# Patient Record
Sex: Male | Born: 1950 | Race: White | Hispanic: No | Marital: Married | Smoking: Never smoker
Health system: Southern US, Community
[De-identification: ages and names within clinical notes are randomized; demographics above are authoritative.]

## PROBLEM LIST (undated history)

## (undated) DIAGNOSIS — I10 Essential (primary) hypertension: Secondary | ICD-10-CM

## (undated) DIAGNOSIS — Z950 Presence of cardiac pacemaker: Secondary | ICD-10-CM

## (undated) DIAGNOSIS — E119 Type 2 diabetes mellitus without complications: Secondary | ICD-10-CM

## (undated) HISTORY — PX: STOMACH SURGERY: SHX791

## (undated) HISTORY — PX: BACK SURGERY: SHX140

## (undated) NOTE — *Deleted (*Deleted)
Transition of Care (TOC) - Progression Note    Patient Details  Name: Kevin [name]. MRN: [id number] Date of Birth: 26-Aug-1950  Transition of Care [facility]) CM/SW Contact  Patrice [place], [place] Phone Number: [phone] 12/10/2019, 4:03 PM  Clinical Narrative:     CSW spoke with patient in regards to the 3 bed offers that have come back. Patient explained that he wanted to speak with wife and asked if CSW could follow sf    Barriers to Discharge: Continued Medical Work up  Expected Discharge Plan and Services         Living arrangements for the past 2 months: Single Family Home                                       Social Determinants of Health (SDOH) Interventions    Readmission Risk Interventions No flowsheet data found.

---

## 2004-11-13 ENCOUNTER — Encounter: Admission: RE | Admit: 2004-11-13 | Discharge: 2005-02-11 | Payer: Self-pay | Admitting: Family Medicine

## 2004-11-18 ENCOUNTER — Ambulatory Visit (HOSPITAL_COMMUNITY): Admission: RE | Admit: 2004-11-18 | Discharge: 2004-11-18 | Payer: Self-pay | Admitting: Family Medicine

## 2004-11-25 ENCOUNTER — Ambulatory Visit (HOSPITAL_COMMUNITY): Admission: RE | Admit: 2004-11-25 | Discharge: 2004-11-25 | Payer: Self-pay | Admitting: Gastroenterology

## 2004-12-27 ENCOUNTER — Ambulatory Visit (HOSPITAL_COMMUNITY): Admission: RE | Admit: 2004-12-27 | Discharge: 2004-12-27 | Payer: Self-pay | Admitting: General Surgery

## 2005-02-03 ENCOUNTER — Emergency Department (HOSPITAL_COMMUNITY): Admission: EM | Admit: 2005-02-03 | Discharge: 2005-02-03 | Payer: Self-pay | Admitting: Emergency Medicine

## 2005-02-23 ENCOUNTER — Ambulatory Visit (HOSPITAL_BASED_OUTPATIENT_CLINIC_OR_DEPARTMENT_OTHER): Admission: RE | Admit: 2005-02-23 | Discharge: 2005-02-23 | Payer: Self-pay | Admitting: Otolaryngology

## 2005-03-02 ENCOUNTER — Ambulatory Visit: Payer: Self-pay | Admitting: Internal Medicine

## 2005-09-24 ENCOUNTER — Ambulatory Visit (HOSPITAL_BASED_OUTPATIENT_CLINIC_OR_DEPARTMENT_OTHER): Admission: RE | Admit: 2005-09-24 | Discharge: 2005-09-24 | Payer: Self-pay | Admitting: Orthopedic Surgery

## 2005-09-30 ENCOUNTER — Encounter: Admission: RE | Admit: 2005-09-30 | Discharge: 2005-09-30 | Payer: Self-pay | Admitting: Orthopedic Surgery

## 2011-07-24 ENCOUNTER — Emergency Department (HOSPITAL_COMMUNITY)
Admission: EM | Admit: 2011-07-24 | Discharge: 2011-07-24 | Disposition: A | Discharge status: Home / Self Care | Payer: Managed Care, Other (non HMO) | Attending: Emergency Medicine | Admitting: Emergency Medicine

## 2011-07-24 ENCOUNTER — Emergency Department (HOSPITAL_COMMUNITY): Payer: Managed Care, Other (non HMO)

## 2011-07-24 ENCOUNTER — Encounter (HOSPITAL_COMMUNITY): Payer: Self-pay | Admitting: Emergency Medicine

## 2011-07-24 DIAGNOSIS — Y92009 Unspecified place in unspecified non-institutional (private) residence as the place of occurrence of the external cause: Secondary | ICD-10-CM | POA: Insufficient documentation

## 2011-07-24 DIAGNOSIS — S61509A Unspecified open wound of unspecified wrist, initial encounter: Secondary | ICD-10-CM | POA: Insufficient documentation

## 2011-07-24 DIAGNOSIS — W540XXA Bitten by dog, initial encounter: Secondary | ICD-10-CM | POA: Insufficient documentation

## 2011-07-24 DIAGNOSIS — S51809A Unspecified open wound of unspecified forearm, initial encounter: Secondary | ICD-10-CM | POA: Insufficient documentation

## 2011-07-24 MED ORDER — OXYCODONE-ACETAMINOPHEN 5-325 MG PO TABS
2.0000 | ORAL_TABLET | Freq: Once | ORAL | Status: AC
  Administered 2011-07-24: 2 via ORAL
  Filled 2011-07-24: qty 2

## 2011-07-24 MED ORDER — DOXYCYCLINE HYCLATE 100 MG PO CAPS: 100.0000 mg | ORAL_CAPSULE | Freq: Two times a day (BID) | ORAL | Status: AC

## 2011-07-24 MED ORDER — IBUPROFEN 800 MG PO TABS
800.0000 mg | ORAL_TABLET | Freq: Once | ORAL | Status: AC
  Administered 2011-07-24: 800 mg via ORAL
  Filled 2011-07-24: qty 1

## 2011-07-24 MED ORDER — TETANUS-DIPHTH-ACELL PERTUSSIS 5-2.5-18.5 LF-MCG/0.5 IM SUSP
INTRAMUSCULAR | Status: AC
  Filled 2011-07-24: qty 0.5

## 2011-07-24 MED ORDER — TETANUS-DIPHTH-ACELL PERTUSSIS 5-2.5-18.5 LF-MCG/0.5 IM SUSP
0.5000 mL | Freq: Once | INTRAMUSCULAR | Status: AC
  Administered 2011-07-24: 0.5 mL via INTRAMUSCULAR

## 2011-07-24 MED ORDER — HYDROCODONE-ACETAMINOPHEN 5-325 MG PO TABS: 1.0000 | ORAL_TABLET | ORAL | Status: AC | PRN

## 2011-07-24 NOTE — Discharge Instructions (Signed)
Stab Wound A stab wound can cause infection, bleeding and damage to organs and tissues in the area of the wound. Care must be taken for complete recovery. Much of the time stab wounds can be treated by cleaning them and applying a sterile dressing. Sutures, skin adhesive strips or staples may be used to close some stab wounds.  HOME CARE INSTRUCTIONS  Rest your injury for the next 2-3 days to reduce pain and lessen the risk of infection.   Keep your wound clean and dry and dress as instructed by your caregiver.  You might need a tetanus shot now if:  You have no idea when you had the last one.   You have never had a tetanus shot before.   Your wound had dirt in it.  If you need a tetanus shot, and you choose not to get one, there is a rare chance of getting tetanus. Sickness from tetanus can be serious. If you got a tetanus shot, your arm may swell, get red and warm to the touch at the shot site. This is common and not a problem. SEEK IMMEDIATE MEDICAL CARE IF:  You develop increasing pain, redness or swelling around the wound.   You have nausea or vomiting.   There is increased bleeding from the wound.   You develop numbness or weakness in the injured area. This can be due to damage to an underlying nerve or tendon.   There is a pus-like discharge from the wound.   You have chills or an elevated temperature above 102 F (38.9 C).   You have shortness of breath or fainting.  Document Released: 04/24/2004 Document Revised: 03/06/2011 Document Reviewed: 01/05/2008 [facility] Patient Information 2012 [place], [place].Animal Bite An animal bite can result in a scratch on the skin, deep open cut, puncture of the skin, crush injury, or tearing away of the skin or a body part. Dogs are responsible for most animal bites. Children are bitten more often than adults. An animal bite can range from very mild to more serious. A small bite from your house pet is no cause for alarm. However, some  animal bites can become infected or injure a bone or other tissue. You must seek medical care if:  The skin is broken and bleeding does not slow down or stop after 15 minutes.   The puncture is deep and difficult to clean (such as a cat bite).   Pain, warmth, redness, or pus develops around the wound.   The bite is from a stray animal or rodent. There may be a risk of rabies infection.   The bite is from a snake, raccoon, skunk, fox, coyote, or bat. There may be a risk of rabies infection.   The person bitten has a chronic illness such as diabetes, liver disease, or cancer, or the person takes medicine that lowers the immune system.   There is concern about the location and severity of the bite.  It is important to clean and protect an animal bite wound right away to prevent infection. Follow these steps:  Clean the wound with plenty of water and soap.   Apply an antibiotic cream.   Apply gentle pressure over the wound with a clean towel or gauze to slow or stop bleeding.   Elevate the affected area above the heart to help stop any bleeding.   Seek medical care. Getting medical care within 8 hours of the animal bite leads to the best possible outcome.  DIAGNOSIS  Your caregiver  will most likely:  Take a detailed history of the animal and the bite injury.   Perform a wound exam.   Take your medical history.  Blood tests or X-rays may be performed. Sometimes, infected bite wounds are cultured and sent to a lab to identify the infectious bacteria.  TREATMENT  Medical treatment will depend on the location and type of animal bite as well as the patient's medical history. Treatment may include:  Wound care, such as cleaning and flushing the wound with saline solution, bandaging, and elevating the affected area.   Antibiotics.   Tetanus immunization.   Rabies immunization.   Leaving the wound open to heal. This is often done with animal bites, due to the high risk of infection.  However, in certain cases, wound closure with stitches, wound adhesive, skin adhesive strips, or staples may be used.  Infected bites that are left untreated may require intravenous (IV) antibiotics and surgical treatment in the hospital. HOME CARE INSTRUCTIONS  Follow your caregiver's instructions for wound care.   Take all medicines as directed.   If your caregiver prescribes antibiotics, take them as directed. Finish them even if you start to feel better.   Follow up with your caregiver for further exams or immunizations as directed.  You may need a tetanus shot if:  You cannot remember when you had your last tetanus shot.   You have never had a tetanus shot.   The injury broke your skin.  If you get a tetanus shot, your arm may swell, get red, and feel warm to the touch. This is common and not a problem. If you need a tetanus shot and you choose not to have one, there is a rare chance of getting tetanus. Sickness from tetanus can be serious. SEEK MEDICAL CARE IF:  You notice warmth, redness, soreness, swelling, pus discharge, or a bad smell coming from the wound.   You have a red line on the skin coming from the wound.   You have a fever, chills, or a general ill feeling.   You have nausea or vomiting.   You have continued or worsening pain.   You have trouble moving the injured part.   You have other questions or concerns.  MAKE SURE YOU:  Understand these instructions.   Will watch your condition.   Will get help right away if you are not doing well or get worse.  Document Released: 12/03/2010 Document Revised: 03/06/2011 Document Reviewed: 12/03/2010 [facility] Patient Information 2012 [place], [place].Laceration Care, Adult A laceration is a cut or lesion that goes through all layers of the skin and into the tissue just beneath the skin. TREATMENT  Some lacerations may not require closure. Some lacerations may not be able to be closed due to an increased risk of  infection. It is important to see your caregiver as soon as possible after an injury to minimize the risk of infection and maximize the opportunity for successful closure. If closure is appropriate, pain medicines may be given, if needed. The wound will be cleaned to help prevent infection. Your caregiver will use stitches (sutures), staples, wound glue (adhesive), or skin adhesive strips to repair the laceration. These tools bring the skin edges together to allow for faster healing and a better cosmetic outcome. However, all wounds will heal with a scar. Once the wound has healed, scarring can be minimized by covering the wound with sunscreen during the day for 1 full year. HOME CARE INSTRUCTIONS  For sutures or staples:  Keep the wound clean and dry.   If you were given a bandage (dressing), you should change it at least once a day. Also, change the dressing if it becomes wet or dirty, or as directed by your caregiver.   Wash the wound with soap and water 2 times a day. Rinse the wound off with water to remove all soap. Pat the wound dry with a clean towel.   After cleaning, apply a thin layer of the antibiotic ointment as recommended by your caregiver. This will help prevent infection and keep the dressing from sticking.   You may shower as usual after the first 24 hours. Do not soak the wound in water until the sutures are removed.   Only take over-the-counter or prescription medicines for pain, discomfort, or fever as directed by your caregiver.   Get your sutures or staples removed as directed by your caregiver.  For skin adhesive strips:  Keep the wound clean and dry.   Do not get the skin adhesive strips wet. You may bathe carefully, using caution to keep the wound dry.   If the wound gets wet, pat it dry with a clean towel.   Skin adhesive strips will fall off on their own. You may trim the strips as the wound heals. Do not remove skin adhesive strips that are still stuck to the  wound. They will fall off in time.  For wound adhesive:  You may briefly wet your wound in the shower or bath. Do not soak or scrub the wound. Do not swim. Avoid periods of heavy perspiration until the skin adhesive has fallen off on its own. After showering or bathing, gently pat the wound dry with a clean towel.   Do not apply liquid medicine, cream medicine, or ointment medicine to your wound while the skin adhesive is in place. This may loosen the film before your wound is healed.   If a dressing is placed over the wound, be careful not to apply tape directly over the skin adhesive. This may cause the adhesive to be pulled off before the wound is healed.   Avoid prolonged exposure to sunlight or tanning lamps while the skin adhesive is in place. Exposure to ultraviolet light in the first year will darken the scar.   The skin adhesive will usually remain in place for 5 to 10 days, then naturally fall off the skin. Do not pick at the adhesive film.  You may need a tetanus shot if:  You cannot remember when you had your last tetanus shot.   You have never had a tetanus shot.  If you get a tetanus shot, your arm may swell, get red, and feel warm to the touch. This is common and not a problem. If you need a tetanus shot and you choose not to have one, there is a rare chance of getting tetanus. Sickness from tetanus can be serious. SEEK MEDICAL CARE IF:   You have redness, swelling, or increasing pain in the wound.   You see a red line that goes away from the wound.   You have yellowish-white fluid (pus) coming from the wound.   You have a fever.   You notice a bad smell coming from the wound or dressing.   Your wound breaks open before or after sutures have been removed.   You notice something coming out of the wound such as wood or glass.   Your wound is on your hand or foot and you cannot  move a finger or toe.  SEEK IMMEDIATE MEDICAL CARE IF:   Your pain is not controlled with  prescribed medicine.   You have severe swelling around the wound causing pain and numbness or a change in color in your arm, hand, leg, or foot.   Your wound splits open and starts bleeding.   You have worsening numbness, weakness, or loss of function of any joint around or beyond the wound.   You develop painful lumps near the wound or on the skin anywhere on your body.  MAKE SURE YOU:   Understand these instructions.   Will watch your condition.   Will get help right away if you are not doing well or get worse.  Document Released: 03/17/2005 Document Revised: 03/06/2011 Document Reviewed: 09/10/2010 [facility] Patient Information 2012 [place], [place].

## 2011-07-24 NOTE — ED Provider Notes (Signed)
History     CSN: [id number]  Arrival date & time 07/24/11  1642   First MD Initiated Contact with Patient 07/24/11 1748      No chief complaint on file.   (Consider location/radiation/quality/duration/timing/severity/associated sxs/prior treatment) HPI  Patient presents to the ED with complaints of being attacked by a dog. The dog is his friends who is out of town for two weeks,. The dog is not up to date on his shots. The patient called animal control to come get the dog so that it can be quarantined for rabies. He states that he went to the house and the dog had broke free from his chain and was scared. He was trying to feed the dog when it attacked him. He received multiple puncture wounds to his right wrist and left forearm, as well as an abrasion to his right abdominal area. Bleeding is controlled at this time. The pain is moderate and is not associated with any other symptoms. The incident happened just prior to arrival. NAD and VSS.   No past medical history on file.  Past Surgical History  Procedure Date  . Back surgery   . Stomach surgery     History reviewed. No pertinent family history.  History  Substance Use Topics  . Smoking status: Not on file  . Smokeless tobacco: Not on file  . Alcohol Use:       Review of Systems   HEENT: denies blurry vision or change in hearing PULMONARY: Denies difficulty breathing and SOB CARDIAC: denies chest pain or heart palpitations MUSCULOSKELETAL:  denies being unable to ambulate ABDOMEN AL: denies abdominal pain GU: denies loss of bowel or urinary control NEURO: denies numbness and tingling in extremities   Allergies  Penicillins and Percodan  Home Medications   Current Outpatient Rx  Name Route Sig Dispense Refill  . IBUPROFEN 200 MG PO TABS Oral Take 600-800 mg by mouth every 8 (eight) hours as needed. For pain.    Marland Kitchen DOXYCYCLINE HYCLATE 100 MG PO CAPS Oral Take 1 capsule (100 mg total) by mouth 2 (two) times  daily. 20 capsule 0  . HYDROCODONE-ACETAMINOPHEN 5-325 MG PO TABS Oral Take 1 tablet by mouth every 4 (four) hours as needed for pain. 6 tablet 0    BP 140/88  Pulse 91  Temp(Src) 98 F (36.7 C) (Oral)  Resp 18  Ht 6' (1.829 m)  Wt 300 lb (136.079 kg)  BMI 40.69 kg/m2  SpO2 95%  Physical Exam  Nursing note and vitals reviewed. Constitutional: He appears well-developed and well-nourished. No distress.  HENT:  Head: Normocephalic and atraumatic.  Eyes: Pupils are equal, round, and reactive to light.  Neck: Normal range of motion. Neck supple.  Cardiovascular: Normal rate and regular rhythm.   Pulmonary/Chest: Effort normal.  Abdominal: Soft.  Musculoskeletal:       Arms:      Hands: Neurological: He is alert.  Skin: Skin is warm and dry.    ED Course  Procedures (including critical care time)  Labs Reviewed - No data to display Dg Forearm Left  07/24/2011  *RADIOLOGY REPORT*  Clinical Data: Dog bite with laceration  LEFT FOREARM - 2 VIEW  Comparison: None.  Findings: Negative for fracture.  No foreign body is identified. Small laceration is noted.  IMPRESSION: Negative for fracture or foreign body.  Original Report Authenticated By: Camelia Phenes, M.D.   Dg Wrist Complete Right  07/24/2011  *RADIOLOGY REPORT*  Clinical Data: Dog bite base of  thumb and wrist  RIGHT WRIST - COMPLETE 3+ VIEW  Comparison: None.  Findings: Negative for fracture.  No foreign bodies identified. Mild degenerative changes in the wrist.  IMPRESSION: No acute abnormality.  Original Report Authenticated By: Camelia Phenes, M.D.     1. Dog bite       MDM  Pts wounds topically cleaned with betadine. Then each wound was irrigated with normal saline using a syringe and IV catheter. No foreign bodies removed no red flags symptoms noted. Pt started on doxycycline to prevent infection. Pt referred to [facility] the Hand Doctor for follow-up. Instructions on wound care given to patient and he voices  understanding on signs and symptoms to watch for which warrant return to ED.  Pt will begin rabies series if dog can not be quarantined by tomorrow.   Pt has been advised of the symptoms that warrant their return to the ED. Patient has voiced understanding and has agreed to follow-up with the PCP or specialist.         Dorthula Matas, PA 07/24/11 [phone]

## 2011-07-28 NOTE — ED Provider Notes (Signed)
Medical screening examination/treatment/procedure(s) were performed by non-physician practitioner and as supervising physician I was immediately available for consultation/collaboration.   Mystic Labo, MD 07/28/11 0733 

## 2013-05-23 DIAGNOSIS — G4733 Obstructive sleep apnea (adult) (pediatric): Secondary | ICD-10-CM | POA: Insufficient documentation

## 2013-06-06 DIAGNOSIS — I10 Essential (primary) hypertension: Secondary | ICD-10-CM | POA: Insufficient documentation

## 2013-09-06 DIAGNOSIS — Z9109 Other allergy status, other than to drugs and biological substances: Secondary | ICD-10-CM | POA: Insufficient documentation

## 2013-09-22 DIAGNOSIS — M1990 Unspecified osteoarthritis, unspecified site: Secondary | ICD-10-CM | POA: Insufficient documentation

## 2013-09-26 DIAGNOSIS — E291 Testicular hypofunction: Secondary | ICD-10-CM | POA: Insufficient documentation

## 2014-05-05 DIAGNOSIS — R4189 Other symptoms and signs involving cognitive functions and awareness: Secondary | ICD-10-CM | POA: Insufficient documentation

## 2014-10-10 DIAGNOSIS — E785 Hyperlipidemia, unspecified: Secondary | ICD-10-CM | POA: Insufficient documentation

## 2016-08-01 DIAGNOSIS — I77819 Aortic ectasia, unspecified site: Secondary | ICD-10-CM | POA: Insufficient documentation

## 2016-08-01 DIAGNOSIS — I700 Atherosclerosis of aorta: Secondary | ICD-10-CM | POA: Insufficient documentation

## 2017-02-27 DIAGNOSIS — R0789 Other chest pain: Secondary | ICD-10-CM | POA: Insufficient documentation

## 2017-02-27 DIAGNOSIS — G43109 Migraine with aura, not intractable, without status migrainosus: Secondary | ICD-10-CM | POA: Insufficient documentation

## 2017-03-06 DIAGNOSIS — R7989 Other specified abnormal findings of blood chemistry: Secondary | ICD-10-CM | POA: Insufficient documentation

## 2017-04-04 ENCOUNTER — Other Ambulatory Visit: Payer: Self-pay

## 2017-04-04 ENCOUNTER — Emergency Department (HOSPITAL_BASED_OUTPATIENT_CLINIC_OR_DEPARTMENT_OTHER)
Admission: EM | Admit: 2017-04-04 | Discharge: 2017-04-04 | Disposition: A | Discharge status: Home / Self Care | Payer: Medicare Other | Attending: Emergency Medicine | Admitting: Emergency Medicine

## 2017-04-04 ENCOUNTER — Encounter (HOSPITAL_BASED_OUTPATIENT_CLINIC_OR_DEPARTMENT_OTHER): Payer: Self-pay | Admitting: Emergency Medicine

## 2017-04-04 ENCOUNTER — Emergency Department (HOSPITAL_BASED_OUTPATIENT_CLINIC_OR_DEPARTMENT_OTHER): Payer: Medicare Other

## 2017-04-04 DIAGNOSIS — M79605 Pain in left leg: Secondary | ICD-10-CM | POA: Diagnosis present

## 2017-04-04 DIAGNOSIS — E119 Type 2 diabetes mellitus without complications: Secondary | ICD-10-CM | POA: Diagnosis not present

## 2017-04-04 DIAGNOSIS — I10 Essential (primary) hypertension: Secondary | ICD-10-CM | POA: Insufficient documentation

## 2017-04-04 DIAGNOSIS — Z79899 Other long term (current) drug therapy: Secondary | ICD-10-CM | POA: Diagnosis not present

## 2017-04-04 DIAGNOSIS — Z7984 Long term (current) use of oral hypoglycemic drugs: Secondary | ICD-10-CM | POA: Insufficient documentation

## 2017-04-04 DIAGNOSIS — Z7982 Long term (current) use of aspirin: Secondary | ICD-10-CM | POA: Insufficient documentation

## 2017-04-04 DIAGNOSIS — L03116 Cellulitis of left lower limb: Secondary | ICD-10-CM | POA: Diagnosis not present

## 2017-04-04 HISTORY — DX: Essential (primary) hypertension: I10

## 2017-04-04 HISTORY — DX: Type 2 diabetes mellitus without complications: E11.9

## 2017-04-04 LAB — CBC WITH DIFFERENTIAL/PLATELET
Basophils Absolute: 0 10*3/uL (ref 0.0–0.1)
Basophils Relative: 1 %
Eosinophils Absolute: 0.3 10*3/uL (ref 0.0–0.7)
Eosinophils Relative: 3 %
HCT: 42.3 % (ref 39.0–52.0)
Hemoglobin: 13.7 g/dL (ref 13.0–17.0)
Lymphocytes Relative: 23 %
Lymphs Abs: 2 10*3/uL (ref 0.7–4.0)
MCH: 30.6 pg (ref 26.0–34.0)
MCHC: 32.4 g/dL (ref 30.0–36.0)
MCV: 94.4 fL (ref 78.0–100.0)
Monocytes Absolute: 0.9 10*3/uL (ref 0.1–1.0)
Monocytes Relative: 10 %
Neutro Abs: 5.4 10*3/uL (ref 1.7–7.7)
Neutrophils Relative %: 63 %
Platelets: 317 10*3/uL (ref 150–400)
RBC: 4.48 MIL/uL (ref 4.22–5.81)
RDW: 14.3 % (ref 11.5–15.5)
WBC: 8.5 10*3/uL (ref 4.0–10.5)

## 2017-04-04 LAB — URINALYSIS, ROUTINE W REFLEX MICROSCOPIC
Bilirubin Urine: NEGATIVE
Glucose, UA: NEGATIVE mg/dL
Hgb urine dipstick: NEGATIVE
Ketones, ur: 15 mg/dL — AB
Leukocytes, UA: NEGATIVE
Nitrite: NEGATIVE
Protein, ur: NEGATIVE mg/dL
Specific Gravity, Urine: 1.02 (ref 1.005–1.030)
pH: 6 (ref 5.0–8.0)

## 2017-04-04 LAB — BASIC METABOLIC PANEL
Anion gap: 7 (ref 5–15)
BUN: 12 mg/dL (ref 6–20)
CO2: 26 mmol/L (ref 22–32)
Calcium: 8.8 mg/dL — ABNORMAL LOW (ref 8.9–10.3)
Chloride: 101 mmol/L (ref 101–111)
Creatinine, Ser: 0.87 mg/dL (ref 0.61–1.24)
GFR calc Af Amer: >60 mL/min (ref >60)
GFR calc non Af Amer: >60 mL/min (ref >60)
Glucose, Bld: 113 mg/dL — ABNORMAL HIGH (ref 65–99)
Potassium: 3.7 mmol/L (ref 3.5–5.1)
Sodium: 134 mmol/L — ABNORMAL LOW (ref 135–145)

## 2017-04-04 MED ORDER — CEPHALEXIN 500 MG PO CAPS: 500.0000 mg | ORAL_CAPSULE | Freq: Four times a day (QID) | ORAL | 0 refills | Status: AC

## 2017-04-04 NOTE — ED Notes (Signed)
ED Provider at bedside. 

## 2017-04-04 NOTE — ED Provider Notes (Signed)
[facility] EMERGENCY DEPARTMENT Provider Note   CSN: [id number] Arrival date & time: 04/04/17  1431     History   Chief Complaint Chief Complaint  Patient presents with  . Leg Pain    HPI Kevin [name]. is a 67 y.o. male.  The history is provided by the patient. No language interpreter was used.  Leg Pain      Kevin [name]. is a 67 y.o. male who presents to the Emergency Department complaining of leg pain.  He reports 3 days of pain and swelling to the left leg.  He denies any initial injuries but he did injure it after the pain, swelling and redness developed.  After the leg became painful and swollen and he hit it on a hand truck.  No reports of fevers but it feels very warm in the leg.  He has been dealing with chronic bronchitis and cough for several months.  No vomiting, diarrhea.  He has mild sore throat.  Symptoms are moderate and constant in nature.  No prior similar symptoms.  He takes a baby aspirin daily.  He does have a history of diabetes and high blood pressure.  Past Medical History:  Diagnosis Date  . Diabetes mellitus without complication (HCC)   . Hypertension     There are no active problems to display for this patient.   Past Surgical History:  Procedure Laterality Date  . BACK SURGERY    . STOMACH SURGERY         Home Medications    Prior to Admission medications   Medication Sig Start Date End Date Taking? Authorizing Provider  amlodipine-benazepril (LOTREL) 2.5-10 MG capsule Take 1 capsule by mouth daily.   Yes [provider]  aspirin EC 81 MG tablet Take 81 mg by mouth daily.   Yes [provider]  atorvastatin (LIPITOR) 40 MG tablet Take 40 mg by mouth daily.   Yes [provider]  metFORMIN (GLUCOPHAGE) 500 MG tablet Take by mouth 2 (two) times daily with a meal.   Yes [provider]  omeprazole (PRILOSEC) 20 MG capsule Take 20 mg by mouth daily.   Yes [provider]    tadalafil (CIALIS) 10 MG tablet Take 10 mg by mouth daily as needed for erectile dysfunction.   Yes [provider]  cephALEXin (KEFLEX) 500 MG capsule Take 1 capsule (500 mg total) by mouth 4 (four) times daily for 7 days. 04/04/17 04/11/17  Tilden Fossa, MD  ibuprofen (ADVIL,MOTRIN) 200 MG tablet Take 600-800 mg by mouth every 8 (eight) hours as needed. For pain.    [provider]    Family History No family history on file.  Social History Social History   Tobacco Use  . Smoking status: Never Smoker  . Smokeless tobacco: Never Used  Substance Use Topics  . Alcohol use: No    Frequency: Never  . Drug use: Not on file     Allergies   Penicillins and Percodan [oxycodone-aspirin]   Review of Systems Review of Systems  All other systems reviewed and are negative.    Physical Exam Updated Vital Signs BP (!) 147/76 (BP Location: Right Arm)   Pulse 62   Temp 98.7 F (37.1 C) (Oral)   Resp 20   Ht 5\' 10"  (1.778 m)   Wt 133.8 kg (295 lb)   SpO2 100%   BMI 42.33 kg/m   Physical Exam  Constitutional: He is oriented to person, place,  and time. He appears well-developed and well-nourished.  HENT:  Head: Normocephalic and atraumatic.  Cardiovascular: Normal rate and regular rhythm.  Pulmonary/Chest: Effort normal and breath sounds normal. No respiratory distress.  Abdominal: Soft. There is no tenderness. There is no rebound and no guarding.  Musculoskeletal:  1+ pitting edema to the right lower extremity.  2+ pitting edema to the left lower extremity.  There is erythema and tenderness throughout the left shin and calf.  There is no erythema to the foot.  Neurological: He is alert and oriented to person, place, and time.  Skin: Skin is warm and dry.  Psychiatric: He has a normal mood and affect. His behavior is normal.  Nursing note and vitals reviewed.    ED Treatments / Results  Labs (all labs ordered are listed, but only abnormal results are  displayed) Labs Reviewed  BASIC METABOLIC PANEL - Abnormal; Notable for the following components:      Result Value   Sodium 134 (*)    Glucose, Bld 113 (*)    Calcium 8.8 (*)    All other components within normal limits  URINALYSIS, ROUTINE W REFLEX MICROSCOPIC - Abnormal; Notable for the following components:   Ketones, ur 15 (*)    All other components within normal limits  CBC WITH DIFFERENTIAL/PLATELET    EKG  EKG Interpretation None       Radiology [place] Venous Img Lower Unilateral Left  Result Date: 04/04/2017 CLINICAL DATA:  LEFT lower leg discomfort, swelling, and redness for 3-4 days, also struck LEFT she in against trailer hitch 2 days ago, history hypertension, diabetes mellitus, obesity EXAM: LEFT LOWER EXTREMITY VENOUS DOPPLER ULTRASOUND TECHNIQUE: Gray-scale sonography with graded compression, as well as color Doppler and duplex ultrasound were performed to evaluate the lower extremity deep venous systems from the level of the common femoral vein and including the common femoral, femoral, profunda femoral, popliteal and calf veins including the posterior tibial, peroneal and gastrocnemius veins when visible. The superficial great saphenous vein was also interrogated. Spectral Doppler was utilized to evaluate flow at rest and with distal augmentation maneuvers in the common femoral, femoral and popliteal veins. COMPARISON:  09/30/2005 FINDINGS: Contralateral Common Femoral Vein: Respiratory phasicity is normal and symmetric with the symptomatic side. No evidence of thrombus. Normal compressibility. Common Femoral Vein: No evidence of thrombus. Normal compressibility, respiratory phasicity and response to augmentation. Saphenofemoral Junction: No evidence of thrombus. Normal compressibility and flow on color Doppler imaging. Profunda Femoral Vein: No evidence of thrombus. Normal compressibility and flow on color Doppler imaging. Femoral Vein: No evidence of thrombus. Normal  compressibility, respiratory phasicity and response to augmentation. Popliteal Vein: No evidence of thrombus. Normal compressibility, respiratory phasicity and response to augmentation. Calf Veins: Inadequate visualization of calf veins due to body habitus and soft tissue swelling. Superficial Great Saphenous Vein: No evidence of thrombus. Normal compressibility. Venous Reflux:  None. Other Findings:  Scattered soft tissue edema at calf. IMPRESSION: No evidence of deep venous thrombosis in the LEFT lower extremity. Electronically Signed   By: Ulyses SouthwardMark  Boles M.D.   On: 04/04/2017 16:05    Procedures Procedures (including critical care time)  Medications Ordered in ED Medications - No data to display   Initial Impression / Assessment and Plan / ED Course  I have reviewed the triage vital signs and the nursing notes.  Pertinent labs & imaging results that were available during my care of the patient were reviewed by me and considered in my medical decision making (see  chart for details).     Patient here with swelling and pain to the left leg.  He is well perfused on examination with no evidence of acute DVT, limb ischemia, CHF, abscess.  Examination is concerning for developing cellulitis.  He has no concerning features for sepsis.  Counseled patient on home care for cellulitis.  Discussed outpatient follow-up as well as return precautions.  Final Clinical Impressions(s) / ED Diagnoses   Final diagnoses:  Cellulitis of left lower extremity    ED Discharge Orders        Ordered    cephALEXin (KEFLEX) 500 MG capsule  4 times daily     04/04/17 1856       Tilden Fossa, MD 04/04/17 1905

## 2017-04-04 NOTE — ED Triage Notes (Signed)
L leg pain and swelling x 3 days. Redness noted, hot to touch.

## 2018-03-17 DIAGNOSIS — Z789 Other specified health status: Secondary | ICD-10-CM | POA: Insufficient documentation

## 2018-03-17 DIAGNOSIS — L853 Xerosis cutis: Secondary | ICD-10-CM | POA: Insufficient documentation

## 2018-08-30 DIAGNOSIS — J45909 Unspecified asthma, uncomplicated: Secondary | ICD-10-CM | POA: Insufficient documentation

## 2018-10-17 DIAGNOSIS — Z6841 Body Mass Index (BMI) 40.0 and over, adult: Secondary | ICD-10-CM | POA: Insufficient documentation

## 2019-04-22 DIAGNOSIS — I441 Atrioventricular block, second degree: Secondary | ICD-10-CM | POA: Insufficient documentation

## 2019-05-02 HISTORY — PX: INSERT / REPLACE / REMOVE PACEMAKER: SUR710

## 2019-05-13 ENCOUNTER — Encounter (HOSPITAL_BASED_OUTPATIENT_CLINIC_OR_DEPARTMENT_OTHER): Payer: Self-pay | Admitting: Emergency Medicine

## 2019-05-13 ENCOUNTER — Emergency Department (HOSPITAL_BASED_OUTPATIENT_CLINIC_OR_DEPARTMENT_OTHER)
Admission: EM | Admit: 2019-05-13 | Discharge: 2019-05-13 | Disposition: A | Discharge status: Home / Self Care | Payer: Medicare Other | Attending: Emergency Medicine | Admitting: Emergency Medicine

## 2019-05-13 ENCOUNTER — Other Ambulatory Visit: Payer: Self-pay

## 2019-05-13 DIAGNOSIS — Z7984 Long term (current) use of oral hypoglycemic drugs: Secondary | ICD-10-CM | POA: Insufficient documentation

## 2019-05-13 DIAGNOSIS — Z880 Allergy status to penicillin: Secondary | ICD-10-CM | POA: Diagnosis not present

## 2019-05-13 DIAGNOSIS — T783XXA Angioneurotic edema, initial encounter: Secondary | ICD-10-CM

## 2019-05-13 DIAGNOSIS — Z79899 Other long term (current) drug therapy: Secondary | ICD-10-CM | POA: Diagnosis not present

## 2019-05-13 DIAGNOSIS — I10 Essential (primary) hypertension: Secondary | ICD-10-CM | POA: Diagnosis not present

## 2019-05-13 DIAGNOSIS — R220 Localized swelling, mass and lump, head: Secondary | ICD-10-CM | POA: Diagnosis present

## 2019-05-13 DIAGNOSIS — Z885 Allergy status to narcotic agent status: Secondary | ICD-10-CM | POA: Insufficient documentation

## 2019-05-13 DIAGNOSIS — T464X5A Adverse effect of angiotensin-converting-enzyme inhibitors, initial encounter: Secondary | ICD-10-CM

## 2019-05-13 DIAGNOSIS — Z7982 Long term (current) use of aspirin: Secondary | ICD-10-CM | POA: Insufficient documentation

## 2019-05-13 DIAGNOSIS — E119 Type 2 diabetes mellitus without complications: Secondary | ICD-10-CM | POA: Insufficient documentation

## 2019-05-13 LAB — CBG MONITORING, ED: Glucose-Capillary: 94 mg/dL (ref 70–99)

## 2019-05-13 MED ORDER — DIPHENHYDRAMINE HCL 50 MG/ML IJ SOLN
25.0000 mg | Freq: Once | INTRAMUSCULAR | Status: AC
  Administered 2019-05-13: 25 mg via INTRAVENOUS
  Filled 2019-05-13: qty 1

## 2019-05-13 MED ORDER — SODIUM CHLORIDE 0.9 % IV SOLN: Freq: Once | INTRAVENOUS | Status: AC

## 2019-05-13 MED ORDER — AMLODIPINE BESYLATE 5 MG PO TABS: 5.0000 mg | ORAL_TABLET | Freq: Every day | ORAL | 0 refills | Status: DC

## 2019-05-13 MED ORDER — FAMOTIDINE IN NACL 20-0.9 MG/50ML-% IV SOLN
20.0000 mg | Freq: Once | INTRAVENOUS | Status: AC
  Administered 2019-05-13: 07:00:00 20 mg via INTRAVENOUS
  Filled 2019-05-13: qty 50

## 2019-05-13 MED ORDER — METHYLPREDNISOLONE SODIUM SUCC 125 MG IJ SOLR
60.0000 mg | Freq: Once | INTRAMUSCULAR | Status: AC
  Administered 2019-05-13: 07:00:00 60 mg via INTRAVENOUS
  Filled 2019-05-13: qty 2

## 2019-05-13 MED ORDER — PREDNISONE 50 MG PO TABS: 50.0000 mg | ORAL_TABLET | Freq: Every day | ORAL | 0 refills | Status: DC

## 2019-05-13 NOTE — ED Provider Notes (Signed)
Patient seen by Dr. Preston Fleeting.  Please see his note.  Patient reexamined.  He still has some lip edema but denies any tongue swelling, difficulty breathing or swallowing.  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  Plan on discharge.  Discontinue benazepril.   Linwood Dibbles, MD 05/13/19 [phone]

## 2019-05-13 NOTE — ED Triage Notes (Signed)
Patient presents with complaints of swelling to lower lip; states onset approx 0300 this am with difficulty breathing.

## 2019-05-13 NOTE — ED Provider Notes (Signed)
[facility] EMERGENCY DEPARTMENT Provider Note   CSN: [id number] Arrival date & time: 05/13/19  6948   History Chief Complaint  Patient presents with  . Allergic Reaction    Kevin [name]. is a 69 y.o. male.  The history is provided by the patient.  Allergic Reaction He has history of hypertension, diabetes and comes in because of swelling of the lower lip.  Swelling was noted at 3 AM when it woke him up.  He states that he is having a little difficulty swallowing but no difficulty breathing.  He has never had anything like this before.  He does take benazepril for blood pressure.  Past Medical History:  Diagnosis Date  . Diabetes mellitus without complication (HCC)   . Hypertension     There are no problems to display for this patient.   Past Surgical History:  Procedure Laterality Date  . BACK SURGERY    . STOMACH SURGERY         No family history on file.  Social History   Tobacco Use  . Smoking status: Never Smoker  . Smokeless tobacco: Never Used  Substance Use Topics  . Alcohol use: No  . Drug use: Not on file    Home Medications Prior to Admission medications   Medication Sig Start Date End Date Taking? Authorizing Provider  amlodipine-benazepril (LOTREL) 2.5-10 MG capsule Take 1 capsule by mouth daily.    [provider]  aspirin EC 81 MG tablet Take 81 mg by mouth daily.    [provider]  atorvastatin (LIPITOR) 40 MG tablet Take 40 mg by mouth daily.    [provider]  ibuprofen (ADVIL,MOTRIN) 200 MG tablet Take 600-800 mg by mouth every 8 (eight) hours as needed. For pain.    [provider]  metFORMIN (GLUCOPHAGE) 500 MG tablet Take by mouth 2 (two) times daily with a meal.    [provider]  omeprazole (PRILOSEC) 20 MG capsule Take 20 mg by mouth daily.    [provider]  tadalafil (CIALIS) 10 MG tablet Take 10 mg by mouth daily as needed for erectile dysfunction.    [provider]    Allergies    Penicillins and Percodan [oxycodone-aspirin]  Review of Systems   Review of Systems  All other systems reviewed and are negative.   Physical Exam Updated Vital Signs BP (!) 154/81 (BP Location: Right Arm)   Pulse 70   Temp 97.9 F (36.6 C) (Oral)   Resp 18   Ht 6' (1.829 m)   Wt 133 kg   SpO2 98%   BMI 39.77 kg/m   Physical Exam Vitals and nursing note reviewed.   69 year old male, resting comfortably and in no acute distress. Vital signs are significant for elevated blood pressure. Oxygen saturation is 98%, which is normal. Head is normocephalic and atraumatic. PERRLA, EOMI. moderate angioedema noted of the lower lip.  There is no swelling of the tongue or sublingual tissue.  There is no edema of the uvula.  There is no pooling of secretions.  Phonation is normal. Neck is nontender and supple without adenopathy or JVD. Back is nontender and there is no CVA tenderness. Lungs are clear without rales, wheezes, or rhonchi. Chest is nontender. Heart has regular rate and rhythm without murmur. Abdomen is soft, flat, nontender without masses or hepatosplenomegaly and peristalsis is normoactive. Extremities have 2+ edema, full range of motion is present. Skin is warm and dry without  rash. Neurologic: Mental status is normal, cranial nerves are intact, there are no motor or sensory deficits.  ED Results / Procedures / Treatments   Labs (all labs ordered are listed, but only abnormal results are displayed) Labs Reviewed - No data to display  Procedures Procedures  CRITICAL CARE Performed by: Delora Fuel Total critical care time: 35 minutes Critical care time was exclusive of separately billable procedures and treating other patients. Critical care was necessary to treat or prevent imminent or life-threatening deterioration. Critical care was time spent personally by me on the following activities: development of treatment plan with patient  and/or surrogate as well as nursing, discussions with consultants, evaluation of patient's response to treatment, examination of patient, obtaining history from patient or surrogate, ordering and performing treatments and interventions, ordering and review of laboratory studies, ordering and review of radiographic studies, pulse oximetry and re-evaluation of patient's condition.  Medications Ordered in ED Medications  diphenhydrAMINE (BENADRYL) injection 25 mg (has no administration in time range)  famotidine (PEPCID) IVPB 20 mg premix (has no administration in time range)  methylPREDNISolone sodium succinate (SOLU-MEDROL) 125 mg/2 mL injection 60 mg (has no administration in time range)    ED Course  I have reviewed the triage vital signs and the nursing notes.  Pertinent lab results that were available during my care of the patient were reviewed by me and considered in my medical decision making (see chart for details).  MDM Rules/Calculators/A&P ACE inhibitor induced angioedema of the lower lip.  No evidence of airway compromise at this time.  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  He is given methylprednisolone, diphenhydramine, famotidine and will be observed in the ED.  Case is signed out to Dr. Tomi Bamberger.  Final Clinical Impression(s) / ED Diagnoses Final diagnoses:  Angioedema due to angiotensin converting enzyme inhibitor (ACE-I)  Elevated blood pressure reading with diagnosis of hypertension    Rx / DC Orders ED Discharge Orders    None       Delora Fuel, MD 37/16/96 [phone]

## 2019-05-13 NOTE — Discharge Instructions (Addendum)
Stop taking your benazepril.   Follow up with your doctor to discuss alternative medications for your blood pressure.  In the interim you can take norvasc alone.  You can also take an over the counter antihistamine such as zyrtec or claritin.

## 2019-06-03 DIAGNOSIS — R001 Bradycardia, unspecified: Secondary | ICD-10-CM | POA: Insufficient documentation

## 2019-11-22 ENCOUNTER — Encounter (HOSPITAL_COMMUNITY): Payer: Self-pay | Admitting: Student

## 2019-11-22 ENCOUNTER — Other Ambulatory Visit: Payer: Self-pay

## 2019-11-22 ENCOUNTER — Emergency Department (HOSPITAL_COMMUNITY): Payer: Medicare Other

## 2019-11-22 ENCOUNTER — Inpatient Hospital Stay (HOSPITAL_COMMUNITY): Payer: Medicare Other

## 2019-11-22 ENCOUNTER — Inpatient Hospital Stay (HOSPITAL_COMMUNITY)
Admission: EM | Admit: 2019-11-22 | Discharge: 2019-12-13 | DRG: 871 | Disposition: A | Discharge status: Skilled Nursing Facility | Payer: Medicare Other | Attending: Internal Medicine | Admitting: Internal Medicine

## 2019-11-22 DIAGNOSIS — J9622 Acute and chronic respiratory failure with hypercapnia: Secondary | ICD-10-CM | POA: Diagnosis present

## 2019-11-22 DIAGNOSIS — Z803 Family history of malignant neoplasm of breast: Secondary | ICD-10-CM

## 2019-11-22 DIAGNOSIS — T827XXA Infection and inflammatory reaction due to other cardiac and vascular devices, implants and grafts, initial encounter: Secondary | ICD-10-CM | POA: Diagnosis present

## 2019-11-22 DIAGNOSIS — Z20822 Contact with and (suspected) exposure to covid-19: Secondary | ICD-10-CM | POA: Diagnosis present

## 2019-11-22 DIAGNOSIS — R7881 Bacteremia: Secondary | ICD-10-CM | POA: Diagnosis not present

## 2019-11-22 DIAGNOSIS — J811 Chronic pulmonary edema: Secondary | ICD-10-CM | POA: Diagnosis present

## 2019-11-22 DIAGNOSIS — R0602 Shortness of breath: Secondary | ICD-10-CM

## 2019-11-22 DIAGNOSIS — E8770 Fluid overload, unspecified: Secondary | ICD-10-CM | POA: Diagnosis not present

## 2019-11-22 DIAGNOSIS — R300 Dysuria: Secondary | ICD-10-CM | POA: Diagnosis not present

## 2019-11-22 DIAGNOSIS — Z880 Allergy status to penicillin: Secondary | ICD-10-CM | POA: Diagnosis not present

## 2019-11-22 DIAGNOSIS — G4733 Obstructive sleep apnea (adult) (pediatric): Secondary | ICD-10-CM | POA: Diagnosis present

## 2019-11-22 DIAGNOSIS — E1165 Type 2 diabetes mellitus with hyperglycemia: Secondary | ICD-10-CM | POA: Diagnosis present

## 2019-11-22 DIAGNOSIS — G9341 Metabolic encephalopathy: Secondary | ICD-10-CM | POA: Diagnosis present

## 2019-11-22 DIAGNOSIS — R4182 Altered mental status, unspecified: Secondary | ICD-10-CM | POA: Diagnosis present

## 2019-11-22 DIAGNOSIS — Z515 Encounter for palliative care: Secondary | ICD-10-CM

## 2019-11-22 DIAGNOSIS — Z833 Family history of diabetes mellitus: Secondary | ICD-10-CM

## 2019-11-22 DIAGNOSIS — R109 Unspecified abdominal pain: Secondary | ICD-10-CM

## 2019-11-22 DIAGNOSIS — E785 Hyperlipidemia, unspecified: Secondary | ICD-10-CM | POA: Diagnosis present

## 2019-11-22 DIAGNOSIS — I340 Nonrheumatic mitral (valve) insufficiency: Secondary | ICD-10-CM | POA: Diagnosis not present

## 2019-11-22 DIAGNOSIS — Z7189 Other specified counseling: Secondary | ICD-10-CM

## 2019-11-22 DIAGNOSIS — R197 Diarrhea, unspecified: Secondary | ICD-10-CM | POA: Diagnosis present

## 2019-11-22 DIAGNOSIS — A408 Other streptococcal sepsis: Secondary | ICD-10-CM | POA: Diagnosis present

## 2019-11-22 DIAGNOSIS — Z888 Allergy status to other drugs, medicaments and biological substances status: Secondary | ICD-10-CM

## 2019-11-22 DIAGNOSIS — E7800 Pure hypercholesterolemia, unspecified: Secondary | ICD-10-CM | POA: Diagnosis present

## 2019-11-22 DIAGNOSIS — M869 Osteomyelitis, unspecified: Secondary | ICD-10-CM | POA: Diagnosis present

## 2019-11-22 DIAGNOSIS — R531 Weakness: Secondary | ICD-10-CM

## 2019-11-22 DIAGNOSIS — J810 Acute pulmonary edema: Secondary | ICD-10-CM | POA: Diagnosis present

## 2019-11-22 DIAGNOSIS — G4700 Insomnia, unspecified: Secondary | ICD-10-CM | POA: Diagnosis present

## 2019-11-22 DIAGNOSIS — I441 Atrioventricular block, second degree: Secondary | ICD-10-CM | POA: Diagnosis present

## 2019-11-22 DIAGNOSIS — L8990 Pressure ulcer of unspecified site, unspecified stage: Secondary | ICD-10-CM | POA: Insufficient documentation

## 2019-11-22 DIAGNOSIS — N179 Acute kidney failure, unspecified: Secondary | ICD-10-CM | POA: Diagnosis present

## 2019-11-22 DIAGNOSIS — J9621 Acute and chronic respiratory failure with hypoxia: Secondary | ICD-10-CM | POA: Diagnosis present

## 2019-11-22 DIAGNOSIS — Y831 Surgical operation with implant of artificial internal device as the cause of abnormal reaction of the patient, or of later complication, without mention of misadventure at the time of the procedure: Secondary | ICD-10-CM | POA: Diagnosis present

## 2019-11-22 DIAGNOSIS — Z950 Presence of cardiac pacemaker: Secondary | ICD-10-CM | POA: Diagnosis not present

## 2019-11-22 DIAGNOSIS — L89152 Pressure ulcer of sacral region, stage 2: Secondary | ICD-10-CM | POA: Diagnosis present

## 2019-11-22 DIAGNOSIS — E860 Dehydration: Secondary | ICD-10-CM | POA: Diagnosis present

## 2019-11-22 DIAGNOSIS — Z7982 Long term (current) use of aspirin: Secondary | ICD-10-CM

## 2019-11-22 DIAGNOSIS — R5381 Other malaise: Secondary | ICD-10-CM | POA: Diagnosis not present

## 2019-11-22 DIAGNOSIS — R0600 Dyspnea, unspecified: Secondary | ICD-10-CM

## 2019-11-22 DIAGNOSIS — Z79899 Other long term (current) drug therapy: Secondary | ICD-10-CM

## 2019-11-22 DIAGNOSIS — I119 Hypertensive heart disease without heart failure: Secondary | ICD-10-CM | POA: Diagnosis present

## 2019-11-22 DIAGNOSIS — I959 Hypotension, unspecified: Secondary | ICD-10-CM | POA: Diagnosis not present

## 2019-11-22 DIAGNOSIS — R0902 Hypoxemia: Secondary | ICD-10-CM | POA: Diagnosis not present

## 2019-11-22 DIAGNOSIS — J45909 Unspecified asthma, uncomplicated: Secondary | ICD-10-CM | POA: Diagnosis present

## 2019-11-22 DIAGNOSIS — E875 Hyperkalemia: Secondary | ICD-10-CM | POA: Diagnosis not present

## 2019-11-22 DIAGNOSIS — Z6841 Body Mass Index (BMI) 40.0 and over, adult: Secondary | ICD-10-CM | POA: Diagnosis not present

## 2019-11-22 DIAGNOSIS — A419 Sepsis, unspecified organism: Secondary | ICD-10-CM

## 2019-11-22 DIAGNOSIS — J9601 Acute respiratory failure with hypoxia: Secondary | ICD-10-CM

## 2019-11-22 DIAGNOSIS — Z885 Allergy status to narcotic agent status: Secondary | ICD-10-CM | POA: Diagnosis not present

## 2019-11-22 DIAGNOSIS — G061 Intraspinal abscess and granuloma: Secondary | ICD-10-CM | POA: Diagnosis present

## 2019-11-22 DIAGNOSIS — E109 Type 1 diabetes mellitus without complications: Secondary | ICD-10-CM

## 2019-11-22 DIAGNOSIS — R6521 Severe sepsis with septic shock: Secondary | ICD-10-CM | POA: Diagnosis present

## 2019-11-22 DIAGNOSIS — K760 Fatty (change of) liver, not elsewhere classified: Secondary | ICD-10-CM | POA: Diagnosis present

## 2019-11-22 DIAGNOSIS — Z9119 Patient's noncompliance with other medical treatment and regimen: Secondary | ICD-10-CM

## 2019-11-22 DIAGNOSIS — F40240 Claustrophobia: Secondary | ICD-10-CM | POA: Diagnosis present

## 2019-11-22 DIAGNOSIS — E876 Hypokalemia: Secondary | ICD-10-CM | POA: Diagnosis not present

## 2019-11-22 DIAGNOSIS — E119 Type 2 diabetes mellitus without complications: Secondary | ICD-10-CM

## 2019-11-22 DIAGNOSIS — B9689 Other specified bacterial agents as the cause of diseases classified elsewhere: Secondary | ICD-10-CM | POA: Diagnosis not present

## 2019-11-22 DIAGNOSIS — R069 Unspecified abnormalities of breathing: Secondary | ICD-10-CM

## 2019-11-22 DIAGNOSIS — Z7984 Long term (current) use of oral hypoglycemic drugs: Secondary | ICD-10-CM

## 2019-11-22 DIAGNOSIS — E1169 Type 2 diabetes mellitus with other specified complication: Secondary | ICD-10-CM | POA: Diagnosis present

## 2019-11-22 HISTORY — DX: Presence of cardiac pacemaker: Z95.0

## 2019-11-22 LAB — URINALYSIS, ROUTINE W REFLEX MICROSCOPIC
Bilirubin Urine: NEGATIVE
Glucose, UA: NEGATIVE mg/dL
Hgb urine dipstick: NEGATIVE
Ketones, ur: NEGATIVE mg/dL
Nitrite: NEGATIVE
Protein, ur: NEGATIVE mg/dL
Specific Gravity, Urine: 1.03 (ref 1.005–1.030)
pH: 5 (ref 5.0–8.0)

## 2019-11-22 LAB — COMPREHENSIVE METABOLIC PANEL
ALT: 95 U/L — ABNORMAL HIGH (ref 0–44)
AST: 42 U/L — ABNORMAL HIGH (ref 15–41)
Albumin: 1.9 g/dL — ABNORMAL LOW (ref 3.5–5.0)
Alkaline Phosphatase: 299 U/L — ABNORMAL HIGH (ref 38–126)
Anion gap: 13 (ref 5–15)
BUN: 118 mg/dL — ABNORMAL HIGH (ref 8–23)
CO2: 24 mmol/L (ref 22–32)
Calcium: 8.7 mg/dL — ABNORMAL LOW (ref 8.9–10.3)
Chloride: 97 mmol/L — ABNORMAL LOW (ref 98–111)
Creatinine, Ser: 3.27 mg/dL — ABNORMAL HIGH (ref 0.61–1.24)
GFR calc Af Amer: 21 mL/min — ABNORMAL LOW (ref >60)
GFR calc non Af Amer: 18 mL/min — ABNORMAL LOW (ref >60)
Glucose, Bld: 174 mg/dL — ABNORMAL HIGH (ref 70–99)
Potassium: 3.3 mmol/L — ABNORMAL LOW (ref 3.5–5.1)
Sodium: 134 mmol/L — ABNORMAL LOW (ref 135–145)
Total Bilirubin: 1.5 mg/dL — ABNORMAL HIGH (ref 0.3–1.2)
Total Protein: 5.7 g/dL — ABNORMAL LOW (ref 6.5–8.1)

## 2019-11-22 LAB — CBC
HCT: 34 % — ABNORMAL LOW (ref 39.0–52.0)
Hemoglobin: 11.3 g/dL — ABNORMAL LOW (ref 13.0–17.0)
MCH: 29.3 pg (ref 26.0–34.0)
MCHC: 33.2 g/dL (ref 30.0–36.0)
MCV: 88.1 fL (ref 80.0–100.0)
Platelets: 165 10*3/uL (ref 150–400)
RBC: 3.86 MIL/uL — ABNORMAL LOW (ref 4.22–5.81)
RDW: 14.9 % (ref 11.5–15.5)
WBC: 25.9 10*3/uL — ABNORMAL HIGH (ref 4.0–10.5)
nRBC: 0 % (ref 0.0–0.2)

## 2019-11-22 LAB — DIFFERENTIAL
Abs Immature Granulocytes: 0 10*3/uL (ref 0.00–0.07)
Basophils Absolute: 0 10*3/uL (ref 0.0–0.1)
Basophils Relative: 0 %
Eosinophils Absolute: 0 10*3/uL (ref 0.0–0.5)
Eosinophils Relative: 0 %
Lymphocytes Relative: 2 %
Lymphs Abs: 0.5 10*3/uL — ABNORMAL LOW (ref 0.7–4.0)
Monocytes Absolute: 0 10*3/uL — ABNORMAL LOW (ref 0.1–1.0)
Monocytes Relative: 0 %
Neutro Abs: 25.4 10*3/uL — ABNORMAL HIGH (ref 1.7–7.7)
Neutrophils Relative %: 98 %
nRBC: 0 /100 WBC

## 2019-11-22 LAB — LACTIC ACID, PLASMA
Lactic Acid, Venous: 2.8 mmol/L (ref 0.5–1.9)
Lactic Acid, Venous: 2.5 mmol/L (ref 0.5–1.9)

## 2019-11-22 LAB — URINALYSIS, MICROSCOPIC (REFLEX)

## 2019-11-22 LAB — TROPONIN I (HIGH SENSITIVITY)
Troponin I (High Sensitivity): 37 ng/L — ABNORMAL HIGH (ref <18)
Troponin I (High Sensitivity): 41 ng/L — ABNORMAL HIGH (ref <18)

## 2019-11-22 LAB — LIPASE, BLOOD: Lipase: 50 U/L (ref 11–51)

## 2019-11-22 LAB — SARS CORONAVIRUS 2 BY RT PCR (HOSPITAL ORDER, PERFORMED IN ~~LOC~~ HOSPITAL LAB): SARS Coronavirus 2: NEGATIVE

## 2019-11-22 LAB — HEMOGLOBIN A1C
Hgb A1c MFr Bld: 7.1 % — ABNORMAL HIGH (ref 4.8–5.6)
Mean Plasma Glucose: 157.07 mg/dL

## 2019-11-22 LAB — PROTIME-INR
INR: 1.1 (ref 0.8–1.2)
Prothrombin Time: 14.1 seconds (ref 11.4–15.2)

## 2019-11-22 LAB — CBG MONITORING, ED: Glucose-Capillary: 184 mg/dL — ABNORMAL HIGH (ref 70–99)

## 2019-11-22 LAB — PROCALCITONIN: Procalcitonin: 34.99 ng/mL

## 2019-11-22 LAB — BRAIN NATRIURETIC PEPTIDE: B Natriuretic Peptide: 210.9 pg/mL — ABNORMAL HIGH (ref 0.0–100.0)

## 2019-11-22 LAB — APTT: aPTT: 34 seconds (ref 24–36)

## 2019-11-22 MED ORDER — IPRATROPIUM-ALBUTEROL 0.5-2.5 (3) MG/3ML IN SOLN
3.0000 mL | Freq: Four times a day (QID) | RESPIRATORY_TRACT | Status: DC
  Administered 2019-11-23: 3 mL via RESPIRATORY_TRACT
  Filled 2019-11-22 (×2): qty 3

## 2019-11-22 MED ORDER — DOCUSATE SODIUM 100 MG PO CAPS
100.0000 mg | ORAL_CAPSULE | Freq: Two times a day (BID) | ORAL | Status: DC | PRN
  Administered 2019-11-29 – 2019-12-05 (×2): 100 mg via ORAL
  Filled 2019-11-22 (×3): qty 1

## 2019-11-22 MED ORDER — METRONIDAZOLE IN NACL 5-0.79 MG/ML-% IV SOLN
500.0000 mg | Freq: Once | INTRAVENOUS | Status: AC
  Administered 2019-11-22: 500 mg via INTRAVENOUS
  Filled 2019-11-22: qty 100

## 2019-11-22 MED ORDER — VANCOMYCIN HCL IN DEXTROSE 1-5 GM/200ML-% IV SOLN
1000.0000 mg | Freq: Once | INTRAVENOUS | Status: AC
  Administered 2019-11-22: 1000 mg via INTRAVENOUS
  Filled 2019-11-22: qty 200

## 2019-11-22 MED ORDER — INSULIN ASPART 100 UNIT/ML ~~LOC~~ SOLN
0.0000 [IU] | SUBCUTANEOUS | Status: DC
  Administered 2019-11-22 – 2019-11-24 (×10): 2 [IU] via SUBCUTANEOUS

## 2019-11-22 MED ORDER — HEPARIN SODIUM (PORCINE) 5000 UNIT/ML IJ SOLN
5000.0000 [IU] | Freq: Three times a day (TID) | INTRAMUSCULAR | Status: DC
  Administered 2019-11-22 – 2019-12-13 (×61): 5000 [IU] via SUBCUTANEOUS
  Filled 2019-11-22 (×58): qty 1

## 2019-11-22 MED ORDER — SODIUM CHLORIDE 0.9 % IV BOLUS
1000.0000 mL | Freq: Once | INTRAVENOUS | Status: AC
  Administered 2019-11-22: 1000 mL via INTRAVENOUS

## 2019-11-22 MED ORDER — VANCOMYCIN HCL 1500 MG/300ML IV SOLN
1500.0000 mg | Freq: Once | INTRAVENOUS | Status: AC
  Administered 2019-11-22: 1500 mg via INTRAVENOUS
  Filled 2019-11-22: qty 300

## 2019-11-22 MED ORDER — NOREPINEPHRINE 4 MG/250ML-% IV SOLN
0.0000 ug/min | INTRAVENOUS | Status: DC
  Administered 2019-11-22: 2 ug/min via INTRAVENOUS
  Administered 2019-11-23: 8 ug/min via INTRAVENOUS
  Filled 2019-11-22 (×2): qty 250

## 2019-11-22 MED ORDER — SODIUM CHLORIDE 0.9 % IV SOLN
250.0000 mL | INTRAVENOUS | Status: DC
  Administered 2019-11-23: 250 mL via INTRAVENOUS
  Administered 2019-12-05: 20 mL via INTRAVENOUS

## 2019-11-22 MED ORDER — ASPIRIN 81 MG PO CHEW
81.0000 mg | CHEWABLE_TABLET | Freq: Every day | ORAL | Status: DC
  Administered 2019-11-22 – 2019-11-29 (×6): 81 mg via ORAL
  Filled 2019-11-22 (×7): qty 1

## 2019-11-22 MED ORDER — SODIUM CHLORIDE 0.9 % IV SOLN
2.0000 g | Freq: Three times a day (TID) | INTRAVENOUS | Status: DC
  Administered 2019-11-23: 2 g via INTRAVENOUS
  Filled 2019-11-22: qty 2

## 2019-11-22 MED ORDER — ARFORMOTEROL TARTRATE 15 MCG/2ML IN NEBU
15.0000 ug | INHALATION_SOLUTION | Freq: Two times a day (BID) | RESPIRATORY_TRACT | Status: DC
  Administered 2019-11-22 – 2019-12-13 (×40): 15 ug via RESPIRATORY_TRACT
  Filled 2019-11-22 (×44): qty 2

## 2019-11-22 MED ORDER — VANCOMYCIN VARIABLE DOSE PER UNSTABLE RENAL FUNCTION (PHARMACIST DOSING): Status: DC

## 2019-11-22 MED ORDER — SODIUM CHLORIDE 0.9 % IV BOLUS
500.0000 mL | Freq: Once | INTRAVENOUS | Status: AC
  Administered 2019-11-22: 500 mL via INTRAVENOUS

## 2019-11-22 MED ORDER — BUDESONIDE 0.5 MG/2ML IN SUSP
0.5000 mg | Freq: Two times a day (BID) | RESPIRATORY_TRACT | Status: DC
  Administered 2019-11-22 – 2019-12-13 (×40): 0.5 mg via RESPIRATORY_TRACT
  Filled 2019-11-22 (×45): qty 2

## 2019-11-22 MED ORDER — POLYETHYLENE GLYCOL 3350 17 G PO PACK
17.0000 g | PACK | Freq: Every day | ORAL | Status: DC | PRN
  Administered 2019-11-29 – 2019-11-30 (×2): 17 g via ORAL
  Filled 2019-11-22 (×2): qty 1

## 2019-11-22 MED ORDER — SODIUM CHLORIDE 0.9 % IV SOLN
2.0000 g | Freq: Once | INTRAVENOUS | Status: AC
  Administered 2019-11-22: 2 g via INTRAVENOUS
  Filled 2019-11-22: qty 2

## 2019-11-22 MED ORDER — LACTATED RINGERS IV SOLN: INTRAVENOUS | Status: AC

## 2019-11-22 NOTE — Progress Notes (Signed)
Pharmacy Antibiotic Note  Kevin [name]. is a 69 y.o. male admitted on 11/22/2019 with sepsis.  Pharmacy has been consulted for Aztreonam and Vancomycin   dosing.   Height: 5\' 10"  (177.8 cm) Weight: (!) 176.9 kg (390 lb) IBW/kg (Calculated) : 73  Temp (24hrs), Avg:97.8 F (36.6 C), Min:97.3 F (36.3 C), Max:98.3 F (36.8 C)  Recent Labs  Lab 11/22/19 1507 11/22/19 1544 11/22/19 1545  WBC 25.9*  --   --   CREATININE  --   --  3.27*  LATICACIDVEN  --  2.8*  --     Estimated Creatinine Clearance: 33.6 mL/min (A) (by C-G formula based on SCr of 3.27 mg/dL (H)).    Allergies  Allergen Reactions  . Benazepril Swelling  . Cephalexin Itching  . Mirabegron Other (See Comments)    Congestion, wheezing, coughing  . Penicillins   . Percodan [Oxycodone-Aspirin]   . Molds & Smuts Other (See Comments)    RUNNNING NOSE AND EYES RUNNNING NOSE AND EYES     Antimicrobials this admission: 8/24 Aztreonam  >>  8/24 Vancomycin >>   Dose adjustments this admission: N/a  Microbiology results: Pending   Plan:  - Aztreonam 2g IV q8h - Vancomycin 2500mg  IV x 1 dose  - Due to AKI will dose vancomycin based on random levels at this time may consider a 24hr level inf scr improves  - Monitor patients renal function and urine output  - De-escalate ABX when appropriate   Thank you for allowing pharmacy to be a part of this patient's care.  9/24 PharmD. BCPS 11/22/2019 5:11 PM

## 2019-11-22 NOTE — ED Triage Notes (Signed)
Patient arrived via [facility] from home, wife stated that patient has had increased lethargy and weakness for 4 days and had not been able to eat anything. Per EMS upon arrived patient O2 sats were 86% on RA and came up on 4L to 94%. Patient BP was 70/40 and was given of NS BP 89/51. Patient has a pacemaker. Denies any nausea or vomiting. CBG-264. Denies any N/V/D

## 2019-11-22 NOTE — Sepsis Progress Note (Signed)
Notified provider of need to order additional  fluid bolus. 

## 2019-11-22 NOTE — H&P (Signed)
NAME:  Kevin [name]., MRN:  [id number], DOB:  April 24, 1950, LOS: 0 ADMISSION DATE:  11/22/2019, CONSULTATION DATE: 11/22/2019 REFERRING MD:  Redge Gainer, ED  CHIEF COMPLAINT: Lethargy and malaise  Brief History   Patient is a 69 year old male brought to the emergency room for evaluation of a 1 to 1-1/2-week history of worsening lethargy malaise decreased p.o. intake.  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 intake, vague abdominal and chest complaints of pain, diarrhea, and decreased appetite.  In the emergency room he was noted to be hypotensive.  At this point he is receiving a liter of IV fluid and is on 9 mics of Levophed.  Systolic is 100 diastolic is 80 at this point.  He is awake alert although somewhat lethargic.  He really cannot give me any specific complaints other than that mentioned above in regards to discomfort or pain. Laboratories are pertinent for glucose of 174 with a known history of type 2 diabetes mellitus, lactic acid is 2.8, troponin is 41.  Urinalysis is fairly unremarkable.  Creatinine is 3.27 with a BUN of 118.  White count is 25 hemoglobin is 11 platelet count 165.  Past Medical History   . Diabetes mellitus without complication (HCC)   . Hypertension      Significant Hospital Events   Admission to [facility], ICU 11/22/2019  Consults:  PCCM  Procedures:  NA  Significant Diagnostic Tests:  As above  Micro Data:  Blood cultures planted, urinalysis unremarkable, UA pending.  Antimicrobials:  Empiric aztreonam and vancomycin  Interim history/subjective:  NA  Objective   Blood pressure 103/64, pulse 65, temperature (!) 97.3 F (36.3 C), temperature source Rectal, resp. rate 15, height 5\' 10"  (1.778 m), weight (!) 176.9 kg, SpO2 93 %.        Intake/Output Summary  (Last 24 hours) at 11/22/2019 1950 Last data filed at 11/22/2019 1831 Gross per 24 hour  Intake 1800 ml  Output --  Net 1800 ml   Filed Weights   11/22/19 1456  Weight: (!) 176.9 kg    Examination: General: Obese white male in no acute distress HENT: Within normal limits Lungs: Clear Cardiovascular: Regular rate and rhythm Abdomen: Mild abdominal tenderness to right-sided abdominal palpation without rebound Extremities: Trace peripheral edema Neuro: Awake alert nonfocal GU: N/A  Resolved Hospital Problem list   NA  Assessment & Plan:  1.  General lethargy of unclear etiology: Patient is obviously dehydrated with decreased p.o. intake intermittent diarrhea.  Will get ultrasound of the gallbladder.  Empiric antibiotics extremely with vancomycin started which will cover not only urinary tract infection but cholecystitis.  2.  Acute renal failure with prerenal azotemia: Patient has had decreased p.o. intake and diarrhea possibly explaining this.  We will get ultrasound of the kidneys to rule out obstruction along with Foley catheter to ensure there is no bladder obstruction.  3.  Hyperglycemia with history of type 2 diabetes mellitus: We will cover with sliding scale insulin   4.  Leukocytosis:  Most likely secondary to as of yet occult infectious process.  Empiric antibiotic coverage. Await blood and urine cultures along and with 11/24/19 of GB and kidneys.    Best practice:  Diet: Clear liquids Pain/Anxiety/Delirium protocol (if indicated): N/A VAP protocol (if indicated): N/A DVT prophylaxis: SCDs/Lovenox GI prophylaxis:NA Glucose control: Sliding  scale insulin  Mobility:bedrest  Code Status: full Family Communication: discussed with patient and wife at bedside Disposition: to icu, on levophed  Labs   CBC: Recent Labs  Lab 11/22/19 1507  WBC 25.9*  NEUTROABS 25.4*  HGB 11.3*  HCT 34.0*  MCV 88.1  PLT 165    Basic Metabolic Panel: Recent Labs  Lab 11/22/19 1545   NA 134*  K 3.3*  CL 97*  CO2 24  GLUCOSE 174*  BUN 118*  CREATININE 3.27*  CALCIUM 8.7*   GFR: Estimated Creatinine Clearance: 33.6 mL/min (A) (by C-G formula based on SCr of 3.27 mg/dL (H)). Recent Labs  Lab 11/22/19 1507 11/22/19 1544 11/22/19 1700  WBC 25.9*  --   --   LATICACIDVEN  --  2.8* 2.5*    Liver Function Tests: Recent Labs  Lab 11/22/19 1545  AST 42*  ALT 95*  ALKPHOS 299*  BILITOT 1.5*  PROT 5.7*  ALBUMIN 1.9*   Recent Labs  Lab 11/22/19 1545  LIPASE 50   No results for input(s): AMMONIA in the last 168 hours.  ABG No results found for: PHART, PCO2ART, PO2ART, HCO3, TCO2, ACIDBASEDEF, O2SAT   Coagulation Profile: Recent Labs  Lab 11/22/19 1545  INR 1.1    Cardiac Enzymes: No results for input(s): CKTOTAL, CKMB, CKMBINDEX, TROPONINI in the last 168 hours.  HbA1C: No results found for: HGBA1C  CBG: No results for input(s): GLUCAP in the last 168 hours.  Review of Systems:   Negative on 14 point review of systems except as mentioned in HPI  Past Medical History  He,  has a past medical history of Diabetes mellitus without complication (HCC) and Hypertension.   Surgical History    Past Surgical History:  Procedure Laterality Date  . BACK SURGERY    . STOMACH SURGERY       Social History   reports that he has never smoked. He has never used smokeless tobacco. He reports that he does not drink alcohol.   Family History   His family history is not on file.   Allergies Allergies  Allergen Reactions  . Benazepril Swelling  . Cephalexin Itching  . Mirabegron Other (See Comments)    Congestion, wheezing, coughing  . Penicillins   . Percodan [Oxycodone-Aspirin]   . Molds & Smuts Other (See Comments)    RUNNNING NOSE AND EYES RUNNNING NOSE AND EYES      Home Medications  Prior to Admission medications   Medication Sig Start Date End Date Taking? Authorizing Provider  amLODipine (NORVASC) 5 MG tablet Take 1 tablet (5 mg  total) by mouth daily. 05/13/19   Linwood Dibbles, MD  aspirin 81 MG EC tablet Take by mouth. 04/12/15   [provider]  aspirin EC 81 MG tablet Take 81 mg by mouth daily.    [provider]  atorvastatin (LIPITOR) 20 MG tablet TAKE 1 TABLET DAILY 12/22/15   [provider]  atorvastatin (LIPITOR) 40 MG tablet Take 40 mg by mouth daily.    [provider]  augmented betamethasone dipropionate (DIPROLENE-AF) 0.05 % cream APP EXT AA BID 01/19/19   [provider]  benazepril (LOTENSIN) 20 MG tablet Take by mouth. 03/17/19   [provider]  fluticasone (FLONASE) 50 MCG/ACT nasal spray 2 sprays by Each Nare route daily. 07/23/18   [provider]  Fluticasone-Salmeterol (ADVAIR) 250-50 MCG/DOSE AEPB Inhale into the lungs. 02/14/19 11/22/19  [provider]  hydrochlorothiazide (HYDRODIURIL) 25 MG tablet TAKE 1 TABLET DAILY  02/11/19   [provider]  ibuprofen (ADVIL,MOTRIN) 200 MG tablet Take 600-800 mg by mouth every 8 (eight) hours as needed. For pain.    [provider]  ipratropium-albuterol (DUONEB) 0.5-2.5 (3) MG/3ML SOLN Inhale into the lungs. 08/30/18   [provider]  metFORMIN (GLUCOPHAGE) 500 MG tablet Take by mouth 2 (two) times daily with a meal.    [provider]  metFORMIN (GLUCOPHAGE-XR) 500 MG 24 hr tablet TAKE 1 TABLET TWICE A DAY 02/25/16   [provider]  omeprazole (PRILOSEC) 20 MG capsule Take 20 mg by mouth daily.    [provider]  predniSONE (DELTASONE) 50 MG tablet Take 1 tablet (50 mg total) by mouth daily. 05/13/19   Linwood Dibbles, MD  Semaglutide,0.25 or 0.5MG /DOS, (OZEMPIC, 0.25 OR 0.5 MG/DOSE,) 2 MG/1.5ML SOPN Inject into the skin. 03/17/19   [provider]  tadalafil (CIALIS) 10 MG tablet Take 10 mg by mouth daily as needed for erectile dysfunction.    [provider]  urea (CARMOL) 40 % CREA Apply 1 application topically See admin  instructions.  01/22/19   [provider]  amlodipine-benazepril (LOTREL) 2.5-10 MG capsule Take 1 capsule by mouth daily.  05/13/19  [provider]     Critical care time: 35 minutes spent bedside review and chart review and critical care planning.

## 2019-11-22 NOTE — ED Provider Notes (Signed)
Kevin [name]   CSN: [id number] Arrival date & time: 11/22/19  1442    History Chief Complaint  Patient presents with  . Weakness  . Hypotension    Kevin [name]. is a 69 y.o. 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 Patient states he has had nasal congestion, dry cough, dyspnea intermittently, chest pain, subjective fevers, chills, & abdominal pain with his generalized weakness. Nonspecific on chest/abdominal pain, does not further evaluate much. He has had progressively worsening weakness/fatigue with poor PO intake, his wife was concerned he was very weak and less active which prompted her to call EMS. Per EMS BP SpO2 86% on RA- applied 4L via [place] with improvement, BP 70/40- given 1L NS with improvement to 89/51. Patient denies vomiting, diarrhea, constipation, or syncope. Has received both doses of his COVID 19 vaccine.   Upon patient's wife arrival she confirms subjective fevers/chills at home with weakness & poor PO intake. Relays started to complain of dyspnea today. He has not complained of cough, abdominal pain or chest pain to her.   HPI     Past Medical History:  Diagnosis Date  . Diabetes mellitus without complication ([place])   . Hypertension     There are no problems to display for this patient.   Past Surgical History:  Procedure Laterality Date  . BACK SURGERY    . STOMACH SURGERY         No family history on file.  Social History   Tobacco Use  . Smoking status: Never Smoker  . Smokeless tobacco: Never Used  Substance Use Topics  . Alcohol use: No  . Drug use: Not on file    Home Medications Prior to Admission medications   Medication Sig Start Date End Date Taking? Authorizing Provider    amLODipine (NORVASC) 5 MG tablet Take 1 tablet (5 mg total) by mouth daily. 05/13/19   Dorie Rank, MD  aspirin 81 MG EC tablet Take by mouth. 04/12/15   [provider]  aspirin EC 81 MG tablet Take 81 mg by mouth daily.    [provider]  atorvastatin (LIPITOR) 20 MG tablet TAKE 1 TABLET DAILY 12/22/15   [provider]  atorvastatin (LIPITOR) 40 MG tablet Take 40 mg by mouth daily.    [provider]  augmented betamethasone dipropionate (DIPROLENE-AF) 0.05 % cream APP EXT AA BID 01/19/19   [provider]  benazepril (LOTENSIN) 20 MG tablet Take by mouth. 03/17/19   [provider]  fluticasone (FLONASE) 50 MCG/ACT nasal spray 2 sprays by Each Nare route daily. 07/23/18   [provider]  Fluticasone-Salmeterol (ADVAIR) 250-50 MCG/DOSE AEPB Inhale into the lungs. 02/14/19 05/15/19  [provider]  hydrochlorothiazide (HYDRODIURIL) 25 MG tablet TAKE 1 TABLET DAILY 02/11/19   [provider]  ibuprofen (ADVIL,MOTRIN) 200 MG tablet Take 600-800 mg by mouth every 8 (eight) hours as needed. For pain.    [provider]  ipratropium-albuterol (DUONEB) 0.5-2.5 (3) MG/3ML SOLN Inhale into the lungs. 08/30/18   [provider]  metFORMIN (GLUCOPHAGE) 500 MG tablet Take by mouth 2 (two) times daily with a meal.    [provider]  metFORMIN (GLUCOPHAGE-XR) 500 MG 24 hr tablet TAKE 1 TABLET TWICE A DAY 02/25/16   [provider]  omeprazole (PRILOSEC) 20 MG capsule Take 20 mg by mouth daily.    [provider]  predniSONE (DELTASONE) 50 MG tablet Take 1 tablet (50 mg total) by mouth daily. 05/13/19   Dorie Rank, MD  Semaglutide,0.25 or 0.5MG/DOS, (OZEMPIC, 0.25 OR 0.5 MG/DOSE,) 2 MG/1.5ML SOPN Inject into the skin. 03/17/19   [provider]  tadalafil (CIALIS) 10 MG tablet Take 10 mg by mouth daily as needed for erectile dysfunction.    [provider]  urea (CARMOL)  40 % CREA APPLY TO AFFECTED AREA(S) TOPICALLY ON SKIN DAILY 01/22/19   [provider]  amlodipine-benazepril (LOTREL) 2.5-10 MG capsule Take 1 capsule by mouth daily.  05/13/19  [provider]    Allergies    Benazepril, Cephalexin, Mirabegron, Penicillins, Percodan [oxycodone-aspirin], and Molds & smuts  Review of Systems   Review of Systems  Constitutional: Positive for activity change, appetite change, chills, fatigue and fever.  HENT: Positive for congestion.   Respiratory: Positive for cough and shortness of breath.   Cardiovascular: Positive for chest pain.  Gastrointestinal: Positive for abdominal pain. Negative for blood in stool, constipation, diarrhea, nausea and vomiting.  Genitourinary: Negative for dysuria.  Neurological: Positive for weakness. Negative for syncope.  All other systems reviewed and are negative.   Physical Exam Updated Vital Signs Temp (!) 97.3 F (36.3 C) (Rectal)   Ht '5\' 10"'  (1.778 m)   Wt (!) 176.9 kg   SpO2 93%   BMI 55.96 kg/m   Physical Exam Vitals and nursing [name] reviewed.  Constitutional:      Appearance: He is well-developed. He is ill-appearing.  HENT:     Head: Normocephalic and atraumatic.  Eyes:     General:        Right eye: No discharge.        Left eye: No discharge.     Conjunctiva/sclera: Conjunctivae normal.     Pupils: Pupils are equal, round, and reactive to light.  Cardiovascular:     Rate and Rhythm: Normal rate and regular rhythm.  Pulmonary:     Effort: No respiratory distress.     Breath sounds: No wheezing or rales.     Comments: SpO2 mid 80s on RA, requiring 6L via [place] to maintain SpO2 > 92%. Poor air movement.  Abdominal:     Palpations: Abdomen is soft.     Tenderness: There is abdominal tenderness (generalized). There is no guarding or rebound.  Musculoskeletal:     Cervical back: Neck supple. No rigidity.     Right lower leg: Edema present.     Left lower leg: Edema present.      Comments: 1-2+ symmetric edema to the lower legs.   Skin:    General: Skin is warm and dry.     Findings: No rash.  Neurological:     Mental Status: He is alert.     Comments: Clear speech.   Psychiatric:        Behavior: Behavior normal.     ED Results / Procedures / Treatments   Labs (all labs ordered are listed, but only abnormal results are displayed) Labs Reviewed  LACTIC ACID, PLASMA - Abnormal; Notable for the following components:      Result Value   Lactic Acid, Venous 2.8 (*)    All other components within normal limits  COMPREHENSIVE METABOLIC PANEL - Abnormal; Notable for the following components:   Sodium 134 (*)    Potassium 3.3 (*)    Chloride  97 (*)    Glucose, Bld 174 (*)    BUN 118 (*)    Creatinine, Ser 3.27 (*)    Calcium 8.7 (*)    Total Protein 5.7 (*)    Albumin 1.9 (*)    AST 42 (*)    ALT 95 (*)    Alkaline Phosphatase 299 (*)    Total Bilirubin 1.5 (*)    GFR calc non Af Amer 18 (*)    GFR calc Af Amer 21 (*)    All other components within normal limits  CBC - Abnormal; Notable for the following components:   WBC 25.9 (*)    RBC 3.86 (*)    Hemoglobin 11.3 (*)    HCT 34.0 (*)    All other components within normal limits  DIFFERENTIAL - Abnormal; Notable for the following components:   Neutro Abs 25.4 (*)    Lymphs Abs 0.5 (*)    Monocytes Absolute 0.0 (*)    All other components within normal limits  TROPONIN I (HIGH SENSITIVITY) - Abnormal; Notable for the following components:   Troponin I (High Sensitivity) 37 (*)    All other components within normal limits  CULTURE, BLOOD (SINGLE)  URINE CULTURE  SARS CORONAVIRUS 2 BY RT PCR (HOSPITAL ORDER, [place] [facility])  CULTURE, BLOOD (ROUTINE X 2)  CULTURE, BLOOD (ROUTINE X 2)  PROTIME-INR  APTT  LIPASE, BLOOD  LACTIC ACID, PLASMA  CBC WITH DIFFERENTIAL/PLATELET  URINALYSIS, ROUTINE W REFLEX MICROSCOPIC  BRAIN NATRIURETIC PEPTIDE  TROPONIN I (HIGH SENSITIVITY)      EKG EKG Interpretation  Date/Time:  Tuesday November 22 2019 15:02:56 EDT Ventricular Rate:  69 PR Interval:    QRS Duration: 131 QT Interval:  434 QTC Calculation: 465 R Axis:   78 Text Interpretation: Sinus rhythm Ventricular premature complex Borderline prolonged PR interval Nonspecific intraventricular conduction delay Borderline T abnormalities, diffuse leads Since last tracing Nonspecific T wave abnormality NOW PRESENT Confirmed by Calvert Cantor [phone]) on 11/22/2019 3:38:38 PM   Radiology No results found.  Procedures .Critical Care Performed by: Amaryllis Dyke, PA-C Authorized by: Amaryllis Dyke, PA-C     CRITICAL CARE Performed by: Kennith Maes   Total critical care time: 55 minutes  Critical care time was exclusive of separately billable procedures and treating other patients.  Critical care was necessary to treat or prevent imminent or life-threatening deterioration.  Critical care was time spent personally by me on the following activities: development of treatment plan with patient and/or surrogate as well as nursing, discussions with consultants, evaluation of patient's response to treatment, examination of patient, obtaining history from patient or surrogate, ordering and performing treatments and interventions, ordering and review of laboratory studies, ordering and review of radiographic studies, pulse oximetry and re-evaluation of patient's condition.    (including critical care time)  Medications Ordered in ED Medications  aztreonam (AZACTAM) 2 g in sodium chloride 0.9 % 100 mL IVPB (has no administration in time range)  vancomycin (VANCOREADY) IVPB 1500 mg/300 mL (1,500 mg Intravenous New Bag/Given 11/22/19 1820)  vancomycin variable dose per unstable renal function (pharmacist dosing) (has no administration in time range)  aztreonam (AZACTAM) 2 g in sodium chloride 0.9 % 100 mL IVPB (has no administration in time range)   norepinephrine (LEVOPHED) 38m in 2536mpremix infusion (8 mcg/min Intravenous Rate/Dose Change 11/22/19 1835)  sodium chloride 0.9 % bolus 1,000 mL (0 mLs Intravenous Stopped 11/22/19 1831)  sodium chloride 0.9 % bolus 500 mL (  0 mLs Intravenous Stopped 11/22/19 1813)  metroNIDAZOLE (FLAGYL) IVPB 500 mg (0 mg Intravenous Stopped 11/22/19 1813)  vancomycin (VANCOCIN) IVPB 1000 mg/200 mL premix (0 mg Intravenous Stopped 11/22/19 1813)    ED Course  I have reviewed the triage vital signs and the nursing notes.  Pertinent labs & imaging results that were available during my care of the patient were reviewed by me and considered in my medical decision making (see chart for details).  Kevin L Gerda Diss Jr. was evaluated in Emergency Department on 11/22/2019 for the symptoms described in the history of present illness. He/she was evaluated in the context of the global COVID-19 pandemic, which necessitated consideration that the patient might be at risk for infection with the SARS-CoV-2 virus that causes COVID-19. Institutional protocols and algorithms that pertain to the evaluation of patients at risk for COVID-19 are in a state of rapid change based on information released by regulatory bodies including the CDC and federal and state organizations. These policies and algorithms were followed during the patient's care in the ED.    MDM Rules/Calculators/A&P                         Patient presents to the ED with complaints of generalized weakness.  Found to be hypotensive and hypoxic on arrival requiring 6L of O2 via [place] to maintain SpO2 > 90%. Afebrile with rectal temp. Has received 1L of NS per EMS and remains w/ BP 80s/50s initially however improved to 90s/50s with repeat. Additional 1L NS ordered. Labs & imaging ordered as well.   Additional history obtained:  Additional history obtained from EMS. Previous records obtained and reviewed.  EKG: Non specific T waves present.  Lab Tests:  I Ordered, reviewed,  and interpreted labs, which included:  CBC: Leukocytosis @ 25.9 with left shift. Anemia that is decreased from prior. Plt WNL CMP: AKI with creatinine 3.27 and BUN 118- most recently 1.11 & 23 respectively- continue fluids. Multiple electrolyte abnormalities as above. Transaminitis w/ elevated T billi & alk phos.  Lipase: WNL Troponin: Mildly elevated, possibly demand PT/INR/APTT: WNL  Imaging Studies ordered:  I ordered imaging studies which included CXR, I independently visualized and interpreted imaging which showed bibasilar atelectasis.   16:27: BP 81/56 again, leukocytosis @ 25,000 resulted, more concern for sepsis now, additional 500 cc of fluid ordered to meet ideal body weight 30 cc/kg bolus (2400 mL) criteria as patient had 1 L NS with EMS and has 1L NS running in the ED currently (200 cc of this has been administered thus far) for a total of 2500 ccs of fluids not including fluids with abx. Abx also started for unknown source with code sepsis at this time.   Initially ordered CTA of the chest & CT A/P with contrast, however given his significant AKI he will not be able to receive IV contrast dye. Repeat abdominal exam is nontender therefore feel that CT A/P is not emergently necessary at this time. Will likely need evaluation for PE at some point during admission if hypoxia continues.   Sepsis - Repeat Assessment Performed at:18:12     Vitals Blood pressure 86/54, pulse 70, temperature (!) 97.3 F (36.3 C), temperature source Rectal, resp. rate 24, height '5\' 10"'  (1.778 m), weight (!) 176.9 kg, SpO2 93% on 6L via .  Heart:     Regular rate and rhythm Lungs:    remains with poor air movement.  Capillary Refill:   <2 sec Peripheral  Pulse:   Dorsalis pedis pulse  palpable Skin:     Dry  Urinalysis without UTI.  Lactic acid improving with fluids.  BP remains in the 15Z systolic following 30 cc/kg bolus, Dr. Karle Starch has started leveophed, will discuss with ICU.   19:00: BP  improving on levophed.  19:13: CONSULT: Discussed with intensivist Dr. Emmit Alexanders- ICU team to see patient for admission.   This is a shared visit with supervising physician Dr. Karle Starch who has independently evaluated patient & provided guidance in evaluation/management/disposition, in agreement with care   Portions of this [name] were generated with Dragon dictation software. Dictation errors may occur despite best attempts at proofreading.   Final Clinical Impression(s) / ED Diagnoses Final diagnoses:  Acute renal failure, unspecified acute renal failure type [facility])  Septic shock [facility])    Rx / DC Orders ED Discharge Orders    None       Leafy Kindle 11/22/19 1937    Truddie Hidden, MD 11/22/19 2158

## 2019-11-22 NOTE — Sepsis Progress Note (Signed)
Notified bedside nurse of need to draw repeat lactic acid. 

## 2019-11-22 NOTE — ED Notes (Signed)
Provider made aware of patients Bp (69/47).

## 2019-11-22 NOTE — ED Provider Notes (Signed)
Patient seen and examined, agree with assessment and plan by APP. Patient with general weakness, poor PO intake and SOB. Patient noted to be hypoxic and hypotensive. Patient denies any specific symptoms, just doesn't feel well. WBC noted to be elevated, although not febrile, sepsis protocol and IVF bolus initiated. Will recheck BP after IVF. Cr is also elevated above baseline, likely reflecting pre-renal AKI. IVF should help with that as well. Broad spectrum Abx ordered for unknown source.   6:21 PM BP remains low after IVF bolus. Will initiate Levophed for continued signs of shock, hypovolemic vs septic. Doubt cardiogenic.   .Critical Care Performed by: Pollyann Savoy, MD Authorized by: Pollyann Savoy, MD   Critical care provider statement:    Critical care time (minutes):  45   Critical care was necessary to treat or prevent imminent or life-threatening deterioration of the following conditions:  Shock and renal failure   Critical care was time spent personally by me on the following activities:  Discussions with consultants, evaluation of patient's response to treatment, examination of patient, ordering and performing treatments and interventions, ordering and review of laboratory studies, ordering and review of radiographic studies, pulse oximetry, re-evaluation of patient's condition, obtaining history from patient or surrogate and review of old charts      Pollyann Savoy, MD 11/22/19 2159

## 2019-11-23 ENCOUNTER — Inpatient Hospital Stay (HOSPITAL_COMMUNITY): Payer: Medicare Other

## 2019-11-23 DIAGNOSIS — I959 Hypotension, unspecified: Secondary | ICD-10-CM

## 2019-11-23 DIAGNOSIS — R0602 Shortness of breath: Secondary | ICD-10-CM

## 2019-11-23 LAB — CBC
HCT: 34 % — ABNORMAL LOW (ref 39.0–52.0)
Hemoglobin: 11.3 g/dL — ABNORMAL LOW (ref 13.0–17.0)
MCH: 29.4 pg (ref 26.0–34.0)
MCHC: 33.2 g/dL (ref 30.0–36.0)
MCV: 88.5 fL (ref 80.0–100.0)
Platelets: 178 10*3/uL (ref 150–400)
RBC: 3.84 MIL/uL — ABNORMAL LOW (ref 4.22–5.81)
RDW: 15.2 % (ref 11.5–15.5)
WBC: 35.6 10*3/uL — ABNORMAL HIGH (ref 4.0–10.5)
nRBC: 0 % (ref 0.0–0.2)

## 2019-11-23 LAB — BLOOD CULTURE ID PANEL (REFLEXED) - BCID2

## 2019-11-23 LAB — ECHOCARDIOGRAM LIMITED
Area-P 1/2: 3.48 cm2
Height: 70 in
S' Lateral: 4.7 cm
Weight: 6240 oz

## 2019-11-23 LAB — CBG MONITORING, ED
Glucose-Capillary: 158 mg/dL — ABNORMAL HIGH (ref 70–99)
Glucose-Capillary: 171 mg/dL — ABNORMAL HIGH (ref 70–99)
Glucose-Capillary: 173 mg/dL — ABNORMAL HIGH (ref 70–99)
Glucose-Capillary: 178 mg/dL — ABNORMAL HIGH (ref 70–99)
Glucose-Capillary: 181 mg/dL — ABNORMAL HIGH (ref 70–99)
Glucose-Capillary: 187 mg/dL — ABNORMAL HIGH (ref 70–99)

## 2019-11-23 LAB — I-STAT ARTERIAL BLOOD GAS, ED
Acid-Base Excess: 4 mmol/L — ABNORMAL HIGH (ref 0.0–2.0)
Bicarbonate: 30.8 mmol/L — ABNORMAL HIGH (ref 20.0–28.0)
Calcium, Ion: 1.3 mmol/L (ref 1.15–1.40)
HCT: 36 % — ABNORMAL LOW (ref 39.0–52.0)
Hemoglobin: 12.2 g/dL — ABNORMAL LOW (ref 13.0–17.0)
O2 Saturation: 89 %
Patient temperature: 98
Potassium: 3.5 mmol/L (ref 3.5–5.1)
Sodium: 138 mmol/L (ref 135–145)
TCO2: 32 mmol/L (ref 22–32)
pCO2 arterial: 53.4 mmHg — ABNORMAL HIGH (ref 32.0–48.0)
pH, Arterial: 7.367 (ref 7.350–7.450)
pO2, Arterial: 58 mmHg — ABNORMAL LOW (ref 83.0–108.0)

## 2019-11-23 LAB — BASIC METABOLIC PANEL
Anion gap: 10 (ref 5–15)
BUN: 102 mg/dL — ABNORMAL HIGH (ref 8–23)
CO2: 26 mmol/L (ref 22–32)
Calcium: 8.8 mg/dL — ABNORMAL LOW (ref 8.9–10.3)
Chloride: 100 mmol/L (ref 98–111)
Creatinine, Ser: 2.49 mg/dL — ABNORMAL HIGH (ref 0.61–1.24)
GFR calc Af Amer: 29 mL/min — ABNORMAL LOW (ref >60)
GFR calc non Af Amer: 25 mL/min — ABNORMAL LOW (ref >60)
Glucose, Bld: 179 mg/dL — ABNORMAL HIGH (ref 70–99)
Potassium: 3.5 mmol/L (ref 3.5–5.1)
Sodium: 136 mmol/L (ref 135–145)

## 2019-11-23 LAB — URINE CULTURE

## 2019-11-23 LAB — RAPID URINE DRUG SCREEN, HOSP PERFORMED
Amphetamines: NOT DETECTED
Barbiturates: NOT DETECTED
Benzodiazepines: NOT DETECTED
Cocaine: NOT DETECTED
Opiates: NOT DETECTED
Tetrahydrocannabinol: NOT DETECTED

## 2019-11-23 LAB — GLUCOSE, CAPILLARY
Glucose-Capillary: 182 mg/dL — ABNORMAL HIGH (ref 70–99)
Glucose-Capillary: 193 mg/dL — ABNORMAL HIGH (ref 70–99)

## 2019-11-23 LAB — CORTISOL: Cortisol, Plasma: 31.3 ug/dL

## 2019-11-23 LAB — LACTIC ACID, PLASMA: Lactic Acid, Venous: 1.6 mmol/L (ref 0.5–1.9)

## 2019-11-23 LAB — MAGNESIUM: Magnesium: 2.1 mg/dL (ref 1.7–2.4)

## 2019-11-23 LAB — PHOSPHORUS: Phosphorus: 4 mg/dL (ref 2.5–4.6)

## 2019-11-23 MED ORDER — HALOPERIDOL LACTATE 5 MG/ML IJ SOLN
2.0000 mg | Freq: Four times a day (QID) | INTRAMUSCULAR | Status: DC | PRN
  Administered 2019-11-23 (×3): 2 mg via INTRAVENOUS
  Filled 2019-11-23 (×3): qty 1

## 2019-11-23 MED ORDER — SODIUM CHLORIDE 0.9 % IV SOLN
2.0000 g | Freq: Two times a day (BID) | INTRAVENOUS | Status: DC
  Administered 2019-11-23 – 2019-11-24 (×2): 2 g via INTRAVENOUS
  Filled 2019-11-23 (×3): qty 2

## 2019-11-23 MED ORDER — VANCOMYCIN HCL 1750 MG/350ML IV SOLN
1750.0000 mg | INTRAVENOUS | Status: DC
  Administered 2019-11-23: 1750 mg via INTRAVENOUS
  Filled 2019-11-23: qty 350

## 2019-11-23 MED ORDER — CHLORHEXIDINE GLUCONATE CLOTH 2 % EX PADS
6.0000 | MEDICATED_PAD | Freq: Every day | CUTANEOUS | Status: DC
  Administered 2019-11-24 – 2019-12-13 (×20): 6 via TOPICAL

## 2019-11-23 MED ORDER — PERFLUTREN LIPID MICROSPHERE
1.0000 mL | INTRAVENOUS | Status: AC | PRN
  Administered 2019-11-23: 2 mL via INTRAVENOUS
  Filled 2019-11-23: qty 10

## 2019-11-23 MED ORDER — ORAL CARE MOUTH RINSE
15.0000 mL | Freq: Two times a day (BID) | OROMUCOSAL | Status: DC
  Administered 2019-11-23 – 2019-12-13 (×31): 15 mL via OROMUCOSAL

## 2019-11-23 NOTE — ED Notes (Signed)
Paged PA-minor . Pt is trying to pull out foley and Iv's

## 2019-11-23 NOTE — ED Notes (Signed)
MD paged regarding pt mentation status

## 2019-11-23 NOTE — Progress Notes (Signed)
Pharmacy Antibiotic Note  Kevin [name] Kevin [name]. is a 69 y.o. male admitted on 11/22/2019 with concern for sepsis. Pharmacy has been consulted for Vancomycin + Cefepime dosing.  The patient is noted to have resolving AKI with SCr down to 2.49 << 3.27 (SCr was 0.87 in Jan 2019). Will order standing Vancomycin doses given improvement.  Pt has unknown PCN allergy listed and only "itching" listed with Keflex. Discussed with CCM and will transition Azactam to Cefepime.  Plan: - Vancomycin 1750 mg IV every 24 hours - Cefepime 2g IV every 12 hours - Will continue to follow renal function, culture results, LOT, and antibiotic de-escalation plans   Height: 5\' 10"  (177.8 cm) Weight: (!) 176.9 kg (390 lb) IBW/kg (Calculated) : 73  Temp (24hrs), Avg:97.8 F (36.6 C), Min:97.3 F (36.3 C), Max:98.3 F (36.8 C)  Recent Labs  Lab 11/22/19 1507 11/22/19 1544 11/22/19 1545 11/22/19 1700 11/23/19 0420  WBC 25.9*  --   --   --  35.6*  CREATININE  --   --  3.27*  --  2.49*  LATICACIDVEN  --  2.8*  --  2.5*  --     Estimated Creatinine Clearance: 44.1 mL/min (A) (by C-G formula based on SCr of 2.49 mg/dL (H)).    Allergies  Allergen Reactions  . Ibuprofen Other (See Comments)    Was told by MD to not take this  . Penicillins Other (See Comments)    Patient doesn't know reaction- was always told he was allergic   . Oxycodone Itching  . Amlodipine Swelling and Other (See Comments)    Leg edema   . Benazepril Swelling and Other (See Comments)    Lips only became swollen  . Cephalexin Itching  . Mirabegron Other (See Comments) and Cough    Congestion and wheezing, also  . Percodan [Oxycodone-Aspirin] Itching  . Molds & Smuts Other (See Comments)    "RUNNNING NOSE AND EYES"    Antimicrobials this admission: Vanc 8/24 >> Azactam 8/24 >> 8/25 Flagyl 8/24 x 1 Cefepime 8/25 >>  Microbiology results: 8/24 BCx >> ngtd 8/24 COVID >> neg 8/24 UCx >> neg  Thank you for allowing pharmacy to  be a part of this patient's care.  9/24, PharmD, BCPS Clinical Pharmacist Clinical phone for 11/23/2019: [phone] 11/23/2019 9:25 AM   **Pharmacist phone directory can now be found on [email or web address] ([email or web address]).  Listed under [facility].

## 2019-11-23 NOTE — ED Notes (Signed)
Pt sitting on the side of the bed attempting to get out. Pt pulled off all his cardiac leads and pulled out his IV.

## 2019-11-23 NOTE — Progress Notes (Signed)
NAME:  Kevin Falletta., MRN:  [id number], DOB:  1951-03-02, LOS: 1 ADMISSION DATE:  11/22/2019, CONSULTATION DATE: 11/22/2019 REFERRING MD:  Redge Gainer, ED  CHIEF COMPLAINT: Lethargy and malaise  Brief History   Patient is a 69 year old male brought to the emergency room for evaluation of a 1 to 1-1/2-week history of worsening lethargy malaise decreased p.o. intake.  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 intake, vague abdominal and chest complaints of pain, diarrhea, and decreased appetite.  In the emergency room he was noted to be hypotensive.  At this point he is receiving a liter of IV fluid and is on 9 mics of Levophed.  Systolic is 100 diastolic is 80 at this point.  He is awake alert although somewhat lethargic.  He really cannot give me any specific complaints other than that mentioned above in regards to discomfort or pain. Laboratories are pertinent for glucose of 174 with a known history of type 2 diabetes mellitus, lactic acid is 2.8, troponin is 41.  Urinalysis is fairly unremarkable.  Creatinine is 3.27 with a BUN of 118.  White count is 25 hemoglobin is 11 platelet count 165.  Past Medical History    Diabetes mellitus without complication (HCC)    Hypertension      Significant Hospital Events   Admission to [facility], ICU 11/22/2019  Consults:  PCCM  Procedures:  NA  Significant Diagnostic Tests:  As above  Micro Data:  Blood cultures planted, urinalysis unremarkable, UA pending.  Antimicrobials:  Empiric aztreonam and vancomycin  Interim history/subjective:  NA  Objective   Blood pressure (!) 148/96, pulse 69, temperature (!) 97.3 F (36.3 C), temperature source Rectal, resp. rate (!) 26, height 5\' 10"  (1.778 m), weight (!) 176.9 kg, SpO2 91 %.        Intake/Output  Summary (Last 24 hours) at 11/23/2019 0816 Last data filed at 11/22/2019 2116 Gross per 24 hour  Intake 2200 ml  Output --  Net 2200 ml   Filed Weights   11/22/19 1456  Weight: (!) 176.9 kg    Examination: General: Morbidly obese male who is anxious and agitated HEENT: No JVD or lymphadenopathy is appreciated Neuro: Awake follows commands disorientated to place agitated CV: Heart sounds are distant PULM: Abdominal chest wall paradoxus is noted respiratory rate of 36  GI: Obese distended nonpainful GU: Amber urine Extremities: warm/dry,  edema  Skin: no rashes or lesions   Resolved Hospital Problem list   NA  Assessment & Plan:  Acute on chronic hypoxic respiratory failure in the setting of obstructive sleep apnea with noncompliance with CPAP, further complicated by anxiety and suspected volume overload. Unable to tolerate noninvasive mechanical dilatory support due to claustrophobia and anxiety Continue O2 to keep sats greater than 88% Bronchodilators as needed Diuresis when able note has been on recent steroids for back pain Transferred to the intensive care unit as quickly as possible Check ABG Repeat chest x-ray  Hypotension/shock in the setting of elevated procalcitonin elevated WBC unknown source but 90s been on immunosuppressant with prednisone just completed Dosepak for back pain per orthopedics. KVO fluids Check cortisol level Low threshold to start Solu-Cortef stress steroids Empirical antimicrobial therapy with vancomycin and Azactam. Follow culture data  Acute renal failure Lab Results  Component Value Date   CREATININE 2.49 (H) 11/23/2019  CREATININE 3.27 (H) 11/22/2019   CREATININE 0.87 04/04/2017   Monitor creatinine Avoid nephrotoxins Hold diuresis for now Check lactic acid if remains elevated will increase IV fluids to KVO to 75 cc an hour Abd ultrasound shows hepatic steatosis   Type 2 diabetes mellitus CBG (last 3)  Recent Labs     11/23/19 0046 11/23/19 0444 11/23/19 0815  GLUCAP 181* 178* 158*    Sliding-scale insulin protocol   Best practice:  Diet: Clear liquids Pain/Anxiety/Delirium protocol (if indicated): Haldol prn VAP protocol (if indicated): N/A DVT prophylaxis: SCDs/Lovenox GI prophylaxis:NA Glucose control: Sliding scale insulin  Mobility:bedrest  Code Status: full Family Communication: 11/22/2019 wife updated via phone extensively.  She was told there is a possibility may end up intubated and on mechanical ventilatory support. Disposition: to icu, on levophed  Labs   CBC: Recent Labs  Lab 11/22/19 1507 11/23/19 0420 11/23/19 0814  WBC 25.9* 35.6*  --   NEUTROABS 25.4*  --   --   HGB 11.3* 11.3* 12.2*  HCT 34.0* 34.0* 36.0*  MCV 88.1 88.5  --   PLT 165 178  --     Basic Metabolic Panel: Recent Labs  Lab 11/22/19 1545 11/23/19 0420 11/23/19 0814  NA 134* 136 138  K 3.3* 3.5 3.5  CL 97* 100  --   CO2 24 26  --   GLUCOSE 174* 179*  --   BUN 118* 102*  --   CREATININE 3.27* 2.49*  --   CALCIUM 8.7* 8.8*  --   MG  --  2.1  --   PHOS  --  4.0  --    GFR: Estimated Creatinine Clearance: 44.1 mL/min (A) (by C-G formula based on SCr of 2.49 mg/dL (H)). Recent Labs  Lab 11/22/19 1507 11/22/19 1544 11/22/19 1700 11/22/19 1954 11/23/19 0420  PROCALCITON  --   --   --  34.99  --   WBC 25.9*  --   --   --  35.6*  LATICACIDVEN  --  2.8* 2.5*  --   --     Liver Function Tests: Recent Labs  Lab 11/22/19 1545  AST 42*  ALT 95*  ALKPHOS 299*  BILITOT 1.5*  PROT 5.7*  ALBUMIN 1.9*   Recent Labs  Lab 11/22/19 1545  LIPASE 50   No results for input(s): AMMONIA in the last 168 hours.  ABG    Component Value Date/Time   PHART 7.367 11/23/2019 0814   PCO2ART 53.4 (H) 11/23/2019 0814   PO2ART 58 (L) 11/23/2019 0814   HCO3 30.8 (H) 11/23/2019 0814   TCO2 32 11/23/2019 0814   O2SAT 89.0 11/23/2019 0814     Coagulation Profile: Recent Labs  Lab 11/22/19 1545   INR 1.1    Cardiac Enzymes: No results for input(s): CKTOTAL, CKMB, CKMBINDEX, TROPONINI in the last 168 hours.  HbA1C: Hgb A1c MFr Bld  Date/Time Value Ref Range Status  11/22/2019 07:55 PM 7.1 (H) 4.8 - 5.6 % Final    Comment:    (NOTE) Pre diabetes:          5.7%-6.4%  Diabetes:              >6.4%  Glycemic control for   <7.0% adults with diabetes     CBG: Recent Labs  Lab 11/22/19 2024 11/23/19 0046 11/23/19 0444 11/23/19 0815  GLUCAP 184* 181* 178* 158*      App cct 45 min     Brett Canales Kevin [name] ACNP Acute Care Nurse Practitioner  [facility]/Critical [facility] Please consult Amion 11/23/2019, 8:17 AM

## 2019-11-23 NOTE — Progress Notes (Signed)
Patient's wife does not want patient to have any Albuterol treatments.

## 2019-11-23 NOTE — ED Notes (Signed)
Per provider PA-minor it is okay to give haldol 2mg 

## 2019-11-23 NOTE — Progress Notes (Signed)
PHARMACY - PHYSICIAN COMMUNICATION CRITICAL VALUE ALERT - BLOOD CULTURE IDENTIFICATION (BCID)  Kevin L Tyke Outman. is an 69 y.o. male who presented to [facility] on 11/22/2019 with a chief complaint of sepsis  Assessment:  2 of 4 blood cultures + for strep species.  Name of physician (or Provider) Contacted: Mannam Current antibiotics: Vancomycin + Cefepime  Changes to prescribed antibiotics recommended:  None for now  Results for orders placed or performed during the hospital encounter of 11/22/19  Blood Culture ID Panel (Reflexed) (Collected: 11/22/2019  4:35 PM)  Result Value Ref Range   Enterococcus faecalis NOT DETECTED NOT DETECTED   Enterococcus Faecium NOT DETECTED NOT DETECTED   Listeria monocytogenes NOT DETECTED NOT DETECTED   Staphylococcus species NOT DETECTED NOT DETECTED   Staphylococcus aureus (BCID) NOT DETECTED NOT DETECTED   Staphylococcus epidermidis NOT DETECTED NOT DETECTED   Staphylococcus lugdunensis NOT DETECTED NOT DETECTED   Streptococcus species DETECTED (A) NOT DETECTED   Streptococcus agalactiae NOT DETECTED NOT DETECTED   Streptococcus pneumoniae NOT DETECTED NOT DETECTED   Streptococcus pyogenes NOT DETECTED NOT DETECTED   A.calcoaceticus-baumannii NOT DETECTED NOT DETECTED   Bacteroides fragilis NOT DETECTED NOT DETECTED   Enterobacterales NOT DETECTED NOT DETECTED   Enterobacter cloacae complex NOT DETECTED NOT DETECTED   Escherichia coli NOT DETECTED NOT DETECTED   Klebsiella aerogenes NOT DETECTED NOT DETECTED   Klebsiella oxytoca NOT DETECTED NOT DETECTED   Klebsiella pneumoniae NOT DETECTED NOT DETECTED   Proteus species NOT DETECTED NOT DETECTED   Salmonella species NOT DETECTED NOT DETECTED   Serratia marcescens NOT DETECTED NOT DETECTED   Haemophilus influenzae NOT DETECTED NOT DETECTED   Neisseria meningitidis NOT DETECTED NOT DETECTED   Pseudomonas aeruginosa NOT DETECTED NOT DETECTED   Stenotrophomonas maltophilia NOT DETECTED NOT  DETECTED   Candida albicans NOT DETECTED NOT DETECTED   Candida auris NOT DETECTED NOT DETECTED   Candida glabrata NOT DETECTED NOT DETECTED   Candida krusei NOT DETECTED NOT DETECTED   Candida parapsilosis NOT DETECTED NOT DETECTED   Candida tropicalis NOT DETECTED NOT DETECTED   Cryptococcus neoformans/gattii NOT DETECTED NOT DETECTED    Gardner Candle 11/23/2019  4:35 PM

## 2019-11-23 NOTE — Progress Notes (Signed)
°  Echocardiogram 2D Echocardiogram has been performed.  Kevin [name] 11/23/2019, 10:18 AM

## 2019-11-24 ENCOUNTER — Inpatient Hospital Stay (HOSPITAL_COMMUNITY): Payer: Medicare Other

## 2019-11-24 DIAGNOSIS — J9621 Acute and chronic respiratory failure with hypoxia: Secondary | ICD-10-CM

## 2019-11-24 LAB — CBC WITH DIFFERENTIAL/PLATELET
Abs Immature Granulocytes: 2.87 10*3/uL — ABNORMAL HIGH (ref 0.00–0.07)
Basophils Absolute: 0 10*3/uL (ref 0.0–0.1)
Basophils Relative: 0 %
Eosinophils Absolute: 0.2 10*3/uL (ref 0.0–0.5)
Eosinophils Relative: 1 %
HCT: 34.8 % — ABNORMAL LOW (ref 39.0–52.0)
Hemoglobin: 11.7 g/dL — ABNORMAL LOW (ref 13.0–17.0)
Immature Granulocytes: 8 %
Lymphocytes Relative: 4 %
Lymphs Abs: 1.4 10*3/uL (ref 0.7–4.0)
MCH: 29.5 pg (ref 26.0–34.0)
MCHC: 33.6 g/dL (ref 30.0–36.0)
MCV: 87.9 fL (ref 80.0–100.0)
Monocytes Absolute: 1.9 10*3/uL — ABNORMAL HIGH (ref 0.1–1.0)
Monocytes Relative: 5 %
Neutro Abs: 28.6 10*3/uL — ABNORMAL HIGH (ref 1.7–7.7)
Neutrophils Relative %: 82 %
Platelets: 148 10*3/uL — ABNORMAL LOW (ref 150–400)
RBC: 3.96 MIL/uL — ABNORMAL LOW (ref 4.22–5.81)
RDW: 15.1 % (ref 11.5–15.5)
WBC: 34.9 10*3/uL — ABNORMAL HIGH (ref 4.0–10.5)
nRBC: 0 % (ref 0.0–0.2)

## 2019-11-24 LAB — BASIC METABOLIC PANEL
Anion gap: 9 (ref 5–15)
BUN: 74 mg/dL — ABNORMAL HIGH (ref 8–23)
CO2: 29 mmol/L (ref 22–32)
Calcium: 8.9 mg/dL (ref 8.9–10.3)
Chloride: 102 mmol/L (ref 98–111)
Creatinine, Ser: 1.54 mg/dL — ABNORMAL HIGH (ref 0.61–1.24)
GFR calc Af Amer: 52 mL/min — ABNORMAL LOW (ref >60)
GFR calc non Af Amer: 45 mL/min — ABNORMAL LOW (ref >60)
Glucose, Bld: 184 mg/dL — ABNORMAL HIGH (ref 70–99)
Potassium: 3.6 mmol/L (ref 3.5–5.1)
Sodium: 140 mmol/L (ref 135–145)

## 2019-11-24 LAB — MAGNESIUM: Magnesium: 2.1 mg/dL (ref 1.7–2.4)

## 2019-11-24 LAB — GLUCOSE, CAPILLARY
Glucose-Capillary: 194 mg/dL — ABNORMAL HIGH (ref 70–99)
Glucose-Capillary: 171 mg/dL — ABNORMAL HIGH (ref 70–99)
Glucose-Capillary: 214 mg/dL — ABNORMAL HIGH (ref 70–99)
Glucose-Capillary: 181 mg/dL — ABNORMAL HIGH (ref 70–99)
Glucose-Capillary: 152 mg/dL — ABNORMAL HIGH (ref 70–99)

## 2019-11-24 LAB — PHOSPHORUS: Phosphorus: 3.5 mg/dL (ref 2.5–4.6)

## 2019-11-24 LAB — MRSA PCR SCREENING: MRSA by PCR: NEGATIVE

## 2019-11-24 MED ORDER — SODIUM CHLORIDE 0.9 % IV SOLN
2.0000 g | Freq: Two times a day (BID) | INTRAVENOUS | Status: DC
  Administered 2019-11-24 – 2019-11-29 (×11): 2 g via INTRAVENOUS
  Filled 2019-11-24 (×2): qty 2
  Filled 2019-11-24: qty 20
  Filled 2019-11-24 (×4): qty 2
  Filled 2019-11-24: qty 20
  Filled 2019-11-24 (×4): qty 2

## 2019-11-24 MED ORDER — IPRATROPIUM-ALBUTEROL 0.5-2.5 (3) MG/3ML IN SOLN
3.0000 mL | Freq: Three times a day (TID) | RESPIRATORY_TRACT | Status: DC
  Administered 2019-11-24: 3 mL via RESPIRATORY_TRACT
  Filled 2019-11-24: qty 3

## 2019-11-24 MED ORDER — IPRATROPIUM-ALBUTEROL 0.5-2.5 (3) MG/3ML IN SOLN
3.0000 mL | Freq: Two times a day (BID) | RESPIRATORY_TRACT | Status: DC
  Administered 2019-11-24 – 2019-12-02 (×15): 3 mL via RESPIRATORY_TRACT
  Filled 2019-11-24 (×16): qty 3

## 2019-11-24 MED ORDER — INSULIN ASPART 100 UNIT/ML ~~LOC~~ SOLN: 0.0000 [IU] | SUBCUTANEOUS | Status: DC

## 2019-11-24 MED ORDER — IOHEXOL 9 MG/ML PO SOLN
500.0000 mL | ORAL | Status: AC
  Administered 2019-11-24 (×2): 500 mL via ORAL

## 2019-11-24 MED ORDER — INSULIN ASPART 100 UNIT/ML ~~LOC~~ SOLN
0.0000 [IU] | Freq: Three times a day (TID) | SUBCUTANEOUS | Status: DC
  Administered 2019-11-24: 7 [IU] via SUBCUTANEOUS
  Administered 2019-11-24 – 2019-11-26 (×5): 4 [IU] via SUBCUTANEOUS
  Administered 2019-11-26: 7 [IU] via SUBCUTANEOUS
  Administered 2019-11-26: 4 [IU] via SUBCUTANEOUS
  Administered 2019-11-27: 3 [IU] via SUBCUTANEOUS
  Administered 2019-11-27: 4 [IU] via SUBCUTANEOUS
  Administered 2019-11-27 – 2019-11-29 (×5): 3 [IU] via SUBCUTANEOUS
  Administered 2019-11-29 – 2019-11-30 (×3): 4 [IU] via SUBCUTANEOUS
  Administered 2019-11-30: 3 [IU] via SUBCUTANEOUS
  Administered 2019-11-30 – 2019-12-01 (×3): 4 [IU] via SUBCUTANEOUS
  Administered 2019-12-02 (×3): 3 [IU] via SUBCUTANEOUS
  Administered 2019-12-03: 2 [IU] via SUBCUTANEOUS
  Administered 2019-12-03 (×2): 3 [IU] via SUBCUTANEOUS
  Administered 2019-12-04: 5 [IU] via SUBCUTANEOUS
  Administered 2019-12-04: 7 [IU] via SUBCUTANEOUS
  Administered 2019-12-04 – 2019-12-05 (×2): 4 [IU] via SUBCUTANEOUS
  Administered 2019-12-05: 7 [IU] via SUBCUTANEOUS
  Administered 2019-12-05: 4 [IU] via SUBCUTANEOUS
  Administered 2019-12-06 (×3): 3 [IU] via SUBCUTANEOUS
  Administered 2019-12-07 (×2): 4 [IU] via SUBCUTANEOUS
  Administered 2019-12-08 – 2019-12-09 (×5): 3 [IU] via SUBCUTANEOUS
  Administered 2019-12-09 – 2019-12-10 (×3): 4 [IU] via SUBCUTANEOUS
  Administered 2019-12-11: 3 [IU] via SUBCUTANEOUS
  Administered 2019-12-11 – 2019-12-13 (×3): 4 [IU] via SUBCUTANEOUS

## 2019-11-24 MED ORDER — IOHEXOL 300 MG/ML  SOLN
80.0000 mL | Freq: Once | INTRAMUSCULAR | Status: AC | PRN
  Administered 2019-11-24: 80 mL via INTRAVENOUS

## 2019-11-24 MED ORDER — SODIUM CHLORIDE 0.9 % IV SOLN
2.0000 g | Freq: Three times a day (TID) | INTRAVENOUS | Status: DC
  Administered 2019-11-24: 2 g via INTRAVENOUS
  Filled 2019-11-24 (×3): qty 2

## 2019-11-24 NOTE — Progress Notes (Signed)
NAME:  Kevin [name]., MRN:  [id number], DOB:  12/15/1950, LOS: 2 ADMISSION DATE:  11/22/2019, CONSULTATION DATE: 11/22/2019 REFERRING MD:  Redge Gainer, ED  CHIEF COMPLAINT: Lethargy and malaise  Brief History   Patient is a 69 year old male brought to the emergency room for evaluation of a 1 to 1-1/2-week history of worsening lethargy malaise decreased p.o. intake.  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 intake, vague abdominal and chest complaints of pain, diarrhea, and decreased appetite.  In the emergency room he was noted to be hypotensive.  At this point he is receiving a liter of IV fluid and is on 9 mics of Levophed.  Systolic is 100 diastolic is 80 at this point.  He is awake alert although somewhat lethargic.  He really cannot give me any specific complaints other than that mentioned above in regards to discomfort or pain. Laboratories are pertinent for glucose of 174 with a known history of type 2 diabetes mellitus, lactic acid is 2.8, troponin is 41.  Urinalysis is fairly unremarkable.  Creatinine is 3.27 with a BUN of 118.  White count is 25 hemoglobin is 11 platelet count 165.  Past Medical History   . Diabetes mellitus without complication (HCC)   . Hypertension   Untreated sleep apnea Morbid obesity Permanent pacemaker   Significant Hospital Events   Admission to [facility], ICU 11/22/2019  Consults:  PCCM  Procedures:  NA  Significant Diagnostic Tests:  As above  Micro Data:  Blood cultures planted, urinalysis unremarkable, UA pending. 825 urine culture multiple species 2 blood cultures on 825+ for strep vancomycin DC'd placed on cefepime Antimicrobials:  Currently on cefepime  Interim history/subjective:  NA  Objective   Blood pressure 120/62, pulse 64, temperature 97.6 F  (36.4 C), temperature source Oral, resp. rate 17, height 5\' 10"  (1.778 m), weight (!) 176.9 kg, SpO2 98 %.        Intake/Output Summary (Last 24 hours) at 11/24/2019 1120 Last data filed at 11/24/2019 0800 Gross per 24 hour  Intake 1231.03 ml  Output 1650 ml  Net -418.97 ml   Filed Weights   11/22/19 1456  Weight: (!) 176.9 kg    Examination: General: Morbidly obese male somewhat confused HEENT: No JVD or lymphadenopathy.  Appreciated Neuro: Strange affect but follows commands refuses to wear CPAP CV: hsd regular PULM: Sats are adequate.  He has a normal chest wall paradoxus secondary to his morbid obesity underlying sleep disorder.  GI: Protuberant abdomen positive bowel sounds GU: Voids Extremities: warm/dry, 1+ edema  Skin: no rashes or lesions    Resolved Hospital Problem list   NA  Assessment & Plan:  Acute on chronic hypoxic respiratory failure in the setting of obstructive sleep apnea with noncompliance with CPAP, further complicated by anxiety and suspected volume overload.  CPAP Serial chest x-ray O2 as needed Either needs to wear CPAP or have a tracheostomy done  Hypotension/shock in the setting of elevated procalcitonin elevated WBC unknown source but 90s been on immunosuppressant with prednisone just completed Dosepak for back pain per orthopedics. Currently off vasopressors Continue to monitor in ICU  Acute renal failure Lab Results  Component Value Date   CREATININE 1.54 (H) 11/24/2019   CREATININE 2.49 (H) 11/23/2019   CREATININE 3.27 (H) 11/22/2019   Continue to monitor creatinine Avoid  nephrotoxins  Type 2 diabetes mellitus CBG (last 3)  Recent Labs    11/24/19 0325 11/24/19 0739 11/24/19 1110  GLUCAP 194* 171* 214*    Scale insulin protocol changed to result   Best practice:  Diet: Carb modified Pain/Anxiety/Delirium protocol (if indicated): Haldol prn VAP protocol (if indicated): N/A DVT prophylaxis: SCDs/Lovenox GI  prophylaxis:NA Glucose control: Sliding scale insulin  Mobility:bedrest  Code Status: full Family Communication: Patient updated at bedside 11/24/2019. Disposition: Remain in intensive care unit for 24 hours  Labs   CBC: Recent Labs  Lab 11/22/19 1507 11/23/19 0420 11/23/19 0814 11/24/19 0121  WBC 25.9* 35.6*  --  34.9*  NEUTROABS 25.4*  --   --  28.6*  HGB 11.3* 11.3* 12.2* 11.7*  HCT 34.0* 34.0* 36.0* 34.8*  MCV 88.1 88.5  --  87.9  PLT 165 178  --  148*    Basic Metabolic Panel: Recent Labs  Lab 11/22/19 1545 11/23/19 0420 11/23/19 0814 11/24/19 0121  NA 134* 136 138 140  K 3.3* 3.5 3.5 3.6  CL 97* 100  --  102  CO2 24 26  --  29  GLUCOSE 174* 179*  --  184*  BUN 118* 102*  --  74*  CREATININE 3.27* 2.49*  --  1.54*  CALCIUM 8.7* 8.8*  --  8.9  MG  --  2.1  --  2.1  PHOS  --  4.0  --  3.5   GFR: Estimated Creatinine Clearance: 71.3 mL/min (A) (by C-G formula based on SCr of 1.54 mg/dL (H)). Recent Labs  Lab 11/22/19 1507 11/22/19 1544 11/22/19 1700 11/22/19 1954 11/23/19 0420 11/23/19 0855 11/24/19 0121  PROCALCITON  --   --   --  34.99  --   --   --   WBC 25.9*  --   --   --  35.6*  --  34.9*  LATICACIDVEN  --  2.8* 2.5*  --   --  1.6  --     Liver Function Tests: Recent Labs  Lab 11/22/19 1545  AST 42*  ALT 95*  ALKPHOS 299*  BILITOT 1.5*  PROT 5.7*  ALBUMIN 1.9*   Recent Labs  Lab 11/22/19 1545  LIPASE 50   No results for input(s): AMMONIA in the last 168 hours.  ABG    Component Value Date/Time   PHART 7.367 11/23/2019 0814   PCO2ART 53.4 (H) 11/23/2019 0814   PO2ART 58 (L) 11/23/2019 0814   HCO3 30.8 (H) 11/23/2019 0814   TCO2 32 11/23/2019 0814   O2SAT 89.0 11/23/2019 0814     Coagulation Profile: Recent Labs  Lab 11/22/19 1545  INR 1.1    Cardiac Enzymes: No results for input(s): CKTOTAL, CKMB, CKMBINDEX, TROPONINI in the last 168 hours.  HbA1C: Hgb A1c MFr Bld  Date/Time Value Ref Range Status  11/22/2019  07:55 PM 7.1 (H) 4.8 - 5.6 % Final    Comment:    (NOTE) Pre diabetes:          5.7%-6.4%  Diabetes:              >6.4%  Glycemic control for   <7.0% adults with diabetes     CBG: Recent Labs  Lab 11/23/19 1933 11/23/19 2314 11/24/19 0325 11/24/19 0739 11/24/19 1110  GLUCAP 182* 193* 194* 171* 214*      App cct 45 min     Brett Canales Choua Ikner ACNP Acute Care Nurse Practitioner [facility]/Critical [facility] Please consult Amion 11/24/2019, 11:20 AM

## 2019-11-24 NOTE — Progress Notes (Signed)
E-Link updated about patient's run of v-tach. Lab orders updated to be drawn now. Patient is a lab draw. Will contact phlebotomy if not drawn soon.

## 2019-11-24 NOTE — Progress Notes (Signed)
Run of V-tach roughly 1 min long from 0802 to 0803.  Chrissie Noa Minor NP notified.  No new orders at this time.

## 2019-11-24 NOTE — Progress Notes (Signed)
Pharmacy Antibiotic Note  Kevin [name]. is a 69 y.o. male admitted on 11/22/2019 with concern for sepsis. Pharmacy has been consulted for vancomycin and cefepime dosing.  BCID reveals Strep spp in 2 of 4 bottles.  Renal function continues to improve, afebrile, WBC 34.9.  Plan: Vanc 1750mg  IV Q24H for goal trough 15-20 mcg/mL Change cefepime to 2gm IV Q8H  Monitor renal fxn, clinical progress, vanc level as indicated, de-escalation plans   Height: 5\' 10"  (177.8 cm) Weight: (!) 176.9 kg (390 lb) IBW/kg (Calculated) : 73  Temp (24hrs), Avg:98.4 F (36.9 C), Min:97.6 F (36.4 C), Max:99 F (37.2 C)  Recent Labs  Lab 11/22/19 1507 11/22/19 1544 11/22/19 1545 11/22/19 1700 11/23/19 0420 11/23/19 0855 11/24/19 0121  WBC 25.9*  --   --   --  35.6*  --  34.9*  CREATININE  --   --  3.27*  --  2.49*  --  1.54*  LATICACIDVEN  --  2.8*  --  2.5*  --  1.6  --     Estimated Creatinine Clearance: 71.3 mL/min (A) (by C-G formula based on SCr of 1.54 mg/dL (H)).    Allergies  Allergen Reactions  . Ibuprofen Other (See Comments)    Was told by MD to not take this  . Penicillins Other (See Comments)    Patient doesn't know reaction- was always told he was allergic   . Oxycodone Itching  . Amlodipine Swelling and Other (See Comments)    Leg edema   . Benazepril Swelling and Other (See Comments)    Lips only became swollen  . Cephalexin Itching  . Mirabegron Other (See Comments) and Cough    Congestion and wheezing, also  . Percodan [Oxycodone-Aspirin] Itching  . Molds & Smuts Other (See Comments)    "RUNNNING NOSE AND EYES"   Vanc 8/24 >> Azactam 8/24>> 8/25 Cefepime 8/25 >>  8/24 COVID - neg 8/24 UCx - negative 8/24 BCx - 2/4 GPC (BCID Strep spp)  Juquan Reznick D. 9/24, PharmD, BCPS, BCCCP 11/24/2019, 9:01 AM

## 2019-11-25 ENCOUNTER — Inpatient Hospital Stay (HOSPITAL_COMMUNITY): Payer: Medicare Other

## 2019-11-25 LAB — CULTURE, BLOOD (SINGLE): Special Requests: ADEQUATE

## 2019-11-25 LAB — CBC WITH DIFFERENTIAL/PLATELET
Abs Immature Granulocytes: 3.51 10*3/uL — ABNORMAL HIGH (ref 0.00–0.07)
Basophils Absolute: 0 10*3/uL (ref 0.0–0.1)
Basophils Relative: 0 %
Eosinophils Absolute: 0.2 10*3/uL (ref 0.0–0.5)
Eosinophils Relative: 1 %
HCT: 35 % — ABNORMAL LOW (ref 39.0–52.0)
Hemoglobin: 11.7 g/dL — ABNORMAL LOW (ref 13.0–17.0)
Immature Granulocytes: 11 %
Lymphocytes Relative: 5 %
Lymphs Abs: 1.7 10*3/uL (ref 0.7–4.0)
MCH: 29.9 pg (ref 26.0–34.0)
MCHC: 33.4 g/dL (ref 30.0–36.0)
MCV: 89.5 fL (ref 80.0–100.0)
Monocytes Absolute: 1.8 10*3/uL — ABNORMAL HIGH (ref 0.1–1.0)
Monocytes Relative: 5 %
Neutro Abs: 25.8 10*3/uL — ABNORMAL HIGH (ref 1.7–7.7)
Neutrophils Relative %: 78 %
Platelets: 153 10*3/uL (ref 150–400)
RBC: 3.91 MIL/uL — ABNORMAL LOW (ref 4.22–5.81)
RDW: 15.4 % (ref 11.5–15.5)
WBC: 33.1 10*3/uL — ABNORMAL HIGH (ref 4.0–10.5)
nRBC: 0 % (ref 0.0–0.2)

## 2019-11-25 LAB — POCT I-STAT 7, (LYTES, BLD GAS, ICA,H+H)
Acid-Base Excess: 15 mmol/L — ABNORMAL HIGH (ref 0.0–2.0)
Bicarbonate: 41.4 mmol/L — ABNORMAL HIGH (ref 20.0–28.0)
Calcium, Ion: 1.29 mmol/L (ref 1.15–1.40)
HCT: 36 % — ABNORMAL LOW (ref 39.0–52.0)
Hemoglobin: 12.2 g/dL — ABNORMAL LOW (ref 13.0–17.0)
O2 Saturation: 91 %
Patient temperature: 98.7
Potassium: 3.5 mmol/L (ref 3.5–5.1)
Sodium: 145 mmol/L (ref 135–145)
TCO2: 43 mmol/L — ABNORMAL HIGH (ref 22–32)
pCO2 arterial: 56.4 mmHg — ABNORMAL HIGH (ref 32.0–48.0)
pH, Arterial: 7.474 — ABNORMAL HIGH (ref 7.350–7.450)
pO2, Arterial: 59 mmHg — ABNORMAL LOW (ref 83.0–108.0)

## 2019-11-25 LAB — CULTURE, BLOOD (ROUTINE X 2)

## 2019-11-25 LAB — COMPREHENSIVE METABOLIC PANEL
ALT: 57 U/L — ABNORMAL HIGH (ref 0–44)
AST: 38 U/L (ref 15–41)
Albumin: 1.7 g/dL — ABNORMAL LOW (ref 3.5–5.0)
Alkaline Phosphatase: 246 U/L — ABNORMAL HIGH (ref 38–126)
Anion gap: 11 (ref 5–15)
BUN: 39 mg/dL — ABNORMAL HIGH (ref 8–23)
CO2: 31 mmol/L (ref 22–32)
Calcium: 9.4 mg/dL (ref 8.9–10.3)
Chloride: 100 mmol/L (ref 98–111)
Creatinine, Ser: 0.98 mg/dL (ref 0.61–1.24)
GFR calc Af Amer: >60 mL/min (ref >60)
GFR calc non Af Amer: >60 mL/min (ref >60)
Glucose, Bld: 173 mg/dL — ABNORMAL HIGH (ref 70–99)
Potassium: 3.5 mmol/L (ref 3.5–5.1)
Sodium: 142 mmol/L (ref 135–145)
Total Bilirubin: 1.2 mg/dL (ref 0.3–1.2)
Total Protein: 5.7 g/dL — ABNORMAL LOW (ref 6.5–8.1)

## 2019-11-25 LAB — PHOSPHORUS: Phosphorus: 2.8 mg/dL (ref 2.5–4.6)

## 2019-11-25 LAB — GLUCOSE, CAPILLARY
Glucose-Capillary: 175 mg/dL — ABNORMAL HIGH (ref 70–99)
Glucose-Capillary: 166 mg/dL — ABNORMAL HIGH (ref 70–99)
Glucose-Capillary: 125 mg/dL — ABNORMAL HIGH (ref 70–99)

## 2019-11-25 LAB — MAGNESIUM: Magnesium: 1.8 mg/dL (ref 1.7–2.4)

## 2019-11-25 MED ORDER — FUROSEMIDE 10 MG/ML IJ SOLN
40.0000 mg | Freq: Once | INTRAMUSCULAR | Status: AC
  Administered 2019-11-25: 40 mg via INTRAVENOUS
  Filled 2019-11-25: qty 4

## 2019-11-25 NOTE — Progress Notes (Signed)
PT Cancellation Note  Patient Details Name: Kevin [name]. MRN: [id number] DOB: 03-27-1951   Cancelled Treatment:    Reason Eval/Treat Not Completed: Medical issues which prohibited therapy; RN reports just had to be placed on Bipap as pt hypercarbic and more lethargic later in the pm.  Will attempt again another day.   Elray Mcgregor 11/25/2019, 4:11 PM  Sheran Lawless, PT Acute Rehabilitation Services [phone] Office:[phone] 11/25/2019

## 2019-11-25 NOTE — Progress Notes (Signed)
NAME:  Kevin [name]., MRN:  [id number], DOB:  [birth year], LOS: 3 ADMISSION DATE:  11/22/2019, CONSULTATION DATE: 11/22/2019 REFERRING MD:  Redge Gainer, ED  CHIEF COMPLAINT: Lethargy and malaise  Brief History   Patient is a 69 year old male brought to the emergency room for evaluation of a 1 to 1-1/2-week history of worsening lethargy malaise decreased p.o. intake.  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 intake, vague abdominal and chest complaints of pain, diarrhea, and decreased appetite.  In the emergency room he was noted to be hypotensive.  At this point he is receiving a liter of IV fluid and is on 9 mics of Levophed.  Systolic is 100 diastolic is 80 at this point.  He is awake alert although somewhat lethargic.  He really cannot give me any specific complaints other than that mentioned above in regards to discomfort or pain. Laboratories are pertinent for glucose of 174 with a known history of type 2 diabetes mellitus, lactic acid is 2.8, troponin is 41.  Urinalysis is fairly unremarkable.  Creatinine is 3.27 with a BUN of 118.  White count is 25 hemoglobin is 11 platelet count 165.  Past Medical History   . Diabetes mellitus without complication (HCC)   . Hypertension   Untreated sleep apnea Morbid obesity Permanent pacemaker   Significant Hospital Events   Admission to [facility], ICU 11/22/2019  Consults:  PCCM  Procedures:  NA  Significant Diagnostic Tests:  As above  Micro Data:  Blood cultures planted, urinalysis unremarkable, UA pending. 825 urine culture multiple species 2 blood cultures on 825+ for strep vancomycin DC'd placed on Rocephin Antimicrobials:  Currently on Rocephin  Interim history/subjective:  Continues to have abdominal chest wall paradoxus.  No further long runs  of V. tach.  He is no longer hypotensive appears to be volume overloaded we will do a gentle diuresis hopefully this will improve his breathing.  Objective   Blood pressure 139/81, pulse 72, temperature (!) 97.5 F (36.4 C), temperature source Oral, resp. rate (!) 28, height 5\' 10"  (1.778 m), weight (!) 176.9 kg, SpO2 95 %.        Intake/Output Summary (Last 24 hours) at 11/25/2019 11/27/2019 Last data filed at 11/25/2019 0600 Gross per 24 hour  Intake 1039.87 ml  Output 2660 ml  Net -1620.13 ml   Filed Weights   11/22/19 1456  Weight: (!) 176.9 kg    Examination: General: Morbidly obese male who is somewhat more interactive today HEENT: No JVD or lymphadenopathy is appreciated Neuro: More interactive but still with some confusion requires close observation CV: Heart sounds are distant but regular PULM: Diminished in the bases GI: soft, bsx4 active, obese GU: Foley with amber urine Extremities: warm/dry, 12+ edema  Skin: no rashes or lesions     Resolved Hospital Problem list   NA  Assessment & Plan:  Acute on chronic hypoxic respiratory failure in the setting of obstructive sleep apnea with noncompliance with CPAP, further complicated by anxiety and suspected volume overload.  Refuses to wear CPAP Serial chest x-ray Oxygen as needed to keep sats greater than 88% Most likely he will require tracheostomy in the future  Hypotension/shock in the setting of elevated procalcitonin elevated WBC unknown source but 90s been on immunosuppressant with prednisone just completed Dosepak for back pain per orthopedics.  Blood cultures positive for strep Currently off vasopressors Currently on Rocephin blood cultures positive for strep    Acute renal failure Lab Results  Component Value Date   CREATININE 0.98 11/25/2019   CREATININE 1.54 (H) 11/24/2019   CREATININE 2.49 (H) 11/23/2019   Renal function has returned to normal  Type 2 diabetes mellitus CBG (last 3)  Recent Labs     11/24/19 1618 11/24/19 2133 11/25/19 0742  GLUCAP 181* 152* 175*    Sliding-scale insulin protocol   Best practice:  Diet: Carb modified Pain/Anxiety/Delirium protocol (if indicated): Haldol prn VAP protocol (if indicated): N/A DVT prophylaxis: SCDs/Lovenox GI prophylaxis:NA Glucose control: Sliding scale insulin  Mobility:bedrest  Code Status: full Family Communication: Patient updated at bedside 11/25/2019 Disposition: Consider transferring to neuro stepdown unit 11/25/2019  Labs   CBC: Recent Labs  Lab 11/22/19 1507 11/23/19 0420 11/23/19 0814 11/24/19 0121 11/25/19 0415  WBC 25.9* 35.6*  --  34.9* 33.1*  NEUTROABS 25.4*  --   --  28.6* 25.8*  HGB 11.3* 11.3* 12.2* 11.7* 11.7*  HCT 34.0* 34.0* 36.0* 34.8* 35.0*  MCV 88.1 88.5  --  87.9 89.5  PLT 165 178  --  148* 153    Basic Metabolic Panel: Recent Labs  Lab 11/22/19 1545 11/23/19 0420 11/23/19 0814 11/24/19 0121 11/25/19 0415  NA 134* 136 138 140 142  K 3.3* 3.5 3.5 3.6 3.5  CL 97* 100  --  102 100  CO2 24 26  --  29 31  GLUCOSE 174* 179*  --  184* 173*  BUN 118* 102*  --  74* 39*  CREATININE 3.27* 2.49*  --  1.54* 0.98  CALCIUM 8.7* 8.8*  --  8.9 9.4  MG  --  2.1  --  2.1 1.8  PHOS  --  4.0  --  3.5 2.8   GFR: Estimated Creatinine Clearance: 112.1 mL/min (by C-G formula based on SCr of 0.98 mg/dL). Recent Labs  Lab 11/22/19 1507 11/22/19 1544 11/22/19 1700 11/22/19 1954 11/23/19 0420 11/23/19 0855 11/24/19 0121 11/25/19 0415  PROCALCITON  --   --   --  34.99  --   --   --   --   WBC 25.9*  --   --   --  35.6*  --  34.9* 33.1*  LATICACIDVEN  --  2.8* 2.5*  --   --  1.6  --   --     Liver Function Tests: Recent Labs  Lab 11/22/19 1545 11/25/19 0415  AST 42* 38  ALT 95* 57*  ALKPHOS 299* 246*  BILITOT 1.5* 1.2  PROT 5.7* 5.7*  ALBUMIN 1.9* 1.7*   Recent Labs  Lab 11/22/19 1545  LIPASE 50   No results for input(s): AMMONIA in the last 168 hours.  ABG    Component Value  Date/Time   PHART 7.367 11/23/2019 0814   PCO2ART 53.4 (H) 11/23/2019 0814   PO2ART 58 (L) 11/23/2019 0814   HCO3 30.8 (H) 11/23/2019 0814   TCO2 32 11/23/2019 0814   O2SAT 89.0 11/23/2019 0814     Coagulation Profile: Recent Labs  Lab 11/22/19 1545  INR 1.1    Cardiac Enzymes: No results for input(s): CKTOTAL, CKMB, CKMBINDEX, TROPONINI in the last 168 hours.  HbA1C: Hgb A1c MFr Bld  Date/Time Value Ref Range Status  11/22/2019 07:55 PM 7.1 (H) 4.8 - 5.6 % Final    Comment:    (NOTE) Pre diabetes:          5.7%-6.4%  Diabetes:              >6.4%  Glycemic control for   <7.0% adults with diabetes     CBG: Recent Labs  Lab 11/24/19 0739 11/24/19 1110 11/24/19 1618 11/24/19 2133 11/25/19 0742  GLUCAP 171* 214* 181* 152* 175*      App cct 30 min     Brett Canales Jolie Strohecker ACNP Acute Care Nurse Practitioner [facility] Please consult Amion 11/25/2019, 9:03 AM

## 2019-11-25 NOTE — Progress Notes (Signed)
RT placed patient on bipap per MD order. Patient says his breathing is comfortable on bipap at this time and is tolerating well. RN aware. RT will continue to monitor.

## 2019-11-26 ENCOUNTER — Inpatient Hospital Stay (HOSPITAL_COMMUNITY): Payer: Medicare Other

## 2019-11-26 DIAGNOSIS — L8990 Pressure ulcer of unspecified site, unspecified stage: Secondary | ICD-10-CM | POA: Insufficient documentation

## 2019-11-26 LAB — CBC WITH DIFFERENTIAL/PLATELET
Abs Immature Granulocytes: 2.09 10*3/uL — ABNORMAL HIGH (ref 0.00–0.07)
Basophils Absolute: 0 10*3/uL (ref 0.0–0.1)
Basophils Relative: 0 %
Eosinophils Absolute: 0.2 10*3/uL (ref 0.0–0.5)
Eosinophils Relative: 1 %
HCT: 33.9 % — ABNORMAL LOW (ref 39.0–52.0)
Hemoglobin: 11.2 g/dL — ABNORMAL LOW (ref 13.0–17.0)
Immature Granulocytes: 12 %
Lymphocytes Relative: 9 %
Lymphs Abs: 1.7 10*3/uL (ref 0.7–4.0)
MCH: 29.9 pg (ref 26.0–34.0)
MCHC: 33 g/dL (ref 30.0–36.0)
MCV: 90.6 fL (ref 80.0–100.0)
Monocytes Absolute: 1.1 10*3/uL — ABNORMAL HIGH (ref 0.1–1.0)
Monocytes Relative: 6 %
Neutro Abs: 13.1 10*3/uL — ABNORMAL HIGH (ref 1.7–7.7)
Neutrophils Relative %: 72 %
Platelets: 186 10*3/uL (ref 150–400)
RBC: 3.74 MIL/uL — ABNORMAL LOW (ref 4.22–5.81)
RDW: 15.2 % (ref 11.5–15.5)
WBC: 18.2 10*3/uL — ABNORMAL HIGH (ref 4.0–10.5)
nRBC: 0.1 % (ref 0.0–0.2)

## 2019-11-26 LAB — COMPREHENSIVE METABOLIC PANEL
ALT: 45 U/L — ABNORMAL HIGH (ref 0–44)
AST: 30 U/L (ref 15–41)
Albumin: 1.7 g/dL — ABNORMAL LOW (ref 3.5–5.0)
Alkaline Phosphatase: 207 U/L — ABNORMAL HIGH (ref 38–126)
Anion gap: 11 (ref 5–15)
BUN: 34 mg/dL — ABNORMAL HIGH (ref 8–23)
CO2: 37 mmol/L — ABNORMAL HIGH (ref 22–32)
Calcium: 9.5 mg/dL (ref 8.9–10.3)
Chloride: 100 mmol/L (ref 98–111)
Creatinine, Ser: 1.02 mg/dL (ref 0.61–1.24)
GFR calc Af Amer: >60 mL/min (ref >60)
GFR calc non Af Amer: >60 mL/min (ref >60)
Glucose, Bld: 168 mg/dL — ABNORMAL HIGH (ref 70–99)
Potassium: 3.5 mmol/L (ref 3.5–5.1)
Sodium: 148 mmol/L — ABNORMAL HIGH (ref 135–145)
Total Bilirubin: 1.1 mg/dL (ref 0.3–1.2)
Total Protein: 5.9 g/dL — ABNORMAL LOW (ref 6.5–8.1)

## 2019-11-26 LAB — MAGNESIUM: Magnesium: 1.7 mg/dL (ref 1.7–2.4)

## 2019-11-26 LAB — GLUCOSE, CAPILLARY
Glucose-Capillary: 177 mg/dL — ABNORMAL HIGH (ref 70–99)
Glucose-Capillary: 214 mg/dL — ABNORMAL HIGH (ref 70–99)
Glucose-Capillary: 162 mg/dL — ABNORMAL HIGH (ref 70–99)
Glucose-Capillary: 136 mg/dL — ABNORMAL HIGH (ref 70–99)

## 2019-11-26 LAB — PHOSPHORUS: Phosphorus: 2.6 mg/dL (ref 2.5–4.6)

## 2019-11-26 MED ORDER — POTASSIUM CHLORIDE CRYS ER 20 MEQ PO TBCR
40.0000 meq | EXTENDED_RELEASE_TABLET | Freq: Once | ORAL | Status: AC
  Administered 2019-11-26: 40 meq via ORAL
  Filled 2019-11-26: qty 2

## 2019-11-26 MED ORDER — ACETAMINOPHEN 325 MG PO TABS
650.0000 mg | ORAL_TABLET | Freq: Four times a day (QID) | ORAL | Status: DC | PRN
  Administered 2019-11-26 – 2019-12-13 (×33): 650 mg via ORAL
  Filled 2019-11-26 (×35): qty 2

## 2019-11-26 MED ORDER — MAGNESIUM SULFATE 2 GM/50ML IV SOLN
2.0000 g | Freq: Once | INTRAVENOUS | Status: AC
  Administered 2019-11-26: 2 g via INTRAVENOUS
  Filled 2019-11-26: qty 50

## 2019-11-26 NOTE — Evaluation (Signed)
Physical Therapy Evaluation Patient Details Name: Kevin [name]. MRN: [id number] DOB: 05/29/1950 Today's Date: 11/26/2019   History of Present Illness  69 year old male brought to the emergency room for evaluation of a 1 to 1-1/2-week history of worsening lethargy malaise decreased p.o. intake. 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  Clinical Impression  Pt presents to PT with deficits in functional mobility, gait, balance, endurance, strength, power, cardiopulmonary function, and cognition. Pt is generally weak and confused at this time, requiring physical assistance to perform all functional mobility. Pt is at a high falls risk due to confusion and weakness and will benefit from PT POC to improve mobility quality and to aide in a return to the pt's prior level of function. PT recommends SNF placement at this time due to high falls risk and impaired activity tolerance.    Follow Up Recommendations SNF;Supervision/Assistance - 24 hour (may progress to CIR level vs home)    Equipment Recommendations  Hospital bed;Wheelchair (measurements PT);Wheelchair cushion (measurements PT);Other (comment) (mechanical lift, all if home today)    Recommendations for Other Services       Precautions / Restrictions Precautions Precautions: Fall Precaution Comments: monitor SpO2 Restrictions Weight Bearing Restrictions: No      Mobility  Bed Mobility Overal bed mobility: Needs Assistance Bed Mobility: Supine to Sit;Rolling Rolling: Min assist   Supine to sit: Mod assist     General bed mobility comments: pt pulls through PT UE and utilizes bed rail to elevate trunk, PT assists to pivot hips as pt close to edge of bed  Transfers Overall transfer level: Needs assistance Equipment used: 1 person hand held assist Transfers: Sit to/from Stand Sit to Stand: Mod assist;From elevated surface             Ambulation/Gait Ambulation/Gait assistance: Min assist Gait Distance (Feet): 2 Feet Assistive device: 1 person hand held assist Gait Pattern/deviations: Shuffle Gait velocity: reduced Gait velocity interpretation: <1.31 ft/sec, indicative of household ambulator General Gait Details: pt takes 2 side steps toward left side at edge of bed  Stairs            Wheelchair Mobility    Modified Rankin (Stroke Patients Only)       Balance Overall balance assessment: Needs assistance Sitting-balance support: Single extremity supported;Feet supported Sitting balance-Leahy Scale: Poor Sitting balance - Comments: minG-minA at edge of bed   Standing balance support: Bilateral upper extremity supported Standing balance-Leahy Scale: Poor Standing balance comment: reliant on UE support and min-modA                             Pertinent Vitals/Pain Pain Assessment: Faces Faces Pain Scale: Hurts even more Pain Location: neck Pain Descriptors / Indicators: Grimacing Pain Intervention(s): Monitored during session    Home Living Family/patient expects to be discharged to:: Private residence Living Arrangements: Spouse/significant other Available Help at Discharge: Family;Available 24 hours/day Type of Home: House Home Access: Stairs to enter Entrance Stairs-Rails: None Entrance Stairs-Number of Steps: 1 Home Layout: Two level Home Equipment: Walker - 2 wheels      Prior Function Level of Independence: Independent with assistive device(s)         Comments: pt reports ambulating with use of RW recently (the past 34 days). Pt is very confused and history needs to be confirmed with family     Hand Dominance  Extremity/Trunk Assessment   Upper Extremity Assessment Upper Extremity Assessment: Generalized weakness    Lower Extremity Assessment Lower Extremity Assessment: Generalized weakness    Cervical / Trunk Assessment Cervical / Trunk  Assessment: Other exceptions Cervical / Trunk Exceptions: morbid obesity  Communication   Communication: No difficulties  Cognition Arousal/Alertness: Awake/alert Behavior During Therapy: Flat affect Overall Cognitive Status: Impaired/Different from baseline Area of Impairment: Orientation;Attention;Memory;Following commands;Safety/judgement;Awareness;Problem solving                 Orientation Level: Disoriented to;Time;Situation Current Attention Level: Focused Memory: Decreased recall of precautions;Decreased short-term memory Following Commands: Follows one step commands with increased time Safety/Judgement: Decreased awareness of safety;Decreased awareness of deficits Awareness: Intellectual Problem Solving: Slow processing;Requires verbal cues        General Comments General comments (skin integrity, edema, etc.): pt on 5L [place] during session, VSS    Exercises     Assessment/Plan    PT Assessment Patient needs continued PT services  PT Problem List Decreased strength;Decreased activity tolerance;Decreased balance;Decreased mobility;Decreased cognition;Decreased knowledge of use of DME;Decreased safety awareness;Decreased knowledge of precautions;Cardiopulmonary status limiting activity;Pain       PT Treatment Interventions DME instruction;Gait training;Stair training;Functional mobility training;Therapeutic activities;Therapeutic exercise;Balance training;Neuromuscular re-education;Cognitive remediation;Patient/family education    PT Goals (Current goals can be found in the Care Plan section)  Acute Rehab PT Goals Patient Stated Goal: To improve strength and activity tolerance PT Goal Formulation: With patient Time For Goal Achievement: 12/10/19 Potential to Achieve Goals: Good    Frequency Min 3X/week   Barriers to discharge        Co-evaluation               AM-PAC PT "6 Clicks" Mobility  Outcome Measure Help needed turning from your back to your  side while in a flat bed without using bedrails?: A Lot Help needed moving from lying on your back to sitting on the side of a flat bed without using bedrails?: A Lot Help needed moving to and from a bed to a chair (including a wheelchair)?: A Lot Help needed standing up from a chair using your arms (e.g., wheelchair or bedside chair)?: A Lot Help needed to walk in hospital room?: A Lot Help needed climbing 3-5 steps with a railing? : Total 6 Click Score: 11    End of Session Equipment Utilized During Treatment: Oxygen Activity Tolerance: Patient tolerated treatment well Patient left: in bed;with call bell/phone within reach;with bed alarm set Nurse Communication: Mobility status PT Visit Diagnosis: Unsteadiness on feet (R26.81);Other abnormalities of gait and mobility (R26.89);Muscle weakness (generalized) (M62.81)    Time: 5374-8270 PT Time Calculation (min) (ACUTE ONLY): 19 min   Charges:   PT Evaluation $PT Eval Moderate Complexity: 1 Mod          Arlyss Gandy, PT, DPT Acute Rehabilitation Pager: [phone]   Arlyss Gandy 11/26/2019, 3:46 PM

## 2019-11-26 NOTE — Progress Notes (Signed)
NAME:  Kevin Bartell., MRN:  [id number], DOB:  1950/12/12, LOS: 4 ADMISSION DATE:  11/22/2019, CONSULTATION DATE: 11/22/2019 REFERRING MD:  Redge Gainer, ED  CHIEF COMPLAINT: Lethargy and malaise  Brief History   Patient is a 69 year old male brought to the emergency room for evaluation of a 1 to 1-1/2-week history of worsening lethargy malaise decreased p.o. intake.  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 intake, vague abdominal and chest complaints of pain, diarrhea, and decreased appetite.  In the emergency room he was noted to be hypotensive.  At this point he is receiving a liter of IV fluid and is on 9 mics of Levophed.  Systolic is 100 diastolic is 80 at this point.  He is awake alert although somewhat lethargic.  He really cannot give me any specific complaints other than that mentioned above in regards to discomfort or pain. Laboratories are pertinent for glucose of 174 with a known history of type 2 diabetes mellitus, lactic acid is 2.8, troponin is 41.  Urinalysis is fairly unremarkable.  Creatinine is 3.27 with a BUN of 118.  White count is 25 hemoglobin is 11 platelet count 165.  Past Medical History   . Diabetes mellitus without complication (HCC)   . Hypertension   Untreated sleep apnea Morbid obesity Permanent pacemaker   Significant Hospital Events   Admission to [facility], ICU 11/22/2019  Consults:  PCCM  Procedures:  NA  Significant Diagnostic Tests:  As above  Micro Data:  Blood cultures planted, urinalysis unremarkable, UA pending. 825 urine culture multiple species 2 blood cultures on 825+ for strep vancomycin DC'd placed on Rocephin Antimicrobials:  Currently on Rocephin  Interim history/subjective:  Initiated on nocturnal BiPAP for suspected OSA.  Tolerated well  overnight.  No voiced complaints today.  Still mild confusion.  Objective   Blood pressure 111/82, pulse 79, temperature 98.2 F (36.8 C), temperature source Axillary, resp. rate 18, height 5\' 10"  (1.778 m), weight (!) 176.9 kg, SpO2 98 %.    FiO2 (%):  [50 %] 50 %   Intake/Output Summary (Last 24 hours) at 11/26/2019 1019 Last data filed at 11/25/2019 2200 Gross per 24 hour  Intake 220 ml  Output 1875 ml  Net -1655 ml   Filed Weights   11/22/19 1456  Weight: (!) 176.9 kg    Examination: General: Morbidly obese male who is somewhat more interactive today HEENT: No JVD or lymphadenopathy is appreciated Neuro: Awake follows commands oriented but requires some coaching.  No focal deficits.  Good strength in all four extremities. CV: Heart sounds are distant but regular PULM: Diminished in the bases GI: soft, bsx4 active, obese GU: Foley with amber urine Extremities: warm/dry, 12+ edema  Skin: no rashes or lesions   Resolved Hospital Problem list   NA  Assessment & Plan:  Acute on chronic hypoxic respiratory failure in the setting of obstructive sleep apnea with noncompliance with CPAP, further complicated by anxiety and suspected volume overload. -Tolerating BiPAP. -May take several days for mentation to improve.  Hypotension/shock in the setting of elevated procalcitonin elevated WBC unknown source but 90s been on immunosuppressant with prednisone just completed Dosepak for back pain per orthopedics. Blood cultures positive for strep Improving leukocytosis. Currently off vasopressors Currently on Rocephin blood cultures positive for strep TEE/MRI C-spine to rule out metastatic  infections to determine duration of therapy and need for further intervention.  Best practice:  Diet: Carb modified Pain/Anxiety/Delirium protocol (if indicated): Haldol prn VAP protocol (if indicated): N/A DVT prophylaxis: SCDs/Lovenox GI prophylaxis:NA Glucose control: Sliding scale  insulin for type 2 diabetes with good control. Mobility:bedrest  Code Status: full Family Communication: Wife updated at bedside today. Disposition: ICU.  Keep in ICU until TEE MRI complete as may require sedation with risk of airway compromise.  Labs   CBC: Recent Labs  Lab 11/22/19 1507 11/22/19 1507 11/23/19 0420 11/23/19 0420 11/23/19 0814 11/24/19 0121 11/25/19 0415 11/25/19 1741 11/26/19 0344  WBC 25.9*  --  35.6*  --   --  34.9* 33.1*  --  18.2*  NEUTROABS 25.4*  --   --   --   --  28.6* 25.8*  --  13.1*  HGB 11.3*   < > 11.3*   < > 12.2* 11.7* 11.7* 12.2* 11.2*  HCT 34.0*   < > 34.0*   < > 36.0* 34.8* 35.0* 36.0* 33.9*  MCV 88.1  --  88.5  --   --  87.9 89.5  --  90.6  PLT 165  --  178  --   --  148* 153  --  186   < > = values in this interval not displayed.    Basic Metabolic Panel: Recent Labs  Lab 11/22/19 1545 11/22/19 1545 11/23/19 0420 11/23/19 0420 11/23/19 0814 11/24/19 0121 11/25/19 0415 11/25/19 1741 11/26/19 0344  NA 134*   < > 136   < > 138 140 142 145 148*  K 3.3*   < > 3.5   < > 3.5 3.6 3.5 3.5 3.5  CL 97*  --  100  --   --  102 100  --  100  CO2 24  --  26  --   --  29 31  --  37*  GLUCOSE 174*  --  179*  --   --  184* 173*  --  168*  BUN 118*  --  102*  --   --  74* 39*  --  34*  CREATININE 3.27*  --  2.49*  --   --  1.54* 0.98  --  1.02  CALCIUM 8.7*  --  8.8*  --   --  8.9 9.4  --  9.5  MG  --   --  2.1  --   --  2.1 1.8  --  1.7  PHOS  --   --  4.0  --   --  3.5 2.8  --  2.6   < > = values in this interval not displayed.   GFR: Estimated Creatinine Clearance: 107.7 mL/min (by C-G formula based on SCr of 1.02 mg/dL). Recent Labs  Lab 11/22/19 1507 11/22/19 1544 11/22/19 1700 11/22/19 1954 11/23/19 0420 11/23/19 0855 11/24/19 0121 11/25/19 0415 11/26/19 0344  PROCALCITON  --   --   --  34.99  --   --   --   --   --   WBC   < >  --   --   --  35.6*  --  34.9* 33.1* 18.2*  LATICACIDVEN  --  2.8* 2.5*  --   --  1.6  --   --    --    < > = values in this interval not displayed.    Liver Function Tests: Recent Labs  Lab 11/22/19 1545 11/25/19 0415 11/26/19 0344  AST 42* 38  30  ALT 95* 57* 45*  ALKPHOS 299* 246* 207*  BILITOT 1.5* 1.2 1.1  PROT 5.7* 5.7* 5.9*  ALBUMIN 1.9* 1.7* 1.7*   Recent Labs  Lab 11/22/19 1545  LIPASE 50   No results for input(s): AMMONIA in the last 168 hours.  ABG    Component Value Date/Time   PHART 7.474 (H) 11/25/2019 1741   PCO2ART 56.4 (H) 11/25/2019 1741   PO2ART 59 (L) 11/25/2019 1741   HCO3 41.4 (H) 11/25/2019 1741   TCO2 43 (H) 11/25/2019 1741   O2SAT 91.0 11/25/2019 1741     Coagulation Profile: Recent Labs  Lab 11/22/19 1545  INR 1.1    Cardiac Enzymes: No results for input(s): CKTOTAL, CKMB, CKMBINDEX, TROPONINI in the last 168 hours.  HbA1C: Hgb A1c MFr Bld  Date/Time Value Ref Range Status  11/22/2019 07:55 PM 7.1 (H) 4.8 - 5.6 % Final    Comment:    (NOTE) Pre diabetes:          5.7%-6.4%  Diabetes:              >6.4%  Glycemic control for   <7.0% adults with diabetes     CBG: Recent Labs  Lab 11/24/19 2133 11/25/19 0742 11/25/19 1121 11/25/19 2153 11/26/19 0740  GLUCAP 152* 175* 166* 125* 177*    Lynnell Catalan, MD [facility] ICU Physician [facility]  Pager: [phone] Mobile: [phone] After hours: [phone].  11/26/2019, 10:29 AM      11/26/2019, 10:19 AM

## 2019-11-27 LAB — COMPREHENSIVE METABOLIC PANEL
ALT: 40 U/L (ref 0–44)
AST: 28 U/L (ref 15–41)
Albumin: 1.8 g/dL — ABNORMAL LOW (ref 3.5–5.0)
Alkaline Phosphatase: 218 U/L — ABNORMAL HIGH (ref 38–126)
Anion gap: 10 (ref 5–15)
BUN: 27 mg/dL — ABNORMAL HIGH (ref 8–23)
CO2: 34 mmol/L — ABNORMAL HIGH (ref 22–32)
Calcium: 9.2 mg/dL (ref 8.9–10.3)
Chloride: 99 mmol/L (ref 98–111)
Creatinine, Ser: 0.85 mg/dL (ref 0.61–1.24)
GFR calc Af Amer: >60 mL/min (ref >60)
GFR calc non Af Amer: >60 mL/min (ref >60)
Glucose, Bld: 148 mg/dL — ABNORMAL HIGH (ref 70–99)
Potassium: 3.8 mmol/L (ref 3.5–5.1)
Sodium: 143 mmol/L (ref 135–145)
Total Bilirubin: 1 mg/dL (ref 0.3–1.2)
Total Protein: 6.1 g/dL — ABNORMAL LOW (ref 6.5–8.1)

## 2019-11-27 LAB — CBC WITH DIFFERENTIAL/PLATELET
Abs Immature Granulocytes: 1.08 10*3/uL — ABNORMAL HIGH (ref 0.00–0.07)
Basophils Absolute: 0.1 10*3/uL (ref 0.0–0.1)
Basophils Relative: 1 %
Eosinophils Absolute: 0.2 10*3/uL (ref 0.0–0.5)
Eosinophils Relative: 1 %
HCT: 35.9 % — ABNORMAL LOW (ref 39.0–52.0)
Hemoglobin: 11.4 g/dL — ABNORMAL LOW (ref 13.0–17.0)
Immature Granulocytes: 7 %
Lymphocytes Relative: 12 %
Lymphs Abs: 1.8 10*3/uL (ref 0.7–4.0)
MCH: 29.5 pg (ref 26.0–34.0)
MCHC: 31.8 g/dL (ref 30.0–36.0)
MCV: 93 fL (ref 80.0–100.0)
Monocytes Absolute: 0.8 10*3/uL (ref 0.1–1.0)
Monocytes Relative: 5 %
Neutro Abs: 11.6 10*3/uL — ABNORMAL HIGH (ref 1.7–7.7)
Neutrophils Relative %: 74 %
Platelets: 188 10*3/uL (ref 150–400)
RBC: 3.86 MIL/uL — ABNORMAL LOW (ref 4.22–5.81)
RDW: 15.2 % (ref 11.5–15.5)
WBC: 15.6 10*3/uL — ABNORMAL HIGH (ref 4.0–10.5)
nRBC: 0 % (ref 0.0–0.2)

## 2019-11-27 LAB — GLUCOSE, CAPILLARY
Glucose-Capillary: 137 mg/dL — ABNORMAL HIGH (ref 70–99)
Glucose-Capillary: 178 mg/dL — ABNORMAL HIGH (ref 70–99)
Glucose-Capillary: 138 mg/dL — ABNORMAL HIGH (ref 70–99)
Glucose-Capillary: 135 mg/dL — ABNORMAL HIGH (ref 70–99)

## 2019-11-27 MED ORDER — SODIUM CHLORIDE 0.9 % IV SOLN: INTRAVENOUS | Status: DC

## 2019-11-27 MED ORDER — DICLOFENAC SODIUM 1 % EX GEL
2.0000 g | Freq: Four times a day (QID) | CUTANEOUS | Status: DC | PRN
  Administered 2019-11-27: 2 g via TOPICAL
  Administered 2019-11-28: 4 g via TOPICAL
  Administered 2019-11-28: 2 g via TOPICAL
  Administered 2019-11-30 – 2019-12-01 (×3): 4 g via TOPICAL
  Administered 2019-12-01: 2 g via TOPICAL
  Administered 2019-12-02 (×2): 4 g via TOPICAL
  Administered 2019-12-03 – 2019-12-07 (×3): 2 g via TOPICAL
  Administered 2019-12-07 – 2019-12-08 (×2): 4 g via TOPICAL
  Administered 2019-12-10 – 2019-12-13 (×4): 2 g via TOPICAL
  Filled 2019-11-27: qty 100

## 2019-11-27 NOTE — Progress Notes (Signed)
NAME:  Kevin Umanzor., MRN:  [id number], DOB:  1950-12-28, LOS: 5 ADMISSION DATE:  11/22/2019, CONSULTATION DATE: 11/22/2019 REFERRING MD:  Redge Gainer, ED  CHIEF COMPLAINT: Lethargy and malaise  Brief History   Patient is a 69 year old male brought to the emergency room for evaluation of a 1 to 1-1/2-week history of worsening lethargy malaise decreased p.o. intake.  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 intake, vague abdominal and chest complaints of pain, diarrhea, and decreased appetite.  In the emergency room he was noted to be hypotensive.  At this point he is receiving a liter of IV fluid and is on 9 mics of Levophed.  Systolic is 100 diastolic is 80 at this point.  He is awake alert although somewhat lethargic.  He really cannot give me any specific complaints other than that mentioned above in regards to discomfort or pain. Laboratories are pertinent for glucose of 174 with a known history of type 2 diabetes mellitus, lactic acid is 2.8, troponin is 41.  Urinalysis is fairly unremarkable.  Creatinine is 3.27 with a BUN of 118.  White count is 25 hemoglobin is 11 platelet count 165.  Past Medical History    Diabetes mellitus without complication (HCC)    Hypertension   Untreated sleep apnea Morbid obesity Permanent pacemaker   Significant Hospital Events   Admission to [facility], ICU 11/22/2019  Consults:  PCCM  Procedures:  NA  Significant Diagnostic Tests:  As above  Micro Data:  Blood cultures planted, urinalysis unremarkable, UA pending. 825 urine culture multiple species 2 blood cultures on 825+ for strep vancomycin DC'd placed on Rocephin Antimicrobials:  Currently on Rocephin  Interim history/subjective:  Initiated on nocturnal BiPAP for suspected OSA.  Tolerated well  overnight. More awake and interactive.  Objective   Blood pressure 125/73, pulse 64, temperature 99.2 F (37.3 C), temperature source Oral, resp. rate (!) 32, height 5\' 10"  (1.778 m), weight (!) 176.9 kg, SpO2 97 %.    FiO2 (%):  [50 %] 50 %   Intake/Output Summary (Last 24 hours) at 11/27/2019 1425 Last data filed at 11/26/2019 1900 Gross per 24 hour  Intake 50 ml  Output --  Net 50 ml   Filed Weights   11/22/19 1456  Weight: (!) 176.9 kg    Examination: General: Morbidly obese male who is somewhat more interactive today HEENT: No JVD or lymphadenopathy is appreciated Neuro: Awake follows commands oriented.  No focal deficits.  Good strength in all four extremities. CV: Heart sounds are distant but regular PULM: Diminished in the bases GI: soft, bsx4 active, obese GU: Foley with amber urine Extremities: warm/dry, 12+ edema  Skin: no rashes or lesions   Resolved Hospital Problem list   NA  Assessment & Plan:  Acute on chronic hypoxic respiratory failure in the setting of obstructive sleep apnea with noncompliance with CPAP, further complicated by anxiety and suspected volume overload. -Tolerating BiPAP. -May take several days for mentation to improve.  Was critically ill due to hypotension/shock in the setting of elevated procalcitonin elevated WBC unknown source but 90s been on immunosuppressant with prednisone just completed Dosepak for back pain per orthopedics. Blood cultures positive for strep Improving leukocytosis. Currently off vasopressors Currently on Rocephin blood cultures positive for strep TEE/MRI C-spine to rule out metastatic infections to determine duration  of therapy and need for further intervention. Rule out PM wire infection.   Best practice:  Diet: Carb modified Pain/Anxiety/Delirium protocol (if indicated): Haldol prn VAP protocol (if indicated): N/A DVT prophylaxis: SCDs/Lovenox GI prophylaxis:NA Glucose control: Sliding scale insulin for  type 2 diabetes with good control. Mobility:bedrest  Code Status: full Family Communication: Wife updated at bedside today. Disposition: ICU.  Keep in ICU until TEE MRI complete as may require sedation with risk of airway compromise.  Labs   CBC: Recent Labs  Lab 11/22/19 1507 11/22/19 1507 11/23/19 0420 11/23/19 7371 11/24/19 0121 11/25/19 0415 11/25/19 1741 11/26/19 0344 11/27/19 0347  WBC 25.9*   < > 35.6*  --  34.9* 33.1*  --  18.2* 15.6*  NEUTROABS 25.4*  --   --   --  28.6* 25.8*  --  13.1* 11.6*  HGB 11.3*   < > 11.3*   < > 11.7* 11.7* 12.2* 11.2* 11.4*  HCT 34.0*   < > 34.0*   < > 34.8* 35.0* 36.0* 33.9* 35.9*  MCV 88.1   < > 88.5  --  87.9 89.5  --  90.6 93.0  PLT 165   < > 178  --  148* 153  --  186 188   < > = values in this interval not displayed.    Basic Metabolic Panel: Recent Labs  Lab 11/23/19 0420 11/23/19 0814 11/24/19 0121 11/25/19 0415 11/25/19 1741 11/26/19 0344 11/27/19 0347  NA 136   < > 140 142 145 148* 143  K 3.5   < > 3.6 3.5 3.5 3.5 3.8  CL 100  --  102 100  --  100 99  CO2 26  --  29 31  --  37* 34*  GLUCOSE 179*  --  184* 173*  --  168* 148*  BUN 102*  --  74* 39*  --  34* 27*  CREATININE 2.49*  --  1.54* 0.98  --  1.02 0.85  CALCIUM 8.8*  --  8.9 9.4  --  9.5 9.2  MG 2.1  --  2.1 1.8  --  1.7  --   PHOS 4.0  --  3.5 2.8  --  2.6  --    < > = values in this interval not displayed.   GFR: Estimated Creatinine Clearance: 129.2 mL/min (by C-G formula based on SCr of 0.85 mg/dL). Recent Labs  Lab 11/22/19 1544 11/22/19 1700 11/22/19 1954 11/23/19 0420 11/23/19 0855 11/24/19 0121 11/25/19 0415 11/26/19 0344 11/27/19 0347  PROCALCITON  --   --  34.99  --   --   --   --   --   --   WBC  --   --   --    < >  --  34.9* 33.1* 18.2* 15.6*  LATICACIDVEN 2.8* 2.5*  --   --  1.6  --   --   --   --    < > = values in this interval not displayed.    Liver Function Tests: Recent Labs  Lab 11/22/19 1545 11/25/19 0415 11/26/19 0344  11/27/19 0347  AST 42* 38 30 28  ALT 95* 57* 45* 40  ALKPHOS 299* 246* 207* 218*  BILITOT 1.5* 1.2 1.1 1.0  PROT 5.7* 5.7* 5.9* 6.1*  ALBUMIN 1.9* 1.7* 1.7* 1.8*   Recent Labs  Lab 11/22/19 1545  LIPASE 50   No results for input(s): AMMONIA in the last 168 hours.  ABG    Component Value Date/Time  PHART 7.474 (H) 11/25/2019 1741   PCO2ART 56.4 (H) 11/25/2019 1741   PO2ART 59 (L) 11/25/2019 1741   HCO3 41.4 (H) 11/25/2019 1741   TCO2 43 (H) 11/25/2019 1741   O2SAT 91.0 11/25/2019 1741     Coagulation Profile: Recent Labs  Lab 11/22/19 1545  INR 1.1    Cardiac Enzymes: No results for input(s): CKTOTAL, CKMB, CKMBINDEX, TROPONINI in the last 168 hours.  HbA1C: Hgb A1c MFr Bld  Date/Time Value Ref Range Status  11/22/2019 07:55 PM 7.1 (H) 4.8 - 5.6 % Final    Comment:    (NOTE) Pre diabetes:          5.7%-6.4%  Diabetes:              >6.4%  Glycemic control for   <7.0% adults with diabetes     CBG: Recent Labs  Lab 11/26/19 1123 11/26/19 1637 11/26/19 2215 11/27/19 0752 11/27/19 1108  GLUCAP 214* 162* 136* 137* 178*    Lynnell Catalan, MD [facility] ICU Physician [facility]  Pager: [phone] Mobile: [phone] After hours: [phone].  11/27/2019, 2:25 PM

## 2019-11-27 NOTE — Progress Notes (Signed)
    [facility] has been requested to perform a transesophageal echocardiogram on 11/28/19 for bacteremia.  After careful review of history and examination, the risks and benefits of transesophageal echocardiogram have been explained including risks of esophageal damage, perforation (1:10,000 risk), bleeding, pharyngeal hematoma as well as other potential complications associated with conscious sedation including aspiration, arrhythmia, respiratory failure and death. Alternatives to treatment were discussed, questions were answered. Patient is willing to proceed.   Anticipate TEE 11/28/19 with Dr. Cristal Deer.  Judy Pimple, PA-C 11/27/2019 10:42 AM

## 2019-11-28 ENCOUNTER — Inpatient Hospital Stay (HOSPITAL_COMMUNITY): Payer: Medicare Other

## 2019-11-28 DIAGNOSIS — I340 Nonrheumatic mitral (valve) insufficiency: Secondary | ICD-10-CM

## 2019-11-28 DIAGNOSIS — R7881 Bacteremia: Secondary | ICD-10-CM

## 2019-11-28 LAB — CBC WITH DIFFERENTIAL/PLATELET
Abs Immature Granulocytes: 0.67 10*3/uL — ABNORMAL HIGH (ref 0.00–0.07)
Basophils Absolute: 0 10*3/uL (ref 0.0–0.1)
Basophils Relative: 0 %
Eosinophils Absolute: 0.2 10*3/uL (ref 0.0–0.5)
Eosinophils Relative: 1 %
HCT: 34 % — ABNORMAL LOW (ref 39.0–52.0)
Hemoglobin: 10.5 g/dL — ABNORMAL LOW (ref 13.0–17.0)
Immature Granulocytes: 4 %
Lymphocytes Relative: 11 %
Lymphs Abs: 1.7 10*3/uL (ref 0.7–4.0)
MCH: 28.8 pg (ref 26.0–34.0)
MCHC: 30.9 g/dL (ref 30.0–36.0)
MCV: 93.4 fL (ref 80.0–100.0)
Monocytes Absolute: 0.6 10*3/uL (ref 0.1–1.0)
Monocytes Relative: 4 %
Neutro Abs: 12.7 10*3/uL — ABNORMAL HIGH (ref 1.7–7.7)
Neutrophils Relative %: 80 %
Platelets: 211 10*3/uL (ref 150–400)
RBC: 3.64 MIL/uL — ABNORMAL LOW (ref 4.22–5.81)
RDW: 14.9 % (ref 11.5–15.5)
WBC: 15.9 10*3/uL — ABNORMAL HIGH (ref 4.0–10.5)
nRBC: 0 % (ref 0.0–0.2)

## 2019-11-28 LAB — GLUCOSE, CAPILLARY
Glucose-Capillary: 127 mg/dL — ABNORMAL HIGH (ref 70–99)
Glucose-Capillary: 132 mg/dL — ABNORMAL HIGH (ref 70–99)
Glucose-Capillary: 124 mg/dL — ABNORMAL HIGH (ref 70–99)

## 2019-11-28 MED ORDER — FENTANYL CITRATE (PF) 100 MCG/2ML IJ SOLN: 100.0000 ug | Freq: Once | INTRAMUSCULAR | Status: DC

## 2019-11-28 MED ORDER — LIDOCAINE VISCOUS HCL 2 % MT SOLN
15.0000 mL | Freq: Once | OROMUCOSAL | Status: AC
  Administered 2019-11-28: 15 mL via OROMUCOSAL
  Filled 2019-11-28: qty 15

## 2019-11-28 MED ORDER — MIDAZOLAM HCL 2 MG/2ML IJ SOLN
INTRAMUSCULAR | Status: AC
  Administered 2019-11-28: 3 mg
  Filled 2019-11-28: qty 4

## 2019-11-28 MED ORDER — MIDAZOLAM HCL 2 MG/2ML IJ SOLN: 4.0000 mg | Freq: Once | INTRAMUSCULAR | Status: DC

## 2019-11-28 MED ORDER — FENTANYL CITRATE (PF) 100 MCG/2ML IJ SOLN
INTRAMUSCULAR | Status: AC
  Administered 2019-11-28: 75 ug
  Filled 2019-11-28: qty 2

## 2019-11-28 NOTE — Progress Notes (Signed)
Physical Therapy Treatment Patient Details Name: Kevin [name]. MRN: [id number] DOB: [birth year] Today's Date: 11/28/2019    History of Present Illness 69 year old male brought to the emergency room for evaluation of a 1 to 1-1/2-week history of worsening lethargy malaise decreased p.o. intake. 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 Pt found to have acute encephalopathy in setting of hypercarbia, sepsis    PT Comments    Patient progressing this session with mobility able to take steps forward and back, but limited with dizziness and limitations of O2 tubing on HFNC.  He still could not recall name of this hospital, but knew he was in hospital in [place].  Feel he is appropriate now for CIR for intensive rehab to allow return home with wife assist.    Follow Up Recommendations  CIR     Equipment Recommendations  Rolling walker with 5" wheels;3in1 (PT)    Recommendations for Other Services       Precautions / Restrictions Precautions Precautions: Fall Precaution Comments: monitor SpO2 (on 6 liters HFNC) Restrictions Weight Bearing Restrictions: No    Mobility  Bed Mobility Overal bed mobility: Needs Assistance Bed Mobility: Supine to Sit Rolling: Min assist   Supine to sit: Mod assist     General bed mobility comments: Needed A for trunk--could initiate but not follow through  Transfers Overall transfer level: Needs assistance Equipment used: Rolling walker (2 wheeled) Transfers: Sit to/from [employer] Sit to Stand: Min assist;+2 physical assistance Stand pivot transfers: Min assist;+2 physical assistance          Ambulation/Gait Ambulation/Gait assistance: Min assist;+2 safety/equipment Gait Distance (Feet): 4 Feet Assistive device: Rolling walker (2 wheeled)       General Gait Details: forward and back with RW x 4' limited by dizziness and on  HFNC   Stairs             Wheelchair Mobility    Modified Rankin (Stroke Patients Only)       Balance Overall balance assessment: Needs assistance Sitting-balance support: No upper extremity supported;Feet supported Sitting balance-Leahy Scale: Good     Standing balance support: Bilateral upper extremity supported;During functional activity Standing balance-Leahy Scale: Poor Standing balance comment: reliant on UE support on RW and additional support from therapists                            Cognition Arousal/Alertness: Awake/alert Behavior During Therapy: WFL for tasks assessed/performed Overall Cognitive Status: Impaired/Different from baseline Area of Impairment: Orientation;Attention;Safety/judgement;Awareness;Problem solving                 Orientation Level: Place;Time [facility], July) Current Attention Level: Sustained     Safety/Judgement: Decreased awareness of safety;Decreased awareness of deficits Awareness: Intellectual Problem Solving: Requires verbal cues;Requires tactile cues        Exercises      General Comments General comments (skin integrity, edema, etc.): 6L HFNC with SpO2 WNL, BP drop 15 mmHg seated after standing with c/o dizziness      Pertinent Vitals/Pain Pain Assessment: Faces Faces Pain Scale: Hurts whole lot Pain Location: neck--wife reports he had a neck strain about 2 weeks ago (sat/leaned in one position too long) Pain Descriptors / Indicators: Guarding Pain Intervention(s): Limited activity within patient's tolerance;Other (comment) (OT applied Voltaren gel on neck per RN)    Home Living Family/patient expects to be discharged to:: Private residence  Living Arrangements: Spouse/significant other Available Help at Discharge: Family;Available 24 hours/day Type of Home: House Home Access: Stairs to enter Entrance Stairs-Rails: None Home Layout: Two level Home Equipment: Environmental consultant - 2 wheels       Prior Function Level of Independence: Independent with assistive device(s)          PT Goals (current goals can now be found in the care plan section) Acute Rehab PT Goals Patient Stated Goal: to be free of pain and be able to walk Progress towards PT goals: Progressing toward goals    Frequency    Min 3X/week      PT Plan Discharge plan needs to be updated    Co-evaluation PT/OT/SLP Co-Evaluation/Treatment: Yes Reason for Co-Treatment: For patient/therapist safety;To address functional/ADL transfers PT goals addressed during session: Mobility/safety with mobility;Balance;Proper use of DME OT goals addressed during session: Strengthening/ROM;ADL's and self-care      AM-PAC PT "6 Clicks" Mobility   Outcome Measure  Help needed turning from your back to your side while in a flat bed without using bedrails?: A Little Help needed moving from lying on your back to sitting on the side of a flat bed without using bedrails?: A Lot Help needed moving to and from a bed to a chair (including a wheelchair)?: A Lot Help needed standing up from a chair using your arms (e.g., wheelchair or bedside chair)?: A Lot Help needed to walk in hospital room?: A Lot Help needed climbing 3-5 steps with a railing? : Total 6 Click Score: 12    End of Session Equipment Utilized During Treatment: Oxygen Activity Tolerance: Patient limited by fatigue Patient left: in chair;with call bell/phone within reach   PT Visit Diagnosis: Unsteadiness on feet (R26.81);Other abnormalities of gait and mobility (R26.89);Muscle weakness (generalized) (M62.81)     Time: 8937-3428 PT Time Calculation (min) (ACUTE ONLY): 28 min  Charges:  $Therapeutic Activity: 8-22 mins                     Sheran Lawless, PT Acute Rehabilitation Services Pager:[phone] Office:[phone] 11/28/2019    Kevin [name] 11/28/2019, 12:54 PM

## 2019-11-28 NOTE — Progress Notes (Signed)
Spoke with imaging, MRI most likely won't be done until after 5pm.

## 2019-11-28 NOTE — Progress Notes (Signed)
NAME:  Kevin [name]., MRN:  [id number], DOB:  04/20/1950, LOS: 6 ADMISSION DATE:  11/22/2019, CONSULTATION DATE: 11/22/2019 REFERRING MD:  Redge Gainer, ED  CHIEF COMPLAINT: Lethargy and malaise  Brief History   Patient is a 69 year old male brought to the emergency room for evaluation of a 1 to 1-1/2-week history of worsening lethargy malaise decreased p.o. intake.  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 intake, vague abdominal and chest complaints of pain, diarrhea, and decreased appetite.  In the emergency room he was noted to be hypotensive.  At this point he is receiving a liter of IV fluid and is on 9 mics of Levophed.  Systolic is 100 diastolic is 80 at this point.  He is awake alert although somewhat lethargic.  He really cannot give me any specific complaints other than that mentioned above in regards to discomfort or pain. Laboratories are pertinent for glucose of 174 with a known history of type 2 diabetes mellitus, lactic acid is 2.8, troponin is 41.  Urinalysis is fairly unremarkable.  Creatinine is 3.27 with a BUN of 118.  White count is 25 hemoglobin is 11 platelet count 165.  Past Medical History   . Diabetes mellitus without complication (HCC)   . Hypertension   Untreated sleep apnea Morbid obesity Permanent pacemaker   Significant Hospital Events   Admission to [facility], ICU 11/22/2019  Consults:  PCCM  Procedures:  NA  Significant Diagnostic Tests:  As above  Micro Data:  Blood cultures planted, urinalysis unremarkable, UA pending. 825 urine culture multiple species 2 blood cultures on 8/25+ for strep vancomycin DC'd placed on Rocephin Antimicrobials:  Currently on Rocephin  Interim history/subjective:  Tolerating nocturnal BiPAP  C/o neck pain when boosted in bed C/o  NPO status   Objective   Blood pressure 135/78, pulse 77, temperature 98.8 F (37.1 C), temperature source Axillary, resp. rate (!) 23, height 5\' 10"  (1.778 m), weight 131.2 kg, SpO2 94 %.    FiO2 (%):  [50 %] 50 %   Intake/Output Summary (Last 24 hours) at 11/28/2019 0900 Last data filed at 11/28/2019 0500 Gross per 24 hour  Intake 300 ml  Output 900 ml  Net -600 ml   Filed Weights   11/22/19 1456 11/28/19 0500  Weight: (!) 176.9 kg 131.2 kg    Examination: General: Obese older adult M reclined in bed NAD  HEENT: NCAT patent nares with [place] in place. Pink tacky mm Trachea midline. Non-focal neck pain with general movement, non-tender to palpation.  Neuro: AAO x4 following commands.  CV: RRR s1s2 cap refill < 3 seconds  PULM: CTA with diminished bibasilar sounds  GI: obese soft round ndnt GU: Amber urine  Extremities: No obvious joint deformity BUE BLE 5/5  Skin: c/d/w without rash    Resolved Hospital Problem list   NA  Assessment & Plan:   Acute encephalopathy in setting of hypercarbia, sepsis Improving mentation with Bipap P -minimize sedating agents   -sepsis as below   Acute on chronic hypoxic respiratory failure in the setting of obstructive sleep apnea with noncompliance with CPAP, -c/b anxiety  P -qHS BiPAP  -Supplemenatal O2 for SpO2 > 90% -BDs  Septic shock, improved Sepsis due to strep intermedius bacteremia -pt previously on steroids -has PM and has been complaining of neck pain PTA  P Continue rocephin TEE with cardiology, likely 8/30-- r/p pacemaker wire infection  NPO for TEE  Also need to obtain MRI C Spin to r/o infection Trend WBC, fever curve  Dm2 P  SSI   Best practice:  Diet: NPO Pain/Anxiety/Delirium protocol (if indicated): Haldol prn VAP protocol (if indicated): N/A DVT prophylaxis: Sub Q Heparin GI prophylaxis:NA Glucose control: SSI  Mobility:bedrest  Code Status: full Family Communication: Patient updated at bedside  8/30 Disposition: ICU.  Keep in ICU until TEE MRI complete as may require sedation with risk of airway compromise.  Labs   CBC: Recent Labs  Lab 11/24/19 0121 11/24/19 0121 11/25/19 0415 11/25/19 1741 11/26/19 0344 11/27/19 0347 11/28/19 0715  WBC 34.9*  --  33.1*  --  18.2* 15.6* 15.9*  NEUTROABS 28.6*  --  25.8*  --  13.1* 11.6* 12.7*  HGB 11.7*   < > 11.7* 12.2* 11.2* 11.4* 10.5*  HCT 34.8*   < > 35.0* 36.0* 33.9* 35.9* 34.0*  MCV 87.9  --  89.5  --  90.6 93.0 93.4  PLT 148*  --  153  --  186 188 211   < > = values in this interval not displayed.    Basic Metabolic Panel: Recent Labs  Lab 11/23/19 0420 11/23/19 0814 11/24/19 0121 11/25/19 0415 11/25/19 1741 11/26/19 0344 11/27/19 0347  NA 136   < > 140 142 145 148* 143  K 3.5   < > 3.6 3.5 3.5 3.5 3.8  CL 100  --  102 100  --  100 99  CO2 26  --  29 31  --  37* 34*  GLUCOSE 179*  --  184* 173*  --  168* 148*  BUN 102*  --  74* 39*  --  34* 27*  CREATININE 2.49*  --  1.54* 0.98  --  1.02 0.85  CALCIUM 8.8*  --  8.9 9.4  --  9.5 9.2  MG 2.1  --  2.1 1.8  --  1.7  --   PHOS 4.0  --  3.5 2.8  --  2.6  --    < > = values in this interval not displayed.   GFR: Estimated Creatinine Clearance: 108.6 mL/min (by C-G formula based on SCr of 0.85 mg/dL). Recent Labs  Lab 11/22/19 1544 11/22/19 1700 11/22/19 1954 11/23/19 0420 11/23/19 0855 11/24/19 0121 11/25/19 0415 11/26/19 0344 11/27/19 0347 11/28/19 0715  PROCALCITON  --   --  34.99  --   --   --   --   --   --   --   WBC  --   --   --    < >  --    < > 33.1* 18.2* 15.6* 15.9*  LATICACIDVEN 2.8* 2.5*  --   --  1.6  --   --   --   --   --    < > = values in this interval not displayed.    Liver Function Tests: Recent Labs  Lab 11/22/19 1545 11/25/19 0415 11/26/19 0344 11/27/19 0347  AST 42* 38 30 28  ALT 95* 57* 45* 40  ALKPHOS 299* 246* 207* 218*  BILITOT 1.5* 1.2 1.1 1.0  PROT 5.7* 5.7* 5.9* 6.1*  ALBUMIN 1.9* 1.7* 1.7* 1.8*   Recent Labs   Lab 11/22/19 1545  LIPASE 50   No results for input(s): AMMONIA in the last 168 hours.  ABG    Component Value Date/Time   PHART 7.474 (H) 11/25/2019  1741   PCO2ART 56.4 (H) 11/25/2019 1741   PO2ART 59 (L) 11/25/2019 1741   HCO3 41.4 (H) 11/25/2019 1741   TCO2 43 (H) 11/25/2019 1741   O2SAT 91.0 11/25/2019 1741     Coagulation Profile: Recent Labs  Lab 11/22/19 1545  INR 1.1    Cardiac Enzymes: No results for input(s): CKTOTAL, CKMB, CKMBINDEX, TROPONINI in the last 168 hours.  HbA1C: Hgb A1c MFr Bld  Date/Time Value Ref Range Status  11/22/2019 07:55 PM 7.1 (H) 4.8 - 5.6 % Final    Comment:    (NOTE) Pre diabetes:          5.7%-6.4%  Diabetes:              >6.4%  Glycemic control for   <7.0% adults with diabetes     CBG: Recent Labs  Lab 11/27/19 0752 11/27/19 1108 11/27/19 1637 11/27/19 2206 11/28/19 0741  GLUCAP 137* 178* 138* 135* 127*      Tessie Fass MSN, AGACNP-BC [facility] [phone] If no answer, [phone] 11/28/2019, 9:39 AM

## 2019-11-28 NOTE — Procedures (Signed)
    TRANSESOPHAGEAL ECHOCARDIOGRAM   NAME:  Kevin L Parmenter Jr.    MRN: [id number] DOB:  [birth year]    ADMIT DATE: 11/22/2019  INDICATIONS: Bacteremia   PROCEDURE:   Informed consent was obtained prior to the procedure. The risks, benefits and alternatives for the procedure were discussed and the patient comprehended these risks.  Risks include, but are not limited to, cough, sore throat, vomiting, nausea, somnolence, esophageal and stomach trauma or perforation, bleeding, low blood pressure, aspiration, pneumonia, infection, trauma to the teeth and death.    Procedural time out performed. The oropharynx was anesthetized with topical lidocaine.     Anesthesia was administered by Dr. Denese Killings.  The patient was administered a total of Versed 3 mg and Fentanyl 75 mcg to achieve and maintain moderate conscious sedation.  The patient's heart rate, blood pressure, and oxygen saturation are monitored continuously during the procedure. The period of conscious sedation is 20 minutes, of which I was present face-to-face 100% of this time.   The transesophageal probe was inserted in the esophagus and stomach without difficulty and multiple views were obtained.   COMPLICATIONS:    There were no immediate complications.  KEY FINDINGS:  1. Normal LVEF, 60-65% without WMA. 2. Normal RV size/function.  3. No evidence of infective endocarditis on the native valves or pacemaker lead.  4. No LAA thrombus.  5. Full report to follow. 6. Further management per primary team.   Kevin [name]. Flora Lipps, MD [facility]  [address], [address] [place], [place] [address] [phone]  11:35 AM

## 2019-11-28 NOTE — Evaluation (Signed)
Occupational Therapy Evaluation Patient Details Name: Kevin [name]. MRN: [id number] DOB: 1950/10/22 Today's Date: 11/28/2019    History of Present Illness 69 year old male brought to the emergency room for evaluation of a 1 to 1-1/2-week history of worsening lethargy malaise decreased p.o. intake. 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 Pt found to have acute encephalopathy in setting of hypercarbia, sepsis   Clinical Impression   This 69 yo male admitted with above presents to acute OT with PLOF of being totally independent with all basic ADLs. He currently is setup/S-total A for basic ADLs, Mod A for OOB, min A +2 sit<>stand, stand pivot, and walk short distances. He will benefit from continued acute OT with follow on CIR recommended.    Follow Up Recommendations  CIR;Supervision/Assistance - 24 hour    Equipment Recommendations  Other (comment) (TBD next venue)       Precautions / Restrictions Precautions Precautions: Fall Precaution Comments: monitor SpO2 (on 6 liters HFNC) Restrictions Weight Bearing Restrictions: No      Mobility Bed Mobility Overal bed mobility: Needs Assistance Bed Mobility: Supine to Sit     Supine to sit: Mod assist     General bed mobility comments: Needed A for trunk--could initiate but not follow through  Transfers Overall transfer level: Needs assistance Equipment used: Rolling walker (2 wheeled) Transfers: Sit to/from [employer] Sit to Stand: Min assist;+2 physical assistance Stand pivot transfers: Min assist;+2 physical assistance            Balance Overall balance assessment: Needs assistance Sitting-balance support: No upper extremity supported;Feet supported Sitting balance-Leahy Scale: Good     Standing balance support: Bilateral upper extremity supported;During functional activity Standing balance-Leahy Scale:  Poor Standing balance comment: reliant on UE support on RW and additional support from therapists                           ADL either performed or assessed with clinical judgement   ADL Overall ADL's : Needs assistance/impaired Eating/Feeding: Set up;Sitting   Grooming: Set up;Sitting   Upper Body Bathing: Minimal assistance;Sitting   Lower Body Bathing: Total assistance Lower Body Bathing Details (indicate cue type and reason): Min A +2 sit<>stand Upper Body Dressing : Moderate assistance;Sitting   Lower Body Dressing: Total assistance Lower Body Dressing Details (indicate cue type and reason): Min A +2 sit<>stand Toilet Transfer: Minimal assistance;+2 for physical assistance;+2 for safety/equipment;RW Toilet Transfer Details (indicate cue type and reason): bed>recliner Toileting- Clothing Manipulation and Hygiene: Total assistance Toileting - Clothing Manipulation Details (indicate cue type and reason): Min A +2 sit<>stand             Vision Baseline Vision/History: Wears glasses Wears Glasses: Reading only Patient Visual Report: No change from baseline              Pertinent Vitals/Pain Pain Assessment: Faces Faces Pain Scale: Hurts whole lot Pain Location: neck--wife reports he had a neck strain about 2 weeks ago (sat/leaned in one position too long) Pain Descriptors / Indicators: Guarding Pain Intervention(s): Limited activity within patient's tolerance;Other (comment) (applied cream on patient's neck he had in room after checking with RN)     Hand Dominance Right   Extremity/Trunk Assessment Upper Extremity Assessment Upper Extremity Assessment: Generalized weakness           Communication Communication Communication: No difficulties   Cognition Arousal/Alertness: Awake/alert Behavior During Therapy:  WFL for tasks assessed/performed Overall Cognitive Status: Impaired/Different from baseline Area of Impairment:  Orientation;Attention;Safety/judgement;Awareness;Problem solving                 Orientation Level: Place;Time [facility], July) Current Attention Level: Sustained     Safety/Judgement: Decreased awareness of safety;Decreased awareness of deficits Awareness: Intellectual Problem Solving: Requires verbal cues;Requires tactile cues                Home Living Family/patient expects to be discharged to:: Private residence Living Arrangements: Spouse/significant other Available Help at Discharge: Family;Available 24 hours/day Type of Home: House Home Access: Stairs to enter [employer] of Steps: 1 Entrance Stairs-Rails: None Home Layout: Two level     Bathroom Shower/Tub: Walk-in Pensions consultant: Standard     Home Equipment: Environmental consultant - 2 wheels          Prior Functioning/Environment Level of Independence: Independent with assistive device(s)                 OT Problem List: Impaired balance (sitting and/or standing);Decreased cognition;Decreased safety awareness;Pain      OT Treatment/Interventions: Self-care/ADL training;DME and/or AE instruction;Patient/family education;Balance training    OT Goals(Current goals can be found in the care plan section) Acute Rehab OT Goals Patient Stated Goal: to be free of pain and be able to walk OT Goal Formulation: With patient/family Time For Goal Achievement: 12/12/19 Potential to Achieve Goals: Good  OT Frequency: Min 2X/week           Co-evaluation PT/OT/SLP Co-Evaluation/Treatment: Yes Reason for Co-Treatment: For patient/therapist safety PT goals addressed during session: Mobility/safety with mobility;Balance;Proper use of DME;Strengthening/ROM OT goals addressed during session: Strengthening/ROM;ADL's and self-care      AM-PAC OT "6 Clicks" Daily Activity     Outcome Measure Help from another person eating meals?: A Little Help from another person taking care of  personal grooming?: A Little Help from another person toileting, which includes using toliet, bedpan, or urinal?: A Lot Help from another person bathing (including washing, rinsing, drying)?: A Lot Help from another person to put on and taking off regular upper body clothing?: A Lot Help from another person to put on and taking off regular lower body clothing?: Total 6 Click Score: 13   End of Session Equipment Utilized During Treatment: Gait belt;Rolling walker Nurse Communication: Mobility status  Activity Tolerance: Patient tolerated treatment well Patient left: in chair;with call bell/phone within reach;with chair alarm set;with family/visitor present (wife)  OT Visit Diagnosis: Unsteadiness on feet (R26.81);Other abnormalities of gait and mobility (R26.89);Muscle weakness (generalized) (M62.81);Pain;Other symptoms and signs involving cognitive function Pain - part of body:  (neck)                Time: 5361-4431 OT Time Calculation (min): 28 min Charges:  OT General Charges $OT Visit: 1 Visit OT Evaluation $OT Eval Moderate Complexity: 1 Mod  Ignacia Palma, OTR/L Acute [employer] Pager [phone] Office [phone]     Evette Georges 11/28/2019, 11:15 AM

## 2019-11-28 NOTE — Progress Notes (Signed)
  Echocardiogram Echocardiogram Transesophageal has been performed at bedside.  Gerda Diss 11/28/2019, 12:08 PM

## 2019-11-29 ENCOUNTER — Inpatient Hospital Stay (HOSPITAL_COMMUNITY): Payer: Medicare Other

## 2019-11-29 ENCOUNTER — Encounter (HOSPITAL_COMMUNITY)
Admission: EM | Disposition: A | Discharge status: Skilled Nursing Facility | Payer: Self-pay | Source: Home / Self Care | Attending: Internal Medicine

## 2019-11-29 ENCOUNTER — Inpatient Hospital Stay: Payer: Self-pay

## 2019-11-29 ENCOUNTER — Encounter (HOSPITAL_COMMUNITY): Payer: Self-pay | Admitting: Pulmonary Disease

## 2019-11-29 DIAGNOSIS — B9689 Other specified bacterial agents as the cause of diseases classified elsewhere: Secondary | ICD-10-CM

## 2019-11-29 LAB — BASIC METABOLIC PANEL
Anion gap: 10 (ref 5–15)
BUN: 19 mg/dL (ref 8–23)
CO2: 31 mmol/L (ref 22–32)
Calcium: 8.9 mg/dL (ref 8.9–10.3)
Chloride: 97 mmol/L — ABNORMAL LOW (ref 98–111)
Creatinine, Ser: 0.84 mg/dL (ref 0.61–1.24)
GFR calc Af Amer: >60 mL/min (ref >60)
GFR calc non Af Amer: >60 mL/min (ref >60)
Glucose, Bld: 142 mg/dL — ABNORMAL HIGH (ref 70–99)
Potassium: 4 mmol/L (ref 3.5–5.1)
Sodium: 138 mmol/L (ref 135–145)

## 2019-11-29 LAB — CBC WITH DIFFERENTIAL/PLATELET
Abs Immature Granulocytes: 0.23 10*3/uL — ABNORMAL HIGH (ref 0.00–0.07)
Basophils Absolute: 0 10*3/uL (ref 0.0–0.1)
Basophils Relative: 0 %
Eosinophils Absolute: 0.2 10*3/uL (ref 0.0–0.5)
Eosinophils Relative: 1 %
HCT: 32.8 % — ABNORMAL LOW (ref 39.0–52.0)
Hemoglobin: 10.4 g/dL — ABNORMAL LOW (ref 13.0–17.0)
Immature Granulocytes: 2 %
Lymphocytes Relative: 14 %
Lymphs Abs: 1.6 10*3/uL (ref 0.7–4.0)
MCH: 30 pg (ref 26.0–34.0)
MCHC: 31.7 g/dL (ref 30.0–36.0)
MCV: 94.5 fL (ref 80.0–100.0)
Monocytes Absolute: 0.5 10*3/uL (ref 0.1–1.0)
Monocytes Relative: 4 %
Neutro Abs: 9 10*3/uL — ABNORMAL HIGH (ref 1.7–7.7)
Neutrophils Relative %: 79 %
Platelets: 253 10*3/uL (ref 150–400)
RBC: 3.47 MIL/uL — ABNORMAL LOW (ref 4.22–5.81)
RDW: 15 % (ref 11.5–15.5)
WBC: 11.4 10*3/uL — ABNORMAL HIGH (ref 4.0–10.5)
nRBC: 0 % (ref 0.0–0.2)

## 2019-11-29 LAB — GLUCOSE, CAPILLARY
Glucose-Capillary: 168 mg/dL — ABNORMAL HIGH (ref 70–99)
Glucose-Capillary: 151 mg/dL — ABNORMAL HIGH (ref 70–99)
Glucose-Capillary: 136 mg/dL — ABNORMAL HIGH (ref 70–99)
Glucose-Capillary: 157 mg/dL — ABNORMAL HIGH (ref 70–99)
Glucose-Capillary: 175 mg/dL — ABNORMAL HIGH (ref 70–99)
Glucose-Capillary: 139 mg/dL — ABNORMAL HIGH (ref 70–99)

## 2019-11-29 LAB — CULTURE, BLOOD (ROUTINE X 2)
Culture: NO GROWTH
Culture: NO GROWTH
Special Requests: ADEQUATE
Special Requests: ADEQUATE

## 2019-11-29 SURGERY — ECHOCARDIOGRAM, TRANSESOPHAGEAL
Anesthesia: Monitor Anesthesia Care

## 2019-11-29 MED ORDER — SODIUM CHLORIDE 0.9% FLUSH
10.0000 mL | Freq: Two times a day (BID) | INTRAVENOUS | Status: DC
  Administered 2019-11-30: 20 mL
  Administered 2019-11-30 – 2019-12-02 (×3): 10 mL
  Administered 2019-12-02: 20 mL
  Administered 2019-12-03: 10 mL
  Administered 2019-12-04: 40 mL
  Administered 2019-12-04 – 2019-12-09 (×8): 10 mL
  Administered 2019-12-10 – 2019-12-11 (×3): 20 mL
  Administered 2019-12-11 – 2019-12-12 (×2): 10 mL
  Administered 2019-12-12: 20 mL
  Administered 2019-12-13: 10 mL

## 2019-11-29 MED ORDER — GADOBUTROL 1 MMOL/ML IV SOLN
10.0000 mL | Freq: Once | INTRAVENOUS | Status: AC | PRN
  Administered 2019-11-29: 10 mL via INTRAVENOUS

## 2019-11-29 MED ORDER — HYDROXYZINE HCL 25 MG PO TABS
25.0000 mg | ORAL_TABLET | Freq: Once | ORAL | Status: DC
  Filled 2019-11-29: qty 1

## 2019-11-29 MED ORDER — FUROSEMIDE 10 MG/ML IJ SOLN
40.0000 mg | Freq: Every day | INTRAMUSCULAR | Status: DC
  Administered 2019-11-29 – 2019-12-01 (×3): 40 mg via INTRAVENOUS
  Filled 2019-11-29 (×3): qty 4

## 2019-11-29 MED ORDER — PENICILLIN G POTASSIUM 20000000 UNITS IJ SOLR
12.0000 10*6.[IU] | Freq: Two times a day (BID) | INTRAVENOUS | Status: DC
  Administered 2019-11-29 – 2019-12-09 (×20): 12 10*6.[IU] via INTRAVENOUS
  Filled 2019-11-29 (×20): qty 12
  Filled 2019-11-29: qty 5
  Filled 2019-11-29 (×6): qty 12

## 2019-11-29 MED ORDER — ALPRAZOLAM 0.5 MG PO TABS
0.5000 mg | ORAL_TABLET | Freq: Once | ORAL | Status: AC
  Administered 2019-11-29: 0.5 mg via ORAL
  Filled 2019-11-29: qty 1

## 2019-11-29 MED ORDER — SODIUM CHLORIDE 0.9% FLUSH
10.0000 mL | INTRAVENOUS | Status: DC | PRN
  Administered 2019-12-12: 20 mL

## 2019-11-29 NOTE — Consult Note (Addendum)
[facility]       Reason for Consult: epidural abscess    Referring Physician: Dr. Denese Killings  Active Problems:   Hypotension   Pressure injury of skin   . arformoterol  15 mcg Nebulization BID  . budesonide (PULMICORT) nebulizer solution  0.5 mg Nebulization BID  . Chlorhexidine Gluconate Cloth  6 each Topical Daily  . furosemide  40 mg Intravenous Daily  . heparin  5,000 Units Subcutaneous Q8H  . hydrOXYzine  25 mg Oral Once  . insulin aspart  0-20 Units Subcutaneous TID WC  . ipratropium-albuterol  3 mL Nebulization BID  . mouth rinse  15 mL Mouth Rinse BID    Recommendations: Penicillin IV Will need 8 weeks  PICC line - I have ordered  Assessment: He has Strep intermedius in blood cultures and an extensive epidural abscess in his cervical region and inflammatory changes c/w discitis, osteomyelitis.   Also with a history of a PM and TEE without any concerns for vegetation.  Penicillin allergy as a baby, tolerating cephalosporins.     Antibiotics: Day 8 total antibiotics Day 6 ceftriaxone  HPI: Kevin Gust. is a 69 y.o. male with DM, Mobitz type II AV block with PM, OSA, came in with multiple complaints including hypotension and sepsis.  He continues to have some encephalopathy that is improving and blood cultures c/w Strep intermedius, penicillin sensitive.  Initial leukocytosis  Of 35.6, fever 100.4.  Was told by his mother he had an allergic reaction to penicillin as a baby.   Review of Systems:  Constitutional: negative for fevers and chills Gastrointestinal: negative for nausea and diarrhea Integument/breast: negative for rash All other systems reviewed and are negative    Past Medical History:  Diagnosis Date  . Diabetes mellitus without complication (HCC)   . Hypertension   . Pacemaker     Social History   Tobacco Use  . Smoking status: Never Smoker  . Smokeless tobacco: Never Used  Substance Use Topics  .  Alcohol use: No  . Drug use: Not on file    [facility]: + heart disease  Allergies  Allergen Reactions  . Benazepril Swelling and Other (See Comments)    Lips only became swollen  . Ibuprofen Other (See Comments)    Was told by MD to not take this  . Mirabegron Other (See Comments) and Cough    Congestion and wheezing, also  . Amlodipine Swelling and Other (See Comments)    Leg edema   . Penicillins Other (See Comments)    Patient doesn't know reaction- was always told he was allergic   . Cephalexin Itching  . Molds & Smuts Other (See Comments)    "RUNNNING NOSE AND EYES"  . Oxycodone Itching  . Percodan [Oxycodone-Aspirin] Itching    Physical Exam: Constitutional: in no apparent distress and alert  Vitals:   11/29/19 1200 11/29/19 1300  BP: 119/65 116/73  Pulse: 78 77  Resp: (!) 33 (!) 30  Temp: 99.2 F (37.3 C)   SpO2: 92% 93%   EYES: anicteric Cardiovascular: Cor RRR Respiratory: clear; GI: Bowel sounds are normal, liver is not enlarged, spleen is not enlarged Musculoskeletal: no edema Skin: negatives: no rash Hematologic: no cervical lad  Lab Results  Component Value Date   WBC 11.4 (H) 11/29/2019   HGB 10.4 (L) 11/29/2019   HCT 32.8 (L) 11/29/2019   MCV 94.5 11/29/2019   PLT 253 11/29/2019    Lab Results  Component Value Date   CREATININE 0.84 11/29/2019   BUN 19 11/29/2019   NA 138 11/29/2019   K 4.0 11/29/2019   CL 97 (L) 11/29/2019   CO2 31 11/29/2019    Lab Results  Component Value Date   ALT 40 11/27/2019   AST 28 11/27/2019   ALKPHOS 218 (H) 11/27/2019     Microbiology: Recent Results (from the past 240 hour(s))  Blood culture (routine single)     Status: Abnormal   Collection Time: 11/22/19  3:07 PM   Specimen: BLOOD RIGHT HAND  Result Value Ref Range Status   Specimen Description BLOOD RIGHT HAND  Final   Special Requests   Final    BOTTLES DRAWN AEROBIC AND ANAEROBIC Blood Culture adequate volume   Culture  Setup Time   Final     GRAM POSITIVE COCCI IN BOTH AEROBIC AND ANAEROBIC BOTTLES CRITICAL VALUE NOTED.  VALUE IS CONSISTENT WITH PREVIOUSLY REPORTED AND CALLED VALUE.    Culture (A)  Final    STREPTOCOCCUS INTERMEDIUS SUSCEPTIBILITIES PERFORMED ON PREVIOUS CULTURE WITHIN THE LAST 5 DAYS. Performed at [facility], [address], [place], [place] [address]    Report Status 11/25/2019 FINAL  Final  SARS Coronavirus 2 by RT PCR (hospital order, performed in [facility] hospital lab) Nasopharyngeal Nasopharyngeal Swab     Status: None   Collection Time: 11/22/19  3:36 PM   Specimen: Nasopharyngeal Swab  Result Value Ref Range Status   SARS Coronavirus 2 NEGATIVE NEGATIVE Final    Comment: (NOTE) SARS-CoV-2 target nucleic acids are NOT DETECTED.  The SARS-CoV-2 RNA is generally detectable in upper and lower respiratory specimens during the acute phase of infection. The lowest concentration of SARS-CoV-2 viral copies this assay can detect is 250 copies / mL. A negative result does not preclude SARS-CoV-2 infection and should not be used as the sole basis for treatment or other patient management decisions.  A negative result may occur with improper specimen collection / handling, submission of specimen other than nasopharyngeal swab, presence of viral mutation(s) within the areas targeted by this assay, and inadequate number of viral copies (<250 copies / mL). A negative result must be combined with clinical observations, patient history, and epidemiological information.  Fact Sheet for Patients:   BoilerBrush.com.cy  Fact Sheet for Healthcare Providers: https://pope.com/  This test is not yet approved or  cleared by the [place] FDA and has been authorized for detection and/or diagnosis of SARS-CoV-2 by FDA under an Emergency Use Authorization (EUA).  This EUA will remain in effect (meaning this test can be used) for the duration of the COVID-19  declaration under Section 564(b)(1) of the Act, 21 U.S.C. section 360bbb-3(b)(1), unless the authorization is terminated or revoked sooner.  Performed at [facility], [address], [place], [place] [address]   Blood culture (routine x 2)     Status: Abnormal   Collection Time: 11/22/19  4:35 PM   Specimen: BLOOD  Result Value Ref Range Status   Specimen Description BLOOD RIGHT ANTECUBITAL  Final   Special Requests   Final    BOTTLES DRAWN AEROBIC AND ANAEROBIC Blood Culture results may not be optimal due to an inadequate volume of blood received in culture bottles   Culture  Setup Time   Final    GRAM POSITIVE COCCI ANAEROBIC BOTTLE ONLY Organism ID to follow CRITICAL RESULT CALLED TO, READ BACK BY AND VERIFIED WITH: Arnaldo Natal PharmD 16:30 11/23/19 ([email or web address]) Performed at [facility], [address], [place], [place] [address]    Culture STREPTOCOCCUS INTERMEDIUS (A)  Final   Report Status 11/25/2019 FINAL  Final   Organism ID, Bacteria STREPTOCOCCUS INTERMEDIUS  Final      Susceptibility   Streptococcus intermedius - MIC*    PENICILLIN <=0.06 SENSITIVE Sensitive     CEFTRIAXONE <=0.12 SENSITIVE Sensitive     ERYTHROMYCIN >=8 RESISTANT Resistant     LEVOFLOXACIN 0.5 SENSITIVE Sensitive     VANCOMYCIN 0.5 SENSITIVE Sensitive     * STREPTOCOCCUS INTERMEDIUS  Blood Culture ID Panel (Reflexed)     Status: Abnormal   Collection Time: 11/22/19  4:35 PM  Result Value Ref Range Status   Enterococcus faecalis NOT DETECTED NOT DETECTED Final   Enterococcus Faecium NOT DETECTED NOT DETECTED Final   Listeria monocytogenes NOT DETECTED NOT DETECTED Final   Staphylococcus species NOT DETECTED NOT DETECTED Final   Staphylococcus aureus (BCID) NOT DETECTED NOT DETECTED Final   Staphylococcus epidermidis NOT DETECTED NOT DETECTED Final   Staphylococcus lugdunensis NOT DETECTED NOT DETECTED Final   Streptococcus species DETECTED (A) NOT DETECTED Final    Comment: Not  Enterococcus species, Streptococcus agalactiae, Streptococcus pyogenes, or Streptococcus pneumoniae. CRITICAL RESULT CALLED TO, READ BACK BY AND VERIFIED WITH: Arnaldo Natal PharmD 16:30 11/23/19 ([email or web address])    Streptococcus agalactiae NOT DETECTED NOT DETECTED Final   Streptococcus pneumoniae NOT DETECTED NOT DETECTED Final   Streptococcus pyogenes NOT DETECTED NOT DETECTED Final   A.calcoaceticus-baumannii NOT DETECTED NOT DETECTED Final   Bacteroides fragilis NOT DETECTED NOT DETECTED Final   Enterobacterales NOT DETECTED NOT DETECTED Final   Enterobacter cloacae complex NOT DETECTED NOT DETECTED Final   Escherichia coli NOT DETECTED NOT DETECTED Final   Klebsiella aerogenes NOT DETECTED NOT DETECTED Final   Klebsiella oxytoca NOT DETECTED NOT DETECTED Final   Klebsiella pneumoniae NOT DETECTED NOT DETECTED Final   Proteus species NOT DETECTED NOT DETECTED Final   Salmonella species NOT DETECTED NOT DETECTED Final   Serratia marcescens NOT DETECTED NOT DETECTED Final   Haemophilus influenzae NOT DETECTED NOT DETECTED Final   Neisseria meningitidis NOT DETECTED NOT DETECTED Final   Pseudomonas aeruginosa NOT DETECTED NOT DETECTED Final   Stenotrophomonas maltophilia NOT DETECTED NOT DETECTED Final   Candida albicans NOT DETECTED NOT DETECTED Final   Candida auris NOT DETECTED NOT DETECTED Final   Candida glabrata NOT DETECTED NOT DETECTED Final   Candida krusei NOT DETECTED NOT DETECTED Final   Candida parapsilosis NOT DETECTED NOT DETECTED Final   Candida tropicalis NOT DETECTED NOT DETECTED Final   Cryptococcus neoformans/gattii NOT DETECTED NOT DETECTED Final    Comment: Performed at [facility], [address] [place], [place] [address]  Urine culture     Status: Abnormal   Collection Time: 11/22/19  5:17 PM   Specimen: In/Out Cath Urine  Result Value Ref Range Status   Specimen Description IN/OUT CATH URINE  Final   Special Requests   Final    NONE Performed at [facility], [address] [place], [place] [address]    Culture MULTIPLE SPECIES PRESENT, SUGGEST RECOLLECTION (A)  Final   Report Status 11/23/2019 FINAL  Final  MRSA PCR Screening     Status: None   Collection Time: 11/23/19  7:50 PM   Specimen: Nasal Mucosa; Nasopharyngeal  Result Value Ref Range Status   MRSA by PCR NEGATIVE NEGATIVE Final    Comment:        The GeneXpert  MRSA Assay (FDA approved for NASAL specimens only), is one component of a comprehensive MRSA colonization surveillance program. It is not intended to diagnose MRSA infection nor to guide or monitor treatment for MRSA infections. Performed at [facility], [address] [place], [place] [address]   Culture, blood (routine x 2)     Status: None   Collection Time: 11/24/19  4:12 PM   Specimen: BLOOD  Result Value Ref Range Status   Specimen Description BLOOD LEFT ANTECUBITAL  Final   Special Requests   Final    BOTTLES DRAWN AEROBIC AND ANAEROBIC Blood Culture adequate volume   Culture   Final    NO GROWTH 5 DAYS Performed at [facility], [address] [place], [place] [address]    Report Status 11/29/2019 FINAL  Final  Culture, blood (routine x 2)     Status: None   Collection Time: 11/24/19  4:14 PM   Specimen: BLOOD  Result Value Ref Range Status   Specimen Description BLOOD RIGHT ANTECUBITAL  Final   Special Requests   Final    BOTTLES DRAWN AEROBIC AND ANAEROBIC Blood Culture adequate volume   Culture   Final    NO GROWTH 5 DAYS Performed at [facility], [address] [place], [place] [address]    Report Status 11/29/2019 FINAL  Final    Kevin Barefootobert W Darshan Solanki, MD [facility] [email or web address] 11/29/2019, 3:05 PM

## 2019-11-29 NOTE — Progress Notes (Signed)
   Providing Compassionate, Quality Care - Together  Neurosurgery Consult  Referring physician: Dr. Denese Killings Reason for referral: Cervical   Chief Complaint: Difficulty in breathing/neck pain  History of Present Illness: This is a 69 year old male with a past medical history of hypertension, diabetes, asthma, hypercholesterolemia, pacemaker, obstructive sleep apnea with worsening complaints of chest pain and difficulty in breathing therefore he was brought to the emergency department.  He was found to be hypotensive and septic in which work-up ultimately revealed a positive blood culture for strep intermedius.  He was also complaining of some mild neck pain and scapular pain and shoulder pain therefore an MRI of the cervical spine was completed.  That revealed a ventral epidural abscess therefore I was consulted to see the patient.  He has no complaints of weakness or numbness.  He does complain of some intermittent tingling in his right pinky, otherwise has no other tingling complaints. Denies any bowel or bladder changes   Medications: I have reviewed the patient's current medications. Allergies: No Known Allergies  History reviewed. No pertinent family history. Social History:  has no history on file for tobacco use, alcohol use, and drug use.  ROS: 14 point review of systems was obtained which all pertinent positives and negatives are listed in HPI above  Physical Exam:  Vital signs in last 24 hours: Temp:  [98 F (36.7 C)-98.3 F (36.8 C)] 98 F (36.7 C) (07/25 1814) Pulse Rate:  [58-128] 65 (07/26 0746) Resp:  [11-18] 14 (07/26 0217) BP: (138-182)/(65-125) 153/88 (07/26 0700) SpO2:  [91 %-98 %] 96 % (07/26 0746) PE: Awake alert oriented x3 PERRLA EOMI Face symmetric Sensory intact to light touch Cranial nerves II through XII intact BUE Delt 4/5, bi/tri/grip 5/5 BLE 4+/5 throughout Very slight hoffmans No clonus in LEs  MRI C spine 11/29/2019 Anterior epidural abscess  spanning the C2-C6 levels measuring 8.9 x 2.3 x 0.9 cm with mild compression of the thecal sac. Extension of the abscess into the dorsal epidural space at the C2 level. No definite enhancement within the cord.  Communicating right paraspinal abscesses also at the C2-3 level. Communicating left prevertebral abscess at the C3-4 level.  Inflammatory changes involving the C3-4 and to a lesser degree C4-5 disc space/subjacent endplates are concerning for early discitis osteomyelitis.   Impression/Assessment:  69 yo M with:  1.  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  Bacteremia, positive strep intermedius  Plan:  -neurochecks q2h -No acute neurosurgical intervention at this time.  Due to the fact that he has mild to moderate stenosis I recommend attempting medical management with long-term IV antibiotics.  If he has a neurologic decline due to expanding abscess I would recommend neurosurgical intervention at that time. -rec repeat MRI C spine in 2 weeks for evaluation of abscess -Continue Rocephin as per ID recommendations/CCM recommendations -We will need long-term IV antibiotics -MRI reviewed, mild to moderate stenosis due to ventral abscess.  No severe cord compression noted, and no cord signal change.   Thank you for allowing me to participate in this patient's care.  Please do not hesitate to call with questions or concerns.   Monia Pouch, DO Neurosurgeon [facility] Cell: [phone]

## 2019-11-29 NOTE — Progress Notes (Signed)
BIPAP stand by. Pt is on 10 L salter and doing well at this moment.

## 2019-11-29 NOTE — Progress Notes (Signed)
NAME:  Kevin [name]., MRN:  [id number], DOB:  December 09, 1950, LOS: 7 ADMISSION DATE:  11/22/2019, CONSULTATION DATE: 11/22/2019 REFERRING MD:  Redge Gainer, ED  CHIEF COMPLAINT: Lethargy and malaise  Brief History   Patient is a 69 year old male brought to the emergency room for evaluation of a 1 to 1-1/2-week history of worsening lethargy malaise decreased p.o. intake.  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 intake, vague abdominal and chest complaints of pain, diarrhea, and decreased appetite.  In the emergency room he was noted to be hypotensive.  At this point he is receiving a liter of IV fluid and is on 9 mics of Levophed.  Systolic is 100 diastolic is 80 at this point.  He is awake alert although somewhat lethargic.  He really cannot give me any specific complaints other than that mentioned above in regards to discomfort or pain. Laboratories are pertinent for glucose of 174 with a known history of type 2 diabetes mellitus, lactic acid is 2.8, troponin is 41.  Urinalysis is fairly unremarkable.  Creatinine is 3.27 with a BUN of 118.  White count is 25 hemoglobin is 11 platelet count 165.  Past Medical History   . Diabetes mellitus without complication (HCC)   . Hypertension   Untreated sleep apnea Morbid obesity Permanent pacemaker   Significant Hospital Events   Admission to [facility], ICU 11/22/2019 TEE 8/30  Consults:  PCCM Cardiology for TEE ID, pending  Procedures:  TEE 8/30> LVEF 60-65% without wall motion abnormalities. Normal RV size and funciton. No evidence of endocarditis, pacemaker infection. No LAA thrombus  Significant Diagnostic Tests:  As above  CXR 8/31>>   Micro Data:  Blood cultures planted, urinalysis unremarkable, UA pending. 825 urine culture multiple species 2 blood  cultures on 8/25+ for strep vancomycin DC'd placed on Rocephin Antimicrobials:  Currently on Rocephin  Interim history/subjective:   At end of shift had new increasing O2 requirement from [facility] to 10L HFNC. Tolerated BiPAP overnight and is again on 10L HFNC this morning  Unfortunately, MRI c-spine not completed yesterday/overnight  Continues to remove condom cath   Objective   Blood pressure 117/68, pulse 65, temperature 98 F (36.7 C), temperature source Axillary, resp. rate 16, height 5\' 10"  (1.778 m), weight 131.2 kg, SpO2 96 %.    FiO2 (%):  [50 %] 50 %   Intake/Output Summary (Last 24 hours) at 11/29/2019 0819 Last data filed at 11/28/2019 1800 Gross per 24 hour  Intake 100.37 ml  Output 650 ml  Net -549.63 ml   Filed Weights   11/22/19 1456 11/28/19 0500  Weight: (!) 176.9 kg 131.2 kg    Examination: General: Obese older adult male reclined in bed watching tv NAD  HEENT: NCAT pink mmm. HFNC in place. Trachea midline. Anicteric sclera. Non-focal neck pain with general movement, non-tender to palpation.  Neuro: AAOx4 following commands CV: RRR s1s2 no rgm cap refill brisk  PULM: Diminished bibasilar sounds. No adventitious sounds. No accessory muscle use on 10L HFNC  GI: Obese soft round +bowel sounds  GU: Yellow urine, condom cath  Extremities: 5/5 BUE BLE strength No obvious joint deformity. No cyanosis or clubbing  Skin: c/d/w without rash    Resolved Hospital Problem list   Septic shock, improved  Assessment & Plan:   Acute encephalopathy in setting  of hypercarbia, sepsis Improving mentation with Bipap Anxiety P -minimize sedating agents   -sepsis as below  -delirium precautions  -For MRI:  0.5mg  Xanax, 25mg  Atarax   Acute on chronic hypoxic respiratory failure in the setting of obstructive sleep apnea with noncompliance with CPAP, -c/b anxiety  -8/30 b- 8/31 increasing O2 need from 5L [place] to 10L HFNC (although seems like this has overachieved SpO2 goals)  Unknown etiology-- suspect likely pulm edema  +/- atelectasis, possible infection P -qHS BiPAP  -Supplemenatal O2 for SpO2 > 90% -BDs -IS, Flutter -will check CXR today (8/31) given increasing O2 requirement  -Lasix   Sepsis due to strep intermedius bacteremia -pt previously on steroids -has PM and has been complaining of neck pain PTA  - TEE  8/30-- no evidence of endocarditis, PM wire infection  P Continue rocephin MRI C Spin to r/o abscess has been ordered, unfortunately has been delayed. Continuing to follow up Will also engage ID 8/31 for recs RE abx duration  Trend WBC, fever curve  Dm2 P  SSI   Best practice:  Diet: PO  Pain/Anxiety/Delirium protocol (if indicated): Haldol prn VAP protocol (if indicated): N/A -- IS and flutter valve ordered  DVT prophylaxis: Sub Q Heparin GI prophylaxis:NA Glucose control: SSI  Mobility:bedrest  Code Status: full Family Communication: Pt updated 8/31 Disposition:  ICU -- hopefully transfer out of ICU 8/31.   Labs   CBC: Recent Labs  Lab 11/25/19 0415 11/25/19 0415 11/25/19 1741 11/26/19 0344 11/27/19 0347 11/28/19 0715 11/29/19 0605  WBC 33.1*  --   --  18.2* 15.6* 15.9* 11.4*  NEUTROABS 25.8*  --   --  13.1* 11.6* 12.7* 9.0*  HGB 11.7*   < > 12.2* 11.2* 11.4* 10.5* 10.4*  HCT 35.0*   < > 36.0* 33.9* 35.9* 34.0* 32.8*  MCV 89.5  --   --  90.6 93.0 93.4 94.5  PLT 153  --   --  186 188 211 253   < > = values in this interval not displayed.    Basic Metabolic Panel: Recent Labs  Lab 11/23/19 0420 11/23/19 0814 11/24/19 0121 11/25/19 0415 11/25/19 1741 11/26/19 0344 11/27/19 0347  NA 136   < > 140 142 145 148* 143  K 3.5   < > 3.6 3.5 3.5 3.5 3.8  CL 100  --  102 100  --  100 99  CO2 26  --  29 31  --  37* 34*  GLUCOSE 179*  --  184* 173*  --  168* 148*  BUN 102*  --  74* 39*  --  34* 27*  CREATININE 2.49*  --  1.54* 0.98  --  1.02 0.85  CALCIUM 8.8*  --  8.9 9.4  --  9.5 9.2  MG 2.1  --  2.1 1.8  --  1.7   --   PHOS 4.0  --  3.5 2.8  --  2.6  --    < > = values in this interval not displayed.   GFR: Estimated Creatinine Clearance: 108.6 mL/min (by C-G formula based on SCr of 0.85 mg/dL). Recent Labs  Lab 11/22/19 1544 11/22/19 1700 11/22/19 1954 11/23/19 0420 11/23/19 0855 11/24/19 0121 11/26/19 0344 11/27/19 0347 11/28/19 0715 11/29/19 0605  PROCALCITON  --   --  34.99  --   --   --   --   --   --   --   WBC  --   --   --    < >  --    < >  18.2* 15.6* 15.9* 11.4*  LATICACIDVEN 2.8* 2.5*  --   --  1.6  --   --   --   --   --    < > = values in this interval not displayed.    Liver Function Tests: Recent Labs  Lab 11/22/19 1545 11/25/19 0415 11/26/19 0344 11/27/19 0347  AST 42* 38 30 28  ALT 95* 57* 45* 40  ALKPHOS 299* 246* 207* 218*  BILITOT 1.5* 1.2 1.1 1.0  PROT 5.7* 5.7* 5.9* 6.1*  ALBUMIN 1.9* 1.7* 1.7* 1.8*   Recent Labs  Lab 11/22/19 1545  LIPASE 50   No results for input(s): AMMONIA in the last 168 hours.  ABG    Component Value Date/Time   PHART 7.474 (H) 11/25/2019 1741   PCO2ART 56.4 (H) 11/25/2019 1741   PO2ART 59 (L) 11/25/2019 1741   HCO3 41.4 (H) 11/25/2019 1741   TCO2 43 (H) 11/25/2019 1741   O2SAT 91.0 11/25/2019 1741     Coagulation Profile: Recent Labs  Lab 11/22/19 1545  INR 1.1    Cardiac Enzymes: No results for input(s): CKTOTAL, CKMB, CKMBINDEX, TROPONINI in the last 168 hours.  HbA1C: Hgb A1c MFr Bld  Date/Time Value Ref Range Status  11/22/2019 07:55 PM 7.1 (H) 4.8 - 5.6 % Final    Comment:    (NOTE) Pre diabetes:          5.7%-6.4%  Diabetes:              >6.4%  Glycemic control for   <7.0% adults with diabetes     CBG: Recent Labs  Lab 11/27/19 2206 11/28/19 0741 11/28/19 1209 11/28/19 1515 11/29/19 0732  GLUCAP 135* 127* 132* 124* 136*      Tessie Fass MSN, AGACNP-BC  Pulmonary/Critical Care Medicine [phone] If no answer, [phone] 11/29/2019, 8:19 AM

## 2019-11-29 NOTE — Progress Notes (Signed)
Peripherally Inserted Central Catheter Placement  The IV Nurse has discussed with the patient and/or persons authorized to consent for the patient, the purpose of this procedure and the potential benefits and risks involved with this procedure.  The benefits include less needle sticks, lab draws from the catheter, and the patient may be discharged home with the catheter. Risks include, but not limited to, infection, bleeding, blood clot (thrombus formation), and puncture of an artery; nerve damage and irregular heartbeat and possibility to perform a PICC exchange if needed/ordered by physician.  Alternatives to this procedure were also discussed.  Bard Power PICC patient education guide, fact sheet on infection prevention and patient information card has been provided to patient /or left at bedside.    PICC Placement Documentation  PICC Double Lumen 11/29/19 PICC Right Basilic 48 cm 0 cm (Active)  Indication for Insertion or Continuance of Line Vasoactive infusions 11/29/19 2016  Exposed Catheter (cm) 0 cm 11/29/19 2016  Site Assessment Clean;Dry;Intact 11/29/19 2016  Lumen #1 Status Flushed;Saline locked;Blood return noted 11/29/19 2016  Lumen #2 Status Flushed;Saline locked;Blood return noted 11/29/19 2016  Dressing Type Transparent 11/29/19 2016  Dressing Status Clean;Dry;Intact;Antimicrobial disc in place 11/29/19 2016  Safety Lock Not Applicable 11/29/19 2016  Dressing Intervention New dressing 11/29/19 2016  Dressing Change Due 12/06/19 11/29/19 2016       Ethelda Chick 11/29/2019, 8:18 PM

## 2019-11-29 NOTE — Progress Notes (Signed)
Physical Therapy Treatment Patient Details Name: Kevin [name]. MRN: [id number] DOB: Dec 16, 1950 Today's Date: 11/29/2019    History of Present Illness 69 year old male brought to the emergency room for evaluation of a 1 to 1-1/2-week history of worsening lethargy malaise decreased p.o. intake. 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 Pt found to have acute encephalopathy in setting of hypercarbia, sepsis    PT Comments    Patient remains confused and easily fatigued with in room ambulation on O2.  He focused on needing to urinate and condom catheter off so assisted to stand several times and use urinal.  Patient able to walk more today in the room and less assist needed for supine to sit.  He remains appropriate for CIR level rehab prior to home with wife assist.  PT to follow acutely.   Follow Up Recommendations  CIR     Equipment Recommendations  Rolling walker with 5" wheels;3in1 (PT)    Recommendations for Other Services       Precautions / Restrictions Precautions Precautions: Fall Precaution Comments: monitor SpO2 (on 6 liters HFNC)    Mobility  Bed Mobility Overal bed mobility: Needs Assistance Bed Mobility: Supine to Sit     Supine to sit: Min assist     General bed mobility comments: assist for safety, lines, cues for tecnique  Transfers Overall transfer level: Needs assistance Equipment used: Rolling walker (2 wheeled)   Sit to Stand: Min assist         General transfer comment: patient pulling up on RW, assist for safety and keeping walker close  Ambulation/Gait Ambulation/Gait assistance: Min assist;+2 safety/equipment Gait Distance (Feet): 12 Feet Assistive device: Rolling walker (2 wheeled) Gait Pattern/deviations: Decreased stride length;Shuffle;Trunk flexed     General Gait Details: fatigued walking around the bed on 8L portable O2   Stairs              Wheelchair Mobility    Modified Rankin (Stroke Patients Only)       Balance Overall balance assessment: Needs assistance Sitting-balance support: Bilateral upper extremity supported Sitting balance-Leahy Scale: Good     Standing balance support: Bilateral upper extremity supported;During functional activity Standing balance-Leahy Scale: Poor Standing balance comment: reliant on UE support on RW and additional support from therapists                            Cognition Arousal/Alertness: Awake/alert Behavior During Therapy: WFL for tasks assessed/performed Overall Cognitive Status: Impaired/Different from baseline Area of Impairment: Attention;Following commands;Safety/judgement                   Current Attention Level: Sustained Memory: Decreased short-term memory Following Commands: Follows one step commands with increased time Safety/Judgement: Decreased awareness of safety;Decreased awareness of deficits   Problem Solving: Slow processing General Comments: reported did not know what time of day it was nor that his lunch had been sitting in the room, needed redirection to complete mobility tasks due to focus on neck pain      Exercises      General Comments General comments (skin integrity, edema, etc.): SpO2 dips down to 80's with transitions while on 6L HFNC vs 8L portable O2, but recovers once seated and resting      Pertinent Vitals/Pain Pain Assessment: Faces Faces Pain Scale: Hurts whole lot Pain Location: neck Pain Descriptors / Indicators: Grimacing;Guarding Pain Intervention(s): Monitored during  session;Repositioned;Heat applied    Home Living                      Prior Function            PT Goals (current goals can now be found in the care plan section) Progress towards PT goals: Progressing toward goals    Frequency    Min 3X/week      PT Plan Current plan remains appropriate    Co-evaluation               AM-PAC PT "6 Clicks" Mobility   Outcome Measure  Help needed turning from your back to your side while in a flat bed without using bedrails?: A Little Help needed moving from lying on your back to sitting on the side of a flat bed without using bedrails?: A Little Help needed moving to and from a bed to a chair (including a wheelchair)?: A Little Help needed standing up from a chair using your arms (e.g., wheelchair or bedside chair)?: A Little Help needed to walk in hospital room?: A Lot Help needed climbing 3-5 steps with a railing? : Total 6 Click Score: 15    End of Session Equipment Utilized During Treatment: Oxygen;Gait belt Activity Tolerance: Patient limited by fatigue Patient left: in chair;with call bell/phone within reach;with nursing/sitter in room Nurse Communication: Mobility status PT Visit Diagnosis: Unsteadiness on feet (R26.81);Other abnormalities of gait and mobility (R26.89);Muscle weakness (generalized) (M62.81)     Time: 1510-1550 PT Time Calculation (min) (ACUTE ONLY): 40 min  Charges:  $Gait Training: 8-22 mins $Therapeutic Activity: 23-37 mins                     Sheran Lawless, PT Acute Rehabilitation Services [phone] Office:[phone] 11/29/2019    Elray Mcgregor 11/29/2019, 5:40 PM

## 2019-11-29 NOTE — Progress Notes (Signed)
Inpatient Rehab Admissions Coordinator Note:   Per therapy recommendations, pt was screened for CIR candidacy by Estill Dooms, PT, DPT.  At this time note that pt requiring significant O2 for mobility and mobilizing with minimal assistance.  Will follow 1-2 more therapy sessions as pt may progress past needing CIR.  Please contact me with questions.   Estill Dooms, PT, DPT [phone] 11/29/19 12:47 PM

## 2019-11-30 ENCOUNTER — Encounter (HOSPITAL_COMMUNITY): Payer: Self-pay | Admitting: Physical Medicine and Rehabilitation

## 2019-11-30 ENCOUNTER — Inpatient Hospital Stay (HOSPITAL_COMMUNITY): Payer: Medicare Other

## 2019-11-30 DIAGNOSIS — G061 Intraspinal abscess and granuloma: Secondary | ICD-10-CM

## 2019-11-30 LAB — CBC WITH DIFFERENTIAL/PLATELET
Abs Immature Granulocytes: 0.11 10*3/uL — ABNORMAL HIGH (ref 0.00–0.07)
Basophils Absolute: 0 10*3/uL (ref 0.0–0.1)
Basophils Relative: 0 %
Eosinophils Absolute: 0.2 10*3/uL (ref 0.0–0.5)
Eosinophils Relative: 2 %
HCT: 31.7 % — ABNORMAL LOW (ref 39.0–52.0)
Hemoglobin: 9.8 g/dL — ABNORMAL LOW (ref 13.0–17.0)
Immature Granulocytes: 1 %
Lymphocytes Relative: 16 %
Lymphs Abs: 1.5 10*3/uL (ref 0.7–4.0)
MCH: 29.1 pg (ref 26.0–34.0)
MCHC: 30.9 g/dL (ref 30.0–36.0)
MCV: 94.1 fL (ref 80.0–100.0)
Monocytes Absolute: 0.5 10*3/uL (ref 0.1–1.0)
Monocytes Relative: 5 %
Neutro Abs: 7 10*3/uL (ref 1.7–7.7)
Neutrophils Relative %: 76 %
Platelets: 266 10*3/uL (ref 150–400)
RBC: 3.37 MIL/uL — ABNORMAL LOW (ref 4.22–5.81)
RDW: 14.9 % (ref 11.5–15.5)
WBC: 9.2 10*3/uL (ref 4.0–10.5)
nRBC: 0 % (ref 0.0–0.2)

## 2019-11-30 LAB — BASIC METABOLIC PANEL
Anion gap: 7 (ref 5–15)
BUN: 18 mg/dL (ref 8–23)
CO2: 33 mmol/L — ABNORMAL HIGH (ref 22–32)
Calcium: 8.9 mg/dL (ref 8.9–10.3)
Chloride: 97 mmol/L — ABNORMAL LOW (ref 98–111)
Creatinine, Ser: 0.79 mg/dL (ref 0.61–1.24)
GFR calc Af Amer: >60 mL/min (ref >60)
GFR calc non Af Amer: >60 mL/min (ref >60)
Glucose, Bld: 146 mg/dL — ABNORMAL HIGH (ref 70–99)
Potassium: 4 mmol/L (ref 3.5–5.1)
Sodium: 137 mmol/L (ref 135–145)

## 2019-11-30 LAB — TROPONIN I (HIGH SENSITIVITY): Troponin I (High Sensitivity): 8 ng/L (ref <18)

## 2019-11-30 LAB — GLUCOSE, CAPILLARY
Glucose-Capillary: 146 mg/dL — ABNORMAL HIGH (ref 70–99)
Glucose-Capillary: 167 mg/dL — ABNORMAL HIGH (ref 70–99)
Glucose-Capillary: 133 mg/dL — ABNORMAL HIGH (ref 70–99)
Glucose-Capillary: 153 mg/dL — ABNORMAL HIGH (ref 70–99)
Glucose-Capillary: 137 mg/dL — ABNORMAL HIGH (ref 70–99)

## 2019-11-30 LAB — C-REACTIVE PROTEIN: CRP: 9.5 mg/dL — ABNORMAL HIGH (ref <1.0)

## 2019-11-30 LAB — SEDIMENTATION RATE: Sed Rate: 101 mm/hr — ABNORMAL HIGH (ref 0–16)

## 2019-11-30 MED ORDER — MORPHINE SULFATE (PF) 2 MG/ML IV SOLN
2.0000 mg | Freq: Once | INTRAVENOUS | Status: AC
  Administered 2019-11-30: 2 mg via INTRAVENOUS
  Filled 2019-11-30: qty 1

## 2019-11-30 MED ORDER — ALBUTEROL SULFATE (2.5 MG/3ML) 0.083% IN NEBU: 2.5000 mg | INHALATION_SOLUTION | RESPIRATORY_TRACT | Status: DC | PRN

## 2019-11-30 MED ORDER — DIPHENHYDRAMINE HCL 50 MG/ML IJ SOLN
12.5000 mg | Freq: Once | INTRAMUSCULAR | Status: AC
  Administered 2019-11-30: 12.5 mg via INTRAVENOUS
  Filled 2019-11-30: qty 1

## 2019-11-30 NOTE — Progress Notes (Signed)
Occupational Therapy Treatment Patient Details Name: Kevin [name]. MRN: [id number] DOB: 1950/07/16 Today's Date: 11/30/2019    History of present illness 69 year old male brought to the emergency room for evaluation of a 1 to 1-1/2-week history of worsening lethargy malaise decreased p.o. intake. 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 Pt found to have acute encephalopathy in setting of hypercarbia, sepsis   OT comments  Patient sleeping in bed upon arrival.  Presents with multiple line and leads.  On high flow O2, with O2 sats dropping to 85% with exertion, cured for pursed lip breathing, patient rebounding to 90% on high flow.  Patient's O2 sats never got above 92%.  He remains a little lethargic, but demonstrated improved mentation and ability to participate this date. He was able to sit EOB for 10 min, and used the [facility] this date.  Dep for hygiene.  The patient still has significant declines to ADL/IADL from stated PLOF.  CIR has been consulted, continue to follow in the acute setting 2x/wk to maximize functional independence.    Follow Up Recommendations  CIR;Supervision/Assistance - 24 hour    Equipment Recommendations       Recommendations for Other Services      Precautions / Restrictions Precautions Precautions: Fall Precaution Comments: O2 sats Restrictions Weight Bearing Restrictions: No       Mobility Bed Mobility Overal bed mobility: Needs Assistance Bed Mobility: Supine to Sit;Sit to Supine     Supine to sit: Min assist;HOB elevated Sit to supine: Mod assist   General bed mobility comments: assist for safety, lines, cues for tecnique                     Balance   Sitting-balance support: Bilateral upper extremity supported Sitting balance-Leahy Scale: Good     Standing balance support: Bilateral upper extremity supported;During functional activity Standing  balance-Leahy Scale: Fair                             ADL either performed or assessed with clinical judgement   ADL Overall ADL's : Needs assistance/impaired                         Toilet Transfer: Moderate assistance;RW;BSC   Toileting- Clothing Manipulation and Hygiene: Total assistance;Sit to/from stand       Functional mobility during ADLs: Min guard;Minimal assistance;Rolling walker;Cueing for sequencing;Cueing for safety       Vision       Perception     Praxis      Cognition Arousal/Alertness: Awake/alert Behavior During Therapy: WFL for tasks assessed/performed Overall Cognitive Status: Impaired/Different from baseline Area of Impairment: Memory;Safety/judgement                       Following Commands: Follows one step commands consistently Safety/Judgement: Decreased awareness of safety              Exercises     Shoulder Instructions       General Comments      Pertinent Vitals/ Pain       Faces Pain Scale: Hurts a little bit Pain Location: patient backside Pain Descriptors / Indicators: Aching Pain Intervention(s): Monitored during session;Heat applied (returned hot packs to patient R low back)     Prior Functioning/Environment  Frequency  Min 2X/week        Progress Toward Goals  OT Goals(current goals can now be found in the care plan section)         AM-PAC OT "6 Clicks" Daily Activity     Outcome Measure   Help from another person eating meals?: A Little Help from another person taking care of personal grooming?: A Little Help from another person toileting, which includes using toliet, bedpan, or urinal?: A Lot Help from another person bathing (including washing, rinsing, drying)?: A Lot Help from another person to put on and taking off regular upper body clothing?: A Lot Help from another person to put on and taking off regular lower body clothing?: Total 6 Click Score:  13    End of Session Equipment Utilized During Treatment: Gait belt;Rolling walker  OT Visit Diagnosis: Unsteadiness on feet (R26.81);Other abnormalities of gait and mobility (R26.89);Muscle weakness (generalized) (M62.81);Pain;Other symptoms and signs involving cognitive function   Activity Tolerance Patient tolerated treatment well;No increased pain   Patient Left in bed;with bed alarm set;with nursing/sitter in room   Nurse Communication          Time: 1130-1200 OT Time Calculation (min): 30 min  Charges: OT General Charges $OT Visit: 1 Visit OT Treatments $Therapeutic Activity: 23-37 mins  11/30/2019  Rich, OTR/L  Acute Rehabilitation Services  Office:  [phone]    Suzanna Obey 11/30/2019, 1:24 PM

## 2019-11-30 NOTE — Consult Note (Signed)
Physical Medicine and Rehabilitation Consult   Reason for Consult: Functional deficits due to sepsis with encephalopathy/weakness Referring Physician: Dr. Margo Aye   HPI: Kevin [name]. is a 69 y.o. male with history of HTN, T2DM, OSA--noncompliant, PPM for HB who was admitted on 11/22/19 with one week history of malaise, poor intake as well as abdominal/chest pain. He was noted to be septic with lactic acidosis, had elevated WBC- 25.9, was lethargic, hypotensive and dehydrated at admission. He was pancultured and started on antibiotics and levophed for septic shock.  Abdominal ultrasound negative for cholecystitis or biliary dilatation and hepatic steatosis noted.  He developed acute on chronic hypoxemic respiratory failure due to volume overload requiring diuresis as well as BIPAP. Blood cultures positive for strep intermedius and CT abdomen pelvis showed subtotal collapse of BLL.  TEE showed EF  60-65% and was negative for endocarditis.   He continued to have issues with confusion as well as neck pain with movement.  MRI neck done revealing 8.9 x 2.3 X 0.9 cm epidural abscess C2-C6 with mild compression fo thecal sac, communicating right paraspinal abscess at C2/3 level and left prevertebral abscess at C3/4 level as well as concerns of early discitis/osteomyelitis C3/4> C4/5. Dr. Luciana Axe following of input and antibiotics narrowed to PCN G.  Dr. Jake Samples consulted for NS input did not feel that surgical intervention needed as patient from mild to moderate stenosis due to abscess. To continue IV antibiotics with repeat MRI in 2 weeks.  Therapy ongoing with reports of confusion, hypoxia--on 8-10 L per [place] and easily fatigued due to debility. CIR recommended due to functional decline.     Review of Systems  Constitutional: Negative for chills and fever.  HENT: Negative for hearing loss and tinnitus.   Eyes: Negative for blurred vision and double vision.  Respiratory: Positive for shortness of  breath. Negative for cough.   Cardiovascular: Positive for chest pain (band like pain lower ribs). Negative for palpitations.  Gastrointestinal: Positive for abdominal pain (mild). Negative for constipation, heartburn and nausea.  Genitourinary: Positive for dysuria. Negative for frequency and urgency.  Musculoskeletal: Positive for myalgias and neck pain.  Skin: Negative for itching and rash.  Neurological: Positive for dizziness (when up), weakness and headaches.  Psychiatric/Behavioral: The patient is nervous/anxious and has insomnia (CPAP keeps him awake).      Past Medical History:  Diagnosis Date  . Diabetes mellitus without complication (HCC)   . Hypertension   . Pacemaker     Past Surgical History:  Procedure Laterality Date  . BACK SURGERY    . STOMACH SURGERY      Family History  Problem Relation Age of Onset  . Breast cancer Mother   . Diabetes Mother   . Aneurysm Father   . Aneurysm Paternal Uncle      Social History:  Married. 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 Independent without AD PTA. He reports that he has never smoked. He has never used smokeless tobacco. He reports that he drinks beer occasionally. No history on file for drug use.    Allergies  Allergen Reactions  . Benazepril Swelling and Other (See Comments)    Lips only became swollen  . Ibuprofen Other (See Comments)    Was told by MD to not take this  . Mirabegron Other (See Comments) and Cough    Congestion and wheezing, also  . Amlodipine Swelling and Other (See Comments)    Leg edema   . Penicillins Other (See Comments)  Patient doesn't know reaction- was always told he was allergic   . Cephalexin Itching  . Molds & Smuts Other (See Comments)    "RUNNNING NOSE AND EYES"  . Oxycodone Itching  . Percodan [Oxycodone-Aspirin] Itching   Medications Prior to Admission  Medication Sig Dispense Refill  . aspirin EC 81 MG tablet Take 81 mg by mouth at bedtime.     Marland Kitchen. atorvastatin  (LIPITOR) 20 MG tablet Take 20 mg by mouth daily.     Marland Kitchen. augmented betamethasone dipropionate (DIPROLENE-AF) 0.05 % cream Apply 1 application topically 2 (two) times daily as needed (to affected areas).     . carvedilol (COREG) 6.25 MG tablet Take 6.25 mg by mouth 2 (two) times daily with a meal.    . cyclobenzaprine (FLEXERIL) 5 MG tablet Take 5 mg by mouth 3 (three) times daily as needed for muscle spasms.    . diclofenac Sodium (VOLTAREN) 1 % GEL Apply 2-4 g topically 4 (four) times daily as needed (to painful areas of shoulders and neck).    . fluticasone (FLONASE) 50 MCG/ACT nasal spray Place 2 sprays into both nostrils daily as needed for allergies or rhinitis.     . Fluticasone-Salmeterol (WIXELA INHUB) 250-50 MCG/DOSE AEPB Inhale 1 puff into the lungs in the morning.    . hydrochlorothiazide (HYDRODIURIL) 25 MG tablet Take 25 mg by mouth in the morning.     . metFORMIN (GLUCOPHAGE-XR) 500 MG 24 hr tablet Take 500 mg by mouth 2 (two) times daily after a meal.     . methylPREDNISolone (MEDROL) 4 MG tablet Take 4-24 mg by mouth See admin instructions. Take 24 mg by mouth on day one, then decrease by 4 mg daily until finished    . omeprazole (PRILOSEC) 20 MG capsule Take 20 mg by mouth daily before breakfast.     . Semaglutide,0.25 or 0.5MG /DOS, (OZEMPIC, 0.25 OR 0.5 MG/DOSE,) 2 MG/1.5ML SOPN Inject 0.5 mg into the skin every 7 (seven) days.     . tadalafil (CIALIS) 5 MG tablet Take 5 mg by mouth daily.     . urea (CARMOL) 40 % CREA Apply 1 application topically daily as needed (for itching/irritation- as directed).     Marland Kitchen. amLODipine (NORVASC) 5 MG tablet Take 1 tablet (5 mg total) by mouth daily. (Patient not taking: Reported on 11/22/2019) 14 tablet 0  . aspirin 81 MG EC tablet Take by mouth. (Patient not taking: Reported on 11/22/2019)    . atorvastatin (LIPITOR) 40 MG tablet Take 40 mg by mouth daily. (Patient not taking: Reported on 11/22/2019)    . benazepril (LOTENSIN) 20 MG tablet Take by  mouth. (Patient not taking: Reported on 11/22/2019)    . Fluticasone-Salmeterol (ADVAIR) 250-50 MCG/DOSE AEPB Inhale into the lungs. (Patient not taking: Reported on 11/22/2019)    . ibuprofen (ADVIL,MOTRIN) 200 MG tablet Take 600-800 mg by mouth every 8 (eight) hours as needed. For pain. (Patient not taking: Reported on 11/22/2019)    . ipratropium-albuterol (DUONEB) 0.5-2.5 (3) MG/3ML SOLN Inhale into the lungs. (Patient not taking: Reported on 11/22/2019)    . metFORMIN (GLUCOPHAGE) 500 MG tablet Take by mouth 2 (two) times daily with a meal. (Patient not taking: Reported on 11/22/2019)    . predniSONE (DELTASONE) 50 MG tablet Take 1 tablet (50 mg total) by mouth daily. (Patient not taking: Reported on 11/22/2019) 5 tablet 0  . tadalafil (CIALIS) 10 MG tablet Take 10 mg by mouth daily as needed for erectile dysfunction. (Patient not taking:  Reported on 11/22/2019)      Home: Home Living Family/patient expects to be discharged to:: Private residence Living Arrangements: Spouse/significant other Available Help at Discharge: Family, Available 24 hours/day Type of Home: House Home Access: Stairs to enter [employer] of Steps: 1 Entrance Stairs-Rails: None Home Layout: Two level Alternate Level Stairs-Number of Steps: unsure Bathroom Shower/Tub: Psychologist, counselling, Curtain Firefighter: Standard Home Equipment: Environmental consultant - 2 wheels  Functional History: Prior Function Level of Independence: Independent with assistive device(s) Comments: pt reports ambulating with use of RW recently (the past 34 days). Pt is very confused and history needs to be confirmed with family Functional Status:  Mobility: Bed Mobility Overal bed mobility: Needs Assistance Bed Mobility: Supine to Sit, Sit to Supine Rolling: Min assist Supine to sit: Min assist, HOB elevated Sit to supine: Mod assist General bed mobility comments: assist for safety, lines, cues for tecnique Transfers Overall transfer level:  Needs assistance Equipment used: Rolling walker (2 wheeled) Transfers: Sit to/from Stand, Stand Pivot Transfers Sit to Stand: Min assist Stand pivot transfers: Min assist, +2 physical assistance General transfer comment: patient pulling up on RW, assist for safety and keeping walker close Ambulation/Gait Ambulation/Gait assistance: Min assist, +2 safety/equipment Gait Distance (Feet): 12 Feet Assistive device: Rolling walker (2 wheeled) Gait Pattern/deviations: Decreased stride length, Shuffle, Trunk flexed General Gait Details: fatigued walking around the bed on 8L portable O2 Gait velocity: reduced Gait velocity interpretation: <1.31 ft/sec, indicative of household ambulator    ADL: ADL Overall ADL's : Needs assistance/impaired Eating/Feeding: Set up, Sitting Grooming: Set up, Sitting Upper Body Bathing: Minimal assistance, Sitting Lower Body Bathing: Total assistance Lower Body Bathing Details (indicate cue type and reason): Min A +2 sit<>stand Upper Body Dressing : Moderate assistance, Sitting Lower Body Dressing: Total assistance Lower Body Dressing Details (indicate cue type and reason): Min A +2 sit<>stand Toilet Transfer: Moderate assistance, RW, BSC Toilet Transfer Details (indicate cue type and reason): bed>recliner Toileting- Clothing Manipulation and Hygiene: Total assistance, Sit to/from stand Toileting - Clothing Manipulation Details (indicate cue type and reason): Min A +2 sit<>stand Functional mobility during ADLs: Min guard, Minimal assistance, Rolling walker, Cueing for sequencing, Cueing for safety  Cognition: Cognition Overall Cognitive Status: Impaired/Different from baseline Orientation Level: Oriented X4 Cognition Arousal/Alertness: Awake/alert Behavior During Therapy: WFL for tasks assessed/performed Overall Cognitive Status: Impaired/Different from baseline Area of Impairment: Memory, Safety/judgement Orientation Level: Place, Time [facility]  hospital, July) Current Attention Level: Sustained Memory: Decreased short-term memory Following Commands: Follows one step commands consistently Safety/Judgement: Decreased awareness of safety Awareness: Intellectual Problem Solving: Slow processing General Comments: reported did not know what time of day it was nor that his lunch had been sitting in the room, needed redirection to complete mobility tasks due to focus on neck pain   Blood pressure (!) 114/56, pulse 79, temperature 97.9 F (36.6 C), temperature source Oral, resp. rate 20, height 5\' 10"  (1.778 m), weight 131.7 kg, SpO2 93 %. Physical Exam  General: Alert and oriented x 3, No apparent distress HEENT: Head is normocephalic, On 10 L oxygen per [place].    Neck: Tends to keep neck flexed to the right. Discomfort with neck extension and likes to keep HOB elevated.  Heart: Reg rate and rhythm. No murmurs rubs or gallops Chest: Tenderness (lower torso) present.  Abdomen: Soft, non-tender, non-distended, bowel sounds positive. Extremities: No clubbing, cyanosis, or edema. Pulses are 2+ Skin: Clean and intact without signs of breakdown Neuro: Pt is cognitively appropriate with normal insight, memory,  and awareness. Cranial nerves 2-12 are intact. Tangential and slow to respond. Able to follow simple motor commands without difficulty. Tingling right pinky finger. Right deltoid weakness noted.  Strength is otherwise 4+/5 Psych: Pt's affect is appropriate. Pt is cooperative     Results for orders placed or performed during the hospital encounter of 11/22/19 (from the past 24 hour(s))  Glucose, capillary     Status: Abnormal   Collection Time: 11/29/19  3:09 PM  Result Value Ref Range   Glucose-Capillary 175 (H) 70 - 99 mg/dL  Glucose, capillary     Status: Abnormal   Collection Time: 11/29/19 10:00 PM  Result Value Ref Range   Glucose-Capillary 139 (H) 70 - 99 mg/dL  CBC with Differential/Platelet     Status: Abnormal   Collection  Time: 11/30/19  5:02 AM  Result Value Ref Range   WBC 9.2 4.0 - 10.5 K/uL   RBC 3.37 (L) 4.22 - 5.81 MIL/uL   Hemoglobin 9.8 (L) 13.0 - 17.0 g/dL   HCT 16.1 (L) 39 - 52 %   MCV 94.1 80.0 - 100.0 fL   MCH 29.1 26.0 - 34.0 pg   MCHC 30.9 30.0 - 36.0 g/dL   RDW 09.6 04.5 - 40.9 %   Platelets 266 150 - 400 K/uL   nRBC 0.0 0.0 - 0.2 %   Neutrophils Relative % 76 %   Neutro Abs 7.0 1.7 - 7.7 K/uL   Lymphocytes Relative 16 %   Lymphs Abs 1.5 0.7 - 4.0 K/uL   Monocytes Relative 5 %   Monocytes Absolute 0.5 0 - 1 K/uL   Eosinophils Relative 2 %   Eosinophils Absolute 0.2 0 - 0 K/uL   Basophils Relative 0 %   Basophils Absolute 0.0 0 - 0 K/uL   Immature Granulocytes 1 %   Abs Immature Granulocytes 0.11 (H) 0.00 - 0.07 K/uL  Basic metabolic panel     Status: Abnormal   Collection Time: 11/30/19  5:02 AM  Result Value Ref Range   Sodium 137 135 - 145 mmol/L   Potassium 4.0 3.5 - 5.1 mmol/L   Chloride 97 (L) 98 - 111 mmol/L   CO2 33 (H) 22 - 32 mmol/L   Glucose, Bld 146 (H) 70 - 99 mg/dL   BUN 18 8 - 23 mg/dL   Creatinine, Ser 8.11 0.61 - 1.24 mg/dL   Calcium 8.9 8.9 - 91.4 mg/dL   GFR calc non Af Amer >60 >60 mL/min   GFR calc Af Amer >60 >60 mL/min   Anion gap 7 5 - 15  Glucose, capillary     Status: Abnormal   Collection Time: 11/30/19  8:01 AM  Result Value Ref Range   Glucose-Capillary 146 (H) 70 - 99 mg/dL  Glucose, capillary     Status: Abnormal   Collection Time: 11/30/19 11:57 AM  Result Value Ref Range   Glucose-Capillary 167 (H) 70 - 99 mg/dL   MR CERVICAL SPINE W WO CONTRAST  Result Date: 11/29/2019 CLINICAL DATA:  Epidural abscess suspected. EXAM: MRI CERVICAL SPINE WITHOUT AND WITH CONTRAST TECHNIQUE: Multiplanar and multiecho pulse sequences of the cervical spine, to include the craniocervical junction and cervicothoracic junction, were obtained without and with intravenous contrast. CONTRAST:  10mL GADAVIST GADOBUTROL 1 MMOL/ML IV SOLN COMPARISON:  None. FINDINGS:  Circumferential epidural phlegmon at the level of the C2 body (12:7) with mild narrowing of the thecal sac. Peripherally enhancing anterior epidural fluid collection spanning the C2-C6 levels measuring 8.9 cm in  cranial caudal dimension (11:6). The epidural abscess measures up to 2.3 x 0.9 cm in maximal transaxial dimension at the C3-4 level (12:16). At this level the abscess demonstrates communication with the C3-4 disc space (11:6). At the level of the C2 body the anterior epidural collection demonstrates communication with a small right posterolateral collection (12:6) which measures up to 1.1 cm. There is multilevel extension of phlegmon into the bilateral neural foramen. The abscess results in mild compression of the thecal sac, greatest at the C5 level (12:25). Alignment: Normal. Vertebrae: Bone marrow edema and enhancement involving the C3-4 and to a lesser extent C5 vertebral bodies. Small enhancing foci involving the dorsal C3 vertebral body and inferior endplate (11:6, 7). Intradiscal edema at the C3-4 and C4-5 levels. Cord: Normal cord morphology and signal intensity. No definite enhancement within the cord. Posterior Fossa, vertebral arteries: Normal flow voids. Disc levels: Multilevel desiccation and disc space loss most prominent at the C3-4 and C4-5 levels. C2-3: Patent bilateral neural foramen. C3-4: Disc osteophyte complex with uncovertebral and facet degenerative spurring. Mild bilateral neural foraminal narrowing. C4-5: Disc osteophyte complex with superimposed right foraminal protrusion, uncovertebral and facet degenerative spurring. Moderate right and mild left neural foraminal narrowing. C5-6: Disc osteophyte complex with superimposed central protrusion, uncovertebral and bilateral facet hypertrophy. Mild spinal canal and bilateral neural foraminal narrowing. C6-7: Disc osteophyte complex with superimposed left subarticular protrusion, uncovertebral and bilateral facet hypertrophy. Mild  bilateral neural foraminal narrowing. C7-T1: Patent spinal canal and neural foramen. Paraspinal tissues: Left prevertebral space abscess measuring 2.8 x 1.2 x 0.8 cm (11:12, 12:13) communicating with the anterior epidural abscess at the C3-4 level (12:16, 11:11). Diffuse prevertebral and paraspinal inflammation. Right paraspinal space abscess measuring up to 1.2 x 0.7 cm (12:7) at the C2 level. Adjacent 1.5 x 0.7 cm peripherally enhancing right paraspinal fluid collection (12:10). IMPRESSION: Anterior epidural abscess spanning the C2-C6 levels measuring 8.9 x 2.3 x 0.9 cm with mild compression of the thecal sac. Extension of the abscess into the dorsal epidural space at the C2 level. No definite enhancement within the cord. Communicating right paraspinal abscesses also at the C2-3 level. Communicating left prevertebral abscess at the C3-4 level. Inflammatory changes involving the C3-4 and to a lesser degree C4-5 disc space/subjacent endplates are concerning for early discitis osteomyelitis. These results were called by telephone at the time of interpretation on 11/29/2019 at 12:09 pm to provider [facility] , who verbally acknowledged these results. Electronically Signed   By: Stana Bunting M.D.   On: 11/29/2019 12:20   DG CHEST PORT 1 VIEW  Result Date: 11/29/2019 CLINICAL DATA:  Hypoxia. EXAM: PORTABLE CHEST 1 VIEW COMPARISON:  11/26/2019. FINDINGS: Cardiac pacer in stable position. Cardiomegaly. Diffuse interstitial prominence consistent with interstitial edema and or pneumonitis again noted. Persistent bibasilar atelectasis again noted. Similar findings noted on prior exam. Small pleural effusions noted on today's exam. No pneumothorax. IMPRESSION: 1.  Cardiac pacer stable position.  Cardiomegaly again noted. 2. Diffuse interstitial prominence consistent interstitial edema and or pneumonitis again noted. Persistent bibasilar atelectasis again noted. Similar findings on prior exam. Small bilateral  pleural effusions noted on today's exam. Electronically Signed   By: Maisie Fus  Register   On: 11/29/2019 08:46   [place] EKG SITE RITE  Result Date: 11/29/2019 If Site Rite image not attached, placement could not be confirmed due to current cardiac rhythm.    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 Does the need for close, 24 hr/day medical supervision in  concert with the patient's rehab needs make it unreasonable for this patient to be served in a less intensive setting? Yes 2. Co-Morbidities requiring supervision/potential complications: pressure injury of the skin, strep intermedius bacteremia, obesity (41.66), insomnia, OSA 3. Due to bladder management, bowel management, safety, skin/wound care, disease management, medication administration, pain management and patient education, does the patient require 24 hr/day rehab nursing? Yes 4. Does the patient require coordinated care of a physician, rehab nurse, therapy disciplines of PT, OT to address physical and functional deficits in the context of the above medical diagnosis(es)? Yes Addressing deficits in the following areas: balance, endurance, locomotion, strength, transferring, bowel/bladder control, bathing, dressing, feeding, grooming, toileting and psychosocial support 5. Can the patient actively participate in an intensive therapy program of at least 3 hrs of therapy per day at least 5 days per week? Yes 6. The potential for patient to make measurable gains while on inpatient rehab is good 7. Anticipated functional outcomes upon discharge from inpatient rehab are modified independent  with PT, modified independent with OT, independent with SLP. 8. Estimated rehab length of stay to reach the above functional goals is: 10-14 days 9. Anticipated discharge destination: Home 10. Overall Rehab/Functional Prognosis: good  RECOMMENDATIONS: This patient's condition is appropriate for continued  rehabilitative care in the following setting: CIR Patient has agreed to participate in recommended program. Yes Note that insurance prior authorization may be required for reimbursement for recommended care.  Comment: Mr. Bale would be a great CIR candidate. He will have 24/7 support from his wife at home.   Jacquelynn Cree, PA-C 11/30/2019   I have personally performed a face to face diagnostic evaluation, including, but not limited to relevant history and physical exam findings, of this patient and developed relevant assessment and plan.  Additionally, I have reviewed and concur with the physician assistant's documentation above.  Sula Soda, MD

## 2019-11-30 NOTE — Progress Notes (Signed)
   Providing Compassionate, Quality Care - Together  NEUROSURGERY PROGRESS NOTE   S: No issues overnight.   O: EXAM:  BP (!) 114/56 (BP Location: Left Arm)   Pulse 79   Temp 97.9 F (36.6 C) (Oral)   Resp 20   Ht 5\' 10"  (1.778 m)   Wt 131.7 kg   SpO2 93%   BMI 41.66 kg/m   Awake alert oriented x3 PERRLA EOMI Face symmetric Sensory intact to light touch Cranial nerves II through XII intact BUE Delt 4/5, bi/tri/grip 5/5 BLE 4+/5 throughout Very slight hoffmans No clonus in LEs  ASSESSMENT:  69 y.o. male with  1.  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  Bacteremia, positive strep intermedius  Plan:  -neurochecks q2h -No acute neurosurgical intervention at this time.  Due to the fact that he has mild to moderate stenosis I recommend attempting medical management with long-term IV antibiotics.  If he has a neurologic decline due to expanding abscess I would recommend neurosurgical intervention at that time. -rec repeat MRI C spine in 2 weeks for evaluation of abscess -Continue Rocephin as per ID recommendations/CCM recommendations -Neuro exam is stable today.  We will sign off at this time.  Will need repeat MRI in 2 weeks.  Please call with any questions or concerns    Thank you for allowing me to participate in this patient's care.  Please do not hesitate to call with questions or concerns.   60, DO Neurosurgeon [facility] Cell: [phone]

## 2019-11-30 NOTE — Progress Notes (Addendum)
PROGRESS NOTE  Kevin L Champine Jr. TIW:[id number] DOB: [birth year] DOA: 11/22/2019 PCP: Malka So., MD  HPI/Recap of past 24 hours: Patient is a 69 year old male with hypertension, diabetes mellitus, asthma, hypercholesterolemia, Mobitz type II AV block S/p pacemaker placement & OSA  with a 1 week history of worsening p.o. intake, vague abdominal and chest complaints of pain, diarrhea, and decreased appetite.  In the emergency room he was noted to be hypotensive.  At this point he is receiving a liter of IV fluid and is on 9 mics of Levophed.  Systolic is 100 diastolic is 80 at this point.  He is awake alert although somewhat lethargic.  He really cannot give me any specific complaints other than that mentioned above in regards to discomfort or pain. Laboratories are pertinent for glucose of 174 with a known history of type 2 diabetes mellitus, lactic acid is 2.8, troponin is 41.  Urinalysis is fairly unremarkable.  Creatinine is 3.27 with a BUN of 118.  White count is 25 hemoglobin is 11 platelet count 165.  Transferred to [facility] on 11/30/2019.  11/30/19: Seen and examined at his bedside this morning.  Reports neck pain.  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  Dm2 with  hyperglycemia Continue insulin sliding scale               Severe morbid obesity               BMI 41                Recommend weight loss outpatient with help the dieting regular physical                Activity.  Physical debility PT to assess Out of bed to chair with assistance Fall precautions  Stage 2 sacral pressure ulcer, POA Local wound care    Code Status: Full code  Family Communication: None at bedside  Disposition Plan:    Consultants:  Admitted by PCCM   Antimicrobials:  Penicillin G  DVT prophylaxis: Subcu heparin 3 times daily  Status is: Inpatient    Dispo: The patient is from: Home.              Anticipated d/c is to: Home.              Anticipated d/c date is: 12/02/2019              Patient currently not stable for discharge due to ongoing treatment for bacteremia.        Objective: Vitals:   11/30/19 0720 11/30/19 0721 11/30/19 0806 11/30/19 1159  BP:   (!) 105/55 (!) 114/56  Pulse:   77 79  Resp:   19 20  Temp:   97.9 F (36.6 C)   TempSrc:   Oral Oral  SpO2: 95% 91% 92% 93%  Weight:  Height:        Intake/Output Summary (Last 24 hours) at 11/30/2019 1436 Last data filed at 11/30/2019 1000 Gross per 24 hour  Intake 241.68 ml  Output 420 ml  Net -178.32 ml   Filed Weights   11/22/19 1456 11/28/19 0500 11/30/19 0625  Weight: (!) 176.9 kg 131.2 kg 131.7 kg    Exam:   General: 69 y.o. year-old male well developed well nourished in no acute distress.  Alert and oriented x3.  Cardiovascular: Regular rate and rhythm with no rubs or gallops.  No thyromegaly or JVD noted.    Respiratory: Mild rales at bases no wheezing noted.  Poor inspiratory effort.   Abdomen: Soft nontender nondistended with normal bowel sounds x4 quadrants.  Musculoskeletal: Trace lower extremity edema bilaterally  Psychiatry: Mood is appropriate for condition and setting   Data Reviewed: CBC: Recent Labs  Lab 11/26/19 0344 11/27/19 0347  11/28/19 0715 11/29/19 0605 11/30/19 0502  WBC 18.2* 15.6* 15.9* 11.4* 9.2  NEUTROABS 13.1* 11.6* 12.7* 9.0* 7.0  HGB 11.2* 11.4* 10.5* 10.4* 9.8*  HCT 33.9* 35.9* 34.0* 32.8* 31.7*  MCV 90.6 93.0 93.4 94.5 94.1  PLT 186 188 211 253 266   Basic Metabolic Panel: Recent Labs  Lab 11/24/19 0121 11/24/19 0121 11/25/19 0415 11/25/19 0415 11/25/19 1741 11/26/19 0344 11/27/19 0347 11/29/19 0855 11/30/19 0502  NA 140   < > 142   < > 145 148* 143 138 137  K 3.6   < > 3.5   < > 3.5 3.5 3.8 4.0 4.0  CL 102   < > 100  --   --  100 99 97* 97*  CO2 29   < > 31  --   --  37* 34* 31 33*  GLUCOSE 184*   < > 173*  --   --  168* 148* 142* 146*  BUN 74*   < > 39*  --   --  34* 27* 19 18  CREATININE 1.54*   < > 0.98  --   --  1.02 0.85 0.84 0.79  CALCIUM 8.9   < > 9.4  --   --  9.5 9.2 8.9 8.9  MG 2.1  --  1.8  --   --  1.7  --   --   --   PHOS 3.5  --  2.8  --   --  2.6  --   --   --    < > = values in this interval not displayed.   GFR: Estimated Creatinine Clearance: 115.6 mL/min (by C-G formula based on SCr of 0.79 mg/dL). Liver Function Tests: Recent Labs  Lab 11/25/19 0415 11/26/19 0344 11/27/19 0347  AST 38 30 28  ALT 57* 45* 40  ALKPHOS 246* 207* 218*  BILITOT 1.2 1.1 1.0  PROT 5.7* 5.9* 6.1*  ALBUMIN 1.7* 1.7* 1.8*   No results for input(s): LIPASE, AMYLASE in the last 168 hours. No results for input(s): AMMONIA in the last 168 hours. Coagulation Profile: No results for input(s): INR, PROTIME in the last 168 hours. Cardiac Enzymes: No results for input(s): CKTOTAL, CKMB, CKMBINDEX, TROPONINI in the last 168 hours. BNP (last 3 results) No results for input(s): PROBNP in the last 8760 hours. HbA1C: No results for input(s): HGBA1C in the last 72 hours. CBG: Recent Labs  Lab 11/29/19 1133 11/29/19 1509 11/29/19 2200 11/30/19 0801 11/30/19 1157  GLUCAP 157* 175* 139* 146* 167*   Lipid Profile: No results for input(s): CHOL, HDL, LDLCALC,  TRIG, CHOLHDL, LDLDIRECT  in the last 72 hours. Thyroid Function Tests: No results for input(s): TSH, T4TOTAL, FREET4, T3FREE, THYROIDAB in the last 72 hours. Anemia Panel: No results for input(s): VITAMINB12, FOLATE, FERRITIN, TIBC, IRON, RETICCTPCT in the last 72 hours. Urine analysis:    Component Value Date/Time   COLORURINE YELLOW 11/22/2019 1722   APPEARANCEUR CLEAR 11/22/2019 1722   LABSPEC 1.030 11/22/2019 1722   PHURINE 5.0 11/22/2019 1722   GLUCOSEU NEGATIVE 11/22/2019 1722   HGBUR NEGATIVE 11/22/2019 1722   BILIRUBINUR NEGATIVE 11/22/2019 1722   KETONESUR NEGATIVE 11/22/2019 1722   PROTEINUR NEGATIVE 11/22/2019 1722   NITRITE NEGATIVE 11/22/2019 1722   LEUKOCYTESUR SMALL (A) 11/22/2019 1722   Sepsis Labs: (procalcitonin:4,lacticidven:4)  ) Recent Results (from the past 240 hour(s))  Blood culture (routine single)     Status: Abnormal   Collection Time: 11/22/19  3:07 PM   Specimen: BLOOD RIGHT HAND  Result Value Ref Range Status   Specimen Description BLOOD RIGHT HAND  Final   Special Requests   Final    BOTTLES DRAWN AEROBIC AND ANAEROBIC Blood Culture adequate volume   Culture  Setup Time   Final    GRAM POSITIVE COCCI IN BOTH AEROBIC AND ANAEROBIC BOTTLES CRITICAL VALUE NOTED.  VALUE IS CONSISTENT WITH PREVIOUSLY REPORTED AND CALLED VALUE.    Culture (A)  Final    STREPTOCOCCUS INTERMEDIUS SUSCEPTIBILITIES PERFORMED ON PREVIOUS CULTURE WITHIN THE LAST 5 DAYS. Performed at [facility], [address] [place], [place] [address]    Report Status 11/25/2019 FINAL  Final  SARS Coronavirus 2 by RT PCR (hospital order, performed in [facility] hospital lab) Nasopharyngeal Nasopharyngeal Swab     Status: None   Collection Time: 11/22/19  3:36 PM   Specimen: Nasopharyngeal Swab  Result Value Ref Range Status   SARS Coronavirus 2 NEGATIVE NEGATIVE Final    Comment: (NOTE) SARS-CoV-2 target nucleic acids are NOT DETECTED.  The SARS-CoV-2 RNA is generally detectable in  upper and lower respiratory specimens during the acute phase of infection. The lowest concentration of SARS-CoV-2 viral copies this assay can detect is 250 copies / mL. A negative result does not preclude SARS-CoV-2 infection and should not be used as the sole basis for treatment or other patient management decisions.  A negative result may occur with improper specimen collection / handling, submission of specimen other than nasopharyngeal swab, presence of viral mutation(s) within the areas targeted by this assay, and inadequate number of viral copies (<250 copies / mL). A negative result must be combined with clinical observations, patient history, and epidemiological information.  Fact Sheet for Patients:   BoilerBrush.com.cy  Fact Sheet for Healthcare Providers: https://pope.com/  This test is not yet approved or  cleared by the [place] FDA and has been authorized for detection and/or diagnosis of SARS-CoV-2 by FDA under an Emergency Use Authorization (EUA).  This EUA will remain in effect (meaning this test can be used) for the duration of the COVID-19 declaration under Section 564(b)(1) of the Act, 21 U.S.C. section 360bbb-3(b)(1), unless the authorization is terminated or revoked sooner.  Performed at [facility], [address], [place], [place] [address]   Blood culture (routine x 2)     Status: Abnormal   Collection Time: 11/22/19  4:35 PM   Specimen: BLOOD  Result Value Ref Range Status   Specimen Description BLOOD RIGHT ANTECUBITAL  Final   Special Requests   Final    BOTTLES DRAWN AEROBIC AND  ANAEROBIC Blood Culture results may not be optimal due to an inadequate volume of blood received in culture bottles   Culture  Setup Time   Final    GRAM POSITIVE COCCI ANAEROBIC BOTTLE ONLY Organism ID to follow CRITICAL RESULT CALLED TO, READ BACK BY AND VERIFIED WITH: Arnaldo NatalJ. Carney PharmD 16:30 11/23/19  ([email or web address]) Performed at [facility], [address] [place], [place] [address]    Culture STREPTOCOCCUS INTERMEDIUS (A)  Final   Report Status 11/25/2019 FINAL  Final   Organism ID, Bacteria STREPTOCOCCUS INTERMEDIUS  Final      Susceptibility   Streptococcus intermedius - MIC*    PENICILLIN <=0.06 SENSITIVE Sensitive     CEFTRIAXONE <=0.12 SENSITIVE Sensitive     ERYTHROMYCIN >=8 RESISTANT Resistant     LEVOFLOXACIN 0.5 SENSITIVE Sensitive     VANCOMYCIN 0.5 SENSITIVE Sensitive     * STREPTOCOCCUS INTERMEDIUS  Blood Culture ID Panel (Reflexed)     Status: Abnormal   Collection Time: 11/22/19  4:35 PM  Result Value Ref Range Status   Enterococcus faecalis NOT DETECTED NOT DETECTED Final   Enterococcus Faecium NOT DETECTED NOT DETECTED Final   Listeria monocytogenes NOT DETECTED NOT DETECTED Final   Staphylococcus species NOT DETECTED NOT DETECTED Final   Staphylococcus aureus (BCID) NOT DETECTED NOT DETECTED Final   Staphylococcus epidermidis NOT DETECTED NOT DETECTED Final   Staphylococcus lugdunensis NOT DETECTED NOT DETECTED Final   Streptococcus species DETECTED (A) NOT DETECTED Final    Comment: Not Enterococcus species, Streptococcus agalactiae, Streptococcus pyogenes, or Streptococcus pneumoniae. CRITICAL RESULT CALLED TO, READ BACK BY AND VERIFIED WITH: Arnaldo NatalJ. Carney PharmD 16:30 11/23/19 ([email or web address])    Streptococcus agalactiae NOT DETECTED NOT DETECTED Final   Streptococcus pneumoniae NOT DETECTED NOT DETECTED Final   Streptococcus pyogenes NOT DETECTED NOT DETECTED Final   A.calcoaceticus-baumannii NOT DETECTED NOT DETECTED Final   Bacteroides fragilis NOT DETECTED NOT DETECTED Final   Enterobacterales NOT DETECTED NOT DETECTED Final   Enterobacter cloacae complex NOT DETECTED NOT DETECTED Final   Escherichia coli NOT DETECTED NOT DETECTED Final   Klebsiella aerogenes NOT DETECTED NOT DETECTED Final   Klebsiella oxytoca NOT DETECTED NOT DETECTED Final   Klebsiella  pneumoniae NOT DETECTED NOT DETECTED Final   Proteus species NOT DETECTED NOT DETECTED Final   Salmonella species NOT DETECTED NOT DETECTED Final   Serratia marcescens NOT DETECTED NOT DETECTED Final   Haemophilus influenzae NOT DETECTED NOT DETECTED Final   Neisseria meningitidis NOT DETECTED NOT DETECTED Final   Pseudomonas aeruginosa NOT DETECTED NOT DETECTED Final   Stenotrophomonas maltophilia NOT DETECTED NOT DETECTED Final   Candida albicans NOT DETECTED NOT DETECTED Final   Candida auris NOT DETECTED NOT DETECTED Final   Candida glabrata NOT DETECTED NOT DETECTED Final   Candida krusei NOT DETECTED NOT DETECTED Final   Candida parapsilosis NOT DETECTED NOT DETECTED Final   Candida tropicalis NOT DETECTED NOT DETECTED Final   Cryptococcus neoformans/gattii NOT DETECTED NOT DETECTED Final    Comment: Performed at [facility], [address] [place], [place] [address]  Urine culture     Status: Abnormal   Collection Time: 11/22/19  5:17 PM   Specimen: In/Out Cath Urine  Result Value Ref Range Status   Specimen Description IN/OUT CATH URINE  Final   Special Requests   Final    NONE Performed at [facility], [address] [place], [place] [address]    Culture MULTIPLE SPECIES PRESENT, SUGGEST RECOLLECTION (A)  Final   Report Status 11/23/2019 FINAL  Final  MRSA PCR Screening     Status: None   Collection Time: 11/23/19  7:50 PM   Specimen: Nasal Mucosa; Nasopharyngeal  Result Value Ref Range Status   MRSA by PCR NEGATIVE NEGATIVE Final    Comment:        The GeneXpert MRSA Assay (FDA approved for NASAL specimens only), is one component of a comprehensive MRSA colonization surveillance program. It is not intended to diagnose MRSA infection nor to guide or monitor treatment for MRSA infections. Performed at [facility], [address], [place], [place] [address]   Culture, blood (routine x 2)     Status: None   Collection Time: 11/24/19  4:12 PM    Specimen: BLOOD  Result Value Ref Range Status   Specimen Description BLOOD LEFT ANTECUBITAL  Final   Special Requests   Final    BOTTLES DRAWN AEROBIC AND ANAEROBIC Blood Culture adequate volume   Culture   Final    NO GROWTH 5 DAYS Performed at [facility], [address] [place], [place] [address]    Report Status 11/29/2019 FINAL  Final  Culture, blood (routine x 2)     Status: None   Collection Time: 11/24/19  4:14 PM   Specimen: BLOOD  Result Value Ref Range Status   Specimen Description BLOOD RIGHT ANTECUBITAL  Final   Special Requests   Final    BOTTLES DRAWN AEROBIC AND ANAEROBIC Blood Culture adequate volume   Culture   Final    NO GROWTH 5 DAYS Performed at [facility], [address] [place], [place] [address]    Report Status 11/29/2019 FINAL  Final      Studies: [place] EKG SITE RITE  Result Date: 11/29/2019 If Site Rite image not attached, placement could not be confirmed due to current cardiac rhythm.   Scheduled Meds:  arformoterol  15 mcg Nebulization BID   budesonide (PULMICORT) nebulizer solution  0.5 mg Nebulization BID   Chlorhexidine Gluconate Cloth  6 each Topical Daily   furosemide  40 mg Intravenous Daily   heparin  5,000 Units Subcutaneous Q8H   hydrOXYzine  25 mg Oral Once   insulin aspart  0-20 Units Subcutaneous TID WC   ipratropium-albuterol  3 mL Nebulization BID   mouth rinse  15 mL Mouth Rinse BID   sodium chloride flush  10-40 mL Intracatheter Q12H    Continuous Infusions:  sodium chloride Stopped (11/23/19 1428)   penicillin g continuous IV infusion 12 Million Units (11/29/19 1822)     LOS: 8 days     Darlin Drop, MD [facility] Pager [phone]  If 7PM-7AM, please contact night-coverage [email or web address] Password [facility] 11/30/2019, 2:36 PM

## 2019-11-30 NOTE — Progress Notes (Signed)
[facility]   Reason for visit: Follow up on cervical ventral epidural abscess with Strep intermedius  Interval History: some improvement in pain; WBC wnl, remains afebrile.  PICC line placed.   Physical Exam: Constitutional:  Vitals:   11/30/19 0806 11/30/19 1159  BP: (!) 105/55 (!) 114/56  Pulse: 77 79  Resp: 19 20  Temp: 97.9 F (36.6 C)   SpO2: 92% 93%   patient appears in NAD Respiratory: Normal respiratory effort; CTA B Cardiovascular: RRR GI: soft, nt, nd  Review of Systems: Constitutional: negative for fevers and chills Gastrointestinal: negative for diarrhea  Lab Results  Component Value Date   WBC 9.2 11/30/2019   HGB 9.8 (L) 11/30/2019   HCT 31.7 (L) 11/30/2019   MCV 94.1 11/30/2019   PLT 266 11/30/2019    Lab Results  Component Value Date   CREATININE 0.79 11/30/2019   BUN 18 11/30/2019   NA 137 11/30/2019   K 4.0 11/30/2019   CL 97 (L) 11/30/2019   CO2 33 (H) 11/30/2019    Lab Results  Component Value Date   ALT 40 11/27/2019   AST 28 11/27/2019   ALKPHOS 218 (H) 11/27/2019     Microbiology: Recent Results (from the past 240 hour(s))  Blood culture (routine single)     Status: Abnormal   Collection Time: 11/22/19  3:07 PM   Specimen: BLOOD RIGHT HAND  Result Value Ref Range Status   Specimen Description BLOOD RIGHT HAND  Final   Special Requests   Final    BOTTLES DRAWN AEROBIC AND ANAEROBIC Blood Culture adequate volume   Culture  Setup Time   Final    GRAM POSITIVE COCCI IN BOTH AEROBIC AND ANAEROBIC BOTTLES CRITICAL VALUE NOTED.  VALUE IS CONSISTENT WITH PREVIOUSLY REPORTED AND CALLED VALUE.    Culture (A)  Final    STREPTOCOCCUS INTERMEDIUS SUSCEPTIBILITIES PERFORMED ON PREVIOUS CULTURE WITHIN THE LAST 5 DAYS. Performed at [facility], [place] [address] [place], [place] [address]    Report Status 11/25/2019 FINAL  Final  SARS Coronavirus 2 by RT PCR (hospital order, performed in [facility]  hospital lab) Nasopharyngeal Nasopharyngeal Swab     Status: None   Collection Time: 11/22/19  3:36 PM   Specimen: Nasopharyngeal Swab  Result Value Ref Range Status   SARS Coronavirus 2 NEGATIVE NEGATIVE Final    Comment: (NOTE) SARS-CoV-2 target nucleic acids are NOT DETECTED.  The SARS-CoV-2 RNA is generally detectable in upper and lower respiratory specimens during the acute phase of infection. The lowest concentration of SARS-CoV-2 viral copies this assay can detect is 250 copies / mL. A negative result does not preclude SARS-CoV-2 infection and should not be used as the sole basis for treatment or other patient management decisions.  A negative result may occur with improper specimen collection / handling, submission of specimen other than nasopharyngeal swab, presence of viral mutation(s) within the areas targeted by this assay, and inadequate number of viral copies (<250 copies / mL). A negative result must be combined with clinical observations, patient history, and epidemiological information.  Fact Sheet for Patients:   StrictlyIdeas.no  Fact Sheet for Healthcare Providers: BankingDealers.co.za  This test is not yet approved or  cleared by the [place] FDA and has been authorized for detection and/or diagnosis of SARS-CoV-2 by FDA under an Emergency Use Authorization (EUA).  This EUA will remain in effect (meaning this test can be used) for the duration of the COVID-19  declaration under Section 564(b)(1) of the Act, 21 U.S.C. section 360bbb-3(b)(1), unless the authorization is terminated or revoked sooner.  Performed at [facility], [place] [address], [place], [place] [address]   Blood culture (routine x 2)     Status: Abnormal   Collection Time: 11/22/19  4:35 PM   Specimen: BLOOD  Result Value Ref Range Status   Specimen Description BLOOD RIGHT ANTECUBITAL  Final   Special Requests   Final    BOTTLES DRAWN  AEROBIC AND ANAEROBIC Blood Culture results may not be optimal due to an inadequate volume of blood received in culture bottles   Culture  Setup Time   Final    GRAM POSITIVE COCCI ANAEROBIC BOTTLE ONLY Organism ID to follow CRITICAL RESULT CALLED TO, READ BACK BY AND VERIFIED WITH: Hyman Bible PharmD 16:30 11/23/19 ([email or web address]) Performed at [facility], [place] [address], [place], [place] [address]    Culture STREPTOCOCCUS INTERMEDIUS (A)  Final   Report Status 11/25/2019 FINAL  Final   Organism ID, Bacteria STREPTOCOCCUS INTERMEDIUS  Final      Susceptibility   Streptococcus intermedius - MIC*    PENICILLIN <=0.06 SENSITIVE Sensitive     CEFTRIAXONE <=0.12 SENSITIVE Sensitive     ERYTHROMYCIN >=8 RESISTANT Resistant     LEVOFLOXACIN 0.5 SENSITIVE Sensitive     VANCOMYCIN 0.5 SENSITIVE Sensitive     * STREPTOCOCCUS INTERMEDIUS  Blood Culture ID Panel (Reflexed)     Status: Abnormal   Collection Time: 11/22/19  4:35 PM  Result Value Ref Range Status   Enterococcus faecalis NOT DETECTED NOT DETECTED Final   Enterococcus Faecium NOT DETECTED NOT DETECTED Final   Listeria monocytogenes NOT DETECTED NOT DETECTED Final   Staphylococcus species NOT DETECTED NOT DETECTED Final   Staphylococcus aureus (BCID) NOT DETECTED NOT DETECTED Final   Staphylococcus epidermidis NOT DETECTED NOT DETECTED Final   Staphylococcus lugdunensis NOT DETECTED NOT DETECTED Final   Streptococcus species DETECTED (A) NOT DETECTED Final    Comment: Not Enterococcus species, Streptococcus agalactiae, Streptococcus pyogenes, or Streptococcus pneumoniae. CRITICAL RESULT CALLED TO, READ BACK BY AND VERIFIED WITH: Hyman Bible PharmD 16:30 11/23/19 ([email or web address])    Streptococcus agalactiae NOT DETECTED NOT DETECTED Final   Streptococcus pneumoniae NOT DETECTED NOT DETECTED Final   Streptococcus pyogenes NOT DETECTED NOT DETECTED Final   A.calcoaceticus-baumannii NOT DETECTED NOT DETECTED Final   Bacteroides fragilis NOT  DETECTED NOT DETECTED Final   Enterobacterales NOT DETECTED NOT DETECTED Final   Enterobacter cloacae complex NOT DETECTED NOT DETECTED Final   Escherichia coli NOT DETECTED NOT DETECTED Final   Klebsiella aerogenes NOT DETECTED NOT DETECTED Final   Klebsiella oxytoca NOT DETECTED NOT DETECTED Final   Klebsiella pneumoniae NOT DETECTED NOT DETECTED Final   Proteus species NOT DETECTED NOT DETECTED Final   Salmonella species NOT DETECTED NOT DETECTED Final   Serratia marcescens NOT DETECTED NOT DETECTED Final   Haemophilus influenzae NOT DETECTED NOT DETECTED Final   Neisseria meningitidis NOT DETECTED NOT DETECTED Final   Pseudomonas aeruginosa NOT DETECTED NOT DETECTED Final   Stenotrophomonas maltophilia NOT DETECTED NOT DETECTED Final   Candida albicans NOT DETECTED NOT DETECTED Final   Candida auris NOT DETECTED NOT DETECTED Final   Candida glabrata NOT DETECTED NOT DETECTED Final   Candida krusei NOT DETECTED NOT DETECTED Final   Candida parapsilosis NOT DETECTED NOT DETECTED Final   Candida tropicalis NOT DETECTED NOT DETECTED Final   Cryptococcus neoformans/gattii NOT DETECTED NOT DETECTED Final  Comment: Performed at [facility], [place] [address] [place], [place] [address]  Urine culture     Status: Abnormal   Collection Time: 11/22/19  5:17 PM   Specimen: In/Out Cath Urine  Result Value Ref Range Status   Specimen Description IN/OUT CATH URINE  Final   Special Requests   Final    NONE Performed at [facility], [place] [address] [place], [place] [address]    Culture MULTIPLE SPECIES PRESENT, SUGGEST RECOLLECTION (A)  Final   Report Status 11/23/2019 FINAL  Final  MRSA PCR Screening     Status: None   Collection Time: 11/23/19  7:50 PM   Specimen: Nasal Mucosa; Nasopharyngeal  Result Value Ref Range Status   MRSA by PCR NEGATIVE NEGATIVE Final    Comment:        The GeneXpert MRSA Assay (FDA approved for NASAL specimens only), is one component of  a comprehensive MRSA colonization surveillance program. It is not intended to diagnose MRSA infection nor to guide or monitor treatment for MRSA infections. Performed at [facility], [place] [address], [place], [place] [address]   Culture, blood (routine x 2)     Status: None   Collection Time: 11/24/19  4:12 PM   Specimen: BLOOD  Result Value Ref Range Status   Specimen Description BLOOD LEFT ANTECUBITAL  Final   Special Requests   Final    BOTTLES DRAWN AEROBIC AND ANAEROBIC Blood Culture adequate volume   Culture   Final    NO GROWTH 5 DAYS Performed at [facility], [place] [address] [place], [place] [address]    Report Status 11/29/2019 FINAL  Final  Culture, blood (routine x 2)     Status: None   Collection Time: 11/24/19  4:14 PM   Specimen: BLOOD  Result Value Ref Range Status   Specimen Description BLOOD RIGHT ANTECUBITAL  Final   Special Requests   Final    BOTTLES DRAWN AEROBIC AND ANAEROBIC Blood Culture adequate volume   Culture   Final    NO GROWTH 5 DAYS Performed at [facility], [place] [address] [place], [place] [address]    Report Status 11/29/2019 FINAL  Final    Impression/Plan:  1. 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  Repeat blood cultures remained negative.  Non-surgical approach at this time.   Plan per neurosurgery, Dr. Reatha Armour, to repeat an MRI in about 2 weeks.   Plan for 8 weeks of IV penicillin Checking ESR and CRP  2.  Access - he has a picc line in place.  3.  Medication monitoring - weekly CBC, CMP CRP and ESR every 2 weeks  Will arrange follow up in about 4 weeks after rehab I will sign off  Diagnosis: Cervical ventral epidural abscess  Culture Result: Strep intermedius, penicillin sensitive  Allergies  Allergen Reactions  . Benazepril Swelling and Other (See Comments)    Lips only became swollen  . Ibuprofen Other (See Comments)    Was told by MD to not take this  . Mirabegron  Other (See Comments) and Cough    Congestion and wheezing, also  . Amlodipine Swelling and Other (See Comments)    Leg edema   . Penicillins Other (See Comments)    Patient doesn't know reaction- was always told he was allergic   . Cephalexin Itching  . Molds & Smuts Other (See Comments)    "RUNNNING NOSE AND EYES"  .  Oxycodone Itching  . Percodan [Oxycodone-Aspirin] Itching    OPAT Orders Discharge antibiotics to be given via PICC line Discharge antibiotics: penicillin continuous infusion Per pharmacy protocol yes Duration: 8 weeks End Date: 01/16/20  [facility] Care Per Protocol:  Home health RN for IV administration and teaching; PICC line care and labs.    Labs weekly while on IV antibiotics: _x_ CBC with differential __ BMP _x_ CMP _x_ CRP _x_ ESR __ Vancomycin trough __ CK  __ Please pull PIC at completion of IV antibiotics _x_ Please leave PIC in place until doctor has seen patient or been notified  Fax weekly labs to [phone]  Clinic Follow Up Appt: 12/30/19  @ 11:15 am with Dr. West Bali

## 2019-11-30 NOTE — Progress Notes (Signed)
Inpatient Rehab Admissions Coordinator:   At this time we are recommending CIR consult.  I will place an order per our protocol.   Estill Dooms, PT, DPT Admissions Coordinator [phone] 11/30/19  12:30 PM

## 2019-11-30 NOTE — Progress Notes (Signed)
Patient is on BiPAP QHS at this time tolerating it fairly well

## 2019-12-01 LAB — CBC WITH DIFFERENTIAL/PLATELET
Abs Immature Granulocytes: 0.07 10*3/uL (ref 0.00–0.07)
Basophils Absolute: 0 10*3/uL (ref 0.0–0.1)
Basophils Relative: 0 %
Eosinophils Absolute: 0.2 10*3/uL (ref 0.0–0.5)
Eosinophils Relative: 2 %
HCT: 31.8 % — ABNORMAL LOW (ref 39.0–52.0)
Hemoglobin: 9.9 g/dL — ABNORMAL LOW (ref 13.0–17.0)
Immature Granulocytes: 1 %
Lymphocytes Relative: 17 %
Lymphs Abs: 1.4 10*3/uL (ref 0.7–4.0)
MCH: 29.8 pg (ref 26.0–34.0)
MCHC: 31.1 g/dL (ref 30.0–36.0)
MCV: 95.8 fL (ref 80.0–100.0)
Monocytes Absolute: 0.5 10*3/uL (ref 0.1–1.0)
Monocytes Relative: 6 %
Neutro Abs: 6.4 10*3/uL (ref 1.7–7.7)
Neutrophils Relative %: 74 %
Platelets: 338 10*3/uL (ref 150–400)
RBC: 3.32 MIL/uL — ABNORMAL LOW (ref 4.22–5.81)
RDW: 15.1 % (ref 11.5–15.5)
WBC: 8.6 10*3/uL (ref 4.0–10.5)
nRBC: 0 % (ref 0.0–0.2)

## 2019-12-01 LAB — GLUCOSE, CAPILLARY
Glucose-Capillary: 165 mg/dL — ABNORMAL HIGH (ref 70–99)
Glucose-Capillary: 179 mg/dL — ABNORMAL HIGH (ref 70–99)
Glucose-Capillary: 114 mg/dL — ABNORMAL HIGH (ref 70–99)
Glucose-Capillary: 151 mg/dL — ABNORMAL HIGH (ref 70–99)

## 2019-12-01 LAB — TROPONIN I (HIGH SENSITIVITY): Troponin I (High Sensitivity): 8 ng/L (ref <18)

## 2019-12-01 NOTE — Progress Notes (Signed)
Physical Therapy Treatment Patient Details Name: Kevin [name]. MRN: [id number] DOB: [birth year] Today's Date: 12/01/2019    History of Present Illness 69 year old male brought to the emergency room for evaluation of a 1 to 1-1/2-week history of worsening lethargy malaise decreased p.o. intake. 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 Pt found to have acute encephalopathy in setting of hypercarbia, sepsis    PT Comments    Pt on BiPAP most of the morning and eager to move around with therapy. Pt reports he doesn't want to get up to chair but would like to sit on the EoB for awhile. Pt is limited in safe mobility by increased O2 demand, pain in neck and L shoulder in presence of decreased balance, strength and endurance. Pt is currently min A for bed mobility, transfers and sidestepping along EoB. Pt with c/o dizziness sitting EoB with acceptable BP. Pt report he often feels that way when he first gets up. Pt requiring modA for balance initially but able to progress to min guard after about 5 minutes of sitting. Pt with increased work of breathing with LE exercises. Pt is very motivated to return to PLOF and has good support from his wife making him a good candidate for CIR level rehab at discharge. PT will continue to follow acutely.     Follow Up Recommendations  CIR     Equipment Recommendations  Rolling walker with 5" wheels;3in1 (PT)       Precautions / Restrictions Precautions Precautions: Fall Precaution Comments: monitor SpO2 (on 10 liters HFNC) Restrictions Weight Bearing Restrictions: No    Mobility  Bed Mobility Overal bed mobility: Needs Assistance Bed Mobility: Supine to Sit     Supine to sit: Min assist Sit to supine: Min assist   General bed mobility comments: limited by shortness of breath on intial power up requiring min A to come to fully upright, min A for management of L  LE back into bed  Transfers Overall transfer level: Needs assistance     Sit to Stand: Min assist         General transfer comment: patient able to utilize back of straightback chair to pull up into standing with min A for total upright  Ambulation/Gait Ambulation/Gait assistance: Min assist Gait Distance (Feet): 2 Feet Assistive device: 1 person hand held assist Gait Pattern/deviations: Decreased stride length;Shuffle;Trunk flexed Gait velocity: slowed  Gait velocity interpretation: <1.31 ft/sec, indicative of household ambulator General Gait Details: minA for steadying with sidestepping toward HoB          Balance Overall balance assessment: Needs assistance Sitting-balance support: Bilateral upper extremity supported Sitting balance-Leahy Scale: Fair Sitting balance - Comments: initially requires modA to maintain seated balance with sitting duration able to place UE in lap and maintain balance.    Standing balance support: Bilateral upper extremity supported;During functional activity Standing balance-Leahy Scale: Poor Standing balance comment: reliant on UE support on RW and additional support from therapists                            Cognition Arousal/Alertness: Awake/alert Behavior During Therapy: WFL for tasks assessed/performed Overall Cognitive Status: Impaired/Different from baseline Area of Impairment: Following commands;Problem solving                       Following Commands: Follows multi-step commands with increased time  Problem Solving: Slow processing;Decreased initiation General Comments: reported did not know what time of day it was nor that his lunch had been sitting in the room, needed redirection to complete mobility tasks due to focus on neck pain      Exercises General Exercises - Lower Extremity Ankle Circles/Pumps: 10 reps;AROM;Seated Hip Flexion/Marching: AROM;Both;10 reps;Seated    General Comments General  comments (skin integrity, edema, etc.): Pt on 10 L O2 via HFNC and able to maintain SaO2 > 90%O2 throughout session despite numerous bouts of 3/4 DoE with activity       Pertinent Vitals/Pain Pain Assessment: 0-10 Pain Score: 8  Faces Pain Scale: Hurts whole lot Pain Location: neck and L shoulder Pain Descriptors / Indicators: Grimacing;Guarding Pain Intervention(s): Limited activity within patient's tolerance;Monitored during session;Repositioned;Other (comment) (applied topical pain medication )           PT Goals (current goals can now be found in the care plan section) Acute Rehab PT Goals Patient Stated Goal: to be free of pain and be able to walk PT Goal Formulation: With patient Time For Goal Achievement: 12/10/19 Potential to Achieve Goals: Good Progress towards PT goals: Progressing toward goals    Frequency    Min 3X/week      PT Plan Current plan remains appropriate       AM-PAC PT "6 Clicks" Mobility   Outcome Measure  Help needed turning from your back to your side while in a flat bed without using bedrails?: A Little Help needed moving from lying on your back to sitting on the side of a flat bed without using bedrails?: A Little Help needed moving to and from a bed to a chair (including a wheelchair)?: A Little Help needed standing up from a chair using your arms (e.g., wheelchair or bedside chair)?: A Little Help needed to walk in hospital room?: A Lot Help needed climbing 3-5 steps with a railing? : Total 6 Click Score: 15    End of Session Equipment Utilized During Treatment: Oxygen Activity Tolerance: Patient limited by fatigue;Patient limited by pain Patient left: with call bell/phone within reach;in bed Nurse Communication: Mobility status PT Visit Diagnosis: Unsteadiness on feet (R26.81);Other abnormalities of gait and mobility (R26.89);Muscle weakness (generalized) (M62.81)     Time: 0093-8182 PT Time Calculation (min) (ACUTE ONLY): 44  min  Charges:  $Therapeutic Exercise: 8-22 mins $Therapeutic Activity: 23-37 mins                     Elanna Bert B. Beverely Risen PT, DPT Acute Rehabilitation Services Pager [phone] Office [phone]    Elon Alas Fleet 12/01/2019, 5:04 PM

## 2019-12-01 NOTE — Progress Notes (Signed)
Inpatient Rehabilitation Admissions Coordinator  Inpatient rehab consul received. I met with patient and his wife at bedside. Patient on BIPAP. We discussed rehab venue options and CIR goals and expectations . Not at a level to fully assess candidacy at this time. I will follow from a distance.  Danne Baxter, RN, MSN Rehab Admissions Coordinator [phone] 12/01/2019 10:52 AM

## 2019-12-01 NOTE — Progress Notes (Signed)
PROGRESS NOTE  Kevin L Bevill Jr. 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 intake, vague abdominal and chest pain, diarrhea, and decreased appetite.    In the emergency room he was noted to be in septic shock and was started on vasopressor.  Transferred to the ICU.  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  ID was consulted and followed, recommended IV penicillin x8 weeks.  Neurosurgery was also consulted and recommended to repeat an MRI in about 2 weeks.   Transferred to [facility] on 11/30/2019.  12/01/19: Seen and examined at his bedside this morning.  On BiPAP.  Not in distress.  Has no new complaints.   Assessment/Plan: Active Problems:   Hypotension   Pressure injury of skin  Acute metabolic encephalopathy secondary to hypercarbia  Presented with hypoxia and hypercarbia on ABG Mentation has improved with BiPAP, continue nightly Reorient as needed  Septic shock, resolved, secondary to cervical ventral epidural abscess with strep intermedius on blood culture Presented severely hypotensive requiring vasopressors and admitted to ICU, initially received broad IV antibiotics Currently on IV penicillin as recommended by infectious disease Afebrile with no leukocytosis Repeat blood cultures done on 11/24/2019 - final.  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   Physical debility PT recommended CIR Out of bed to chair with assistance Fall precautions  Stage 2 sacral pressure ulcer, POA Continue local wound care    Code Status: Full code  Family Communication: None at bedside   Consultants:  Admitted by PCCM   Antimicrobials:  Penicillin G  DVT prophylaxis: Subcu heparin 3 times daily  Disposition:  Status is: Inpatient    Dispo: The patient is from: Home.              Anticipated d/c is to: Home.              Anticipated d/c date is: 12/02/2019              Patient currently stable for discharge awaiting bed placement at CIR.      Objective: Vitals:   12/01/19 0845 12/01/19 0900 12/01/19 0917 12/01/19 1245  BP:   91/68 117/65  Pulse:  75  78  Resp:  (!) 22  (!) 30  Temp:  98.8 F (37.1 C) 98.7 F (37.1 C)  TempSrc:   Axillary Oral  SpO2: 93% 94%  93%  Weight:      Height:        Intake/Output Summary (Last 24 hours) at 12/01/2019 1530 Last data filed at 12/01/2019 1246 Gross per 24 hour  Intake 1286.79 ml  Output 851 ml  Net 435.79 ml   Filed Weights   11/28/19 0500 11/30/19 0625 12/01/19 0402  Weight: 131.2 kg 131.7 kg 128.8 kg    Exam:  . General: 69 y.o. year-old male obese no acute distress.  Alert and interactive.  On BiPAP. Marland Kitchen Cardiovascular: Regular rate and rhythm no rubs or gallops.   Marland Kitchen Respiratory: Mild respiratory no wheezing noted. . Abdomen: Obese nontender normal  bowel sounds present.  . Musculoskeletal: No lower extremity edema in bilaterally.   Marland Kitchen Psychiatry: Mood is appropriate for condition and setting.  Data Reviewed: CBC: Recent Labs  Lab 11/27/19 0347 11/28/19 0715 11/29/19 0605 11/30/19 0502 12/01/19 0507  WBC 15.6* 15.9* 11.4* 9.2 8.6  NEUTROABS 11.6* 12.7* 9.0* 7.0 6.4  HGB 11.4* 10.5* 10.4* 9.8* 9.9*  HCT 35.9* 34.0* 32.8* 31.7* 31.8*  MCV 93.0 93.4 94.5 94.1 95.8  PLT 188 211 253 266 338   Basic Metabolic Panel: Recent Labs  Lab 11/25/19 0415 11/25/19 0415 11/25/19 1741 11/26/19 0344 11/27/19 0347 11/29/19 0855 11/30/19 0502  NA 142   < > 145 148* 143 138 137  K 3.5   < > 3.5 3.5 3.8 4.0 4.0  CL 100  --   --  100 99 97* 97*  CO2 31  --   --  37* 34* 31 33*  GLUCOSE 173*  --   --  168* 148* 142* 146*  BUN 39*  --   --  34* 27* 19 18  CREATININE 0.98  --   --  1.02 0.85 0.84 0.79  CALCIUM 9.4  --   --  9.5 9.2 8.9 8.9  MG 1.8  --   --  1.7  --   --   --   PHOS 2.8  --   --  2.6  --   --   --    < > = values in this interval not displayed.   GFR: Estimated Creatinine Clearance: 114.2 mL/min (by C-G formula based on SCr of 0.79 mg/dL). Liver Function Tests: Recent Labs  Lab 11/25/19 0415 11/26/19 0344 11/27/19 0347  AST 38 30 28  ALT 57* 45* 40  ALKPHOS 246* 207* 218*  BILITOT 1.2 1.1 1.0  PROT 5.7* 5.9* 6.1*  ALBUMIN 1.7* 1.7* 1.8*   No results for input(s): LIPASE, AMYLASE in the last 168 hours. No results for input(s): AMMONIA in the last 168 hours. Coagulation Profile: No results for input(s): INR, PROTIME in the last 168 hours. Cardiac Enzymes: No results for input(s): CKTOTAL, CKMB, CKMBINDEX, TROPONINI in the last 168 hours. BNP (last 3 results) No results for input(s): PROBNP in the last 8760 hours. HbA1C: No results for input(s): HGBA1C in the last 72 hours. CBG: Recent Labs  Lab 11/30/19 1649 11/30/19 1710 11/30/19 2127 12/01/19 0913 12/01/19 1242  GLUCAP 133* 153* 137* 165* 179*    Lipid Profile: No results for input(s): CHOL, HDL, LDLCALC, TRIG, CHOLHDL, LDLDIRECT in the last 72 hours. Thyroid Function Tests: No results for input(s): TSH, T4TOTAL, FREET4, T3FREE, THYROIDAB in the last 72 hours. Anemia Panel: No results for input(s): VITAMINB12, FOLATE, FERRITIN, TIBC, IRON, RETICCTPCT in the last 72  hours. Urine analysis:    Component Value Date/Time   COLORURINE YELLOW 11/22/2019 1722   APPEARANCEUR CLEAR 11/22/2019 1722   LABSPEC 1.030 11/22/2019 1722   PHURINE 5.0 11/22/2019 1722   GLUCOSEU NEGATIVE 11/22/2019 1722   HGBUR NEGATIVE 11/22/2019 1722   BILIRUBINUR NEGATIVE 11/22/2019 1722   KETONESUR NEGATIVE 11/22/2019 1722   PROTEINUR NEGATIVE 11/22/2019 1722   NITRITE NEGATIVE 11/22/2019 1722   LEUKOCYTESUR SMALL (A) 11/22/2019 1722   Sepsis Labs: @LABRCNTIP (procalcitonin:4,lacticidven:4)  ) Recent Results (from the past 240 hour(s))  Blood culture (routine single)     Status: Abnormal   Collection Time: 11/22/19  3:07 PM   Specimen: BLOOD RIGHT HAND  Result Value Ref Range Status   Specimen Description BLOOD RIGHT HAND  Final   Special Requests   Final    BOTTLES DRAWN AEROBIC AND ANAEROBIC Blood Culture adequate volume   Culture  Setup Time   Final    GRAM POSITIVE COCCI IN BOTH AEROBIC AND ANAEROBIC BOTTLES CRITICAL VALUE NOTED.  VALUE IS CONSISTENT WITH PREVIOUSLY REPORTED AND CALLED VALUE.    Culture (A)  Final    STREPTOCOCCUS INTERMEDIUS SUSCEPTIBILITIES PERFORMED ON PREVIOUS CULTURE WITHIN THE LAST 5 DAYS. Performed at [facility], [address] [place], [place] [address]    Report Status 11/25/2019 FINAL  Final  SARS Coronavirus 2 by RT PCR (hospital order, performed in [facility] hospital lab) Nasopharyngeal Nasopharyngeal Swab     Status: None   Collection Time: 11/22/19  3:36 PM   Specimen: Nasopharyngeal Swab  Result Value Ref Range Status   SARS Coronavirus 2 NEGATIVE NEGATIVE Final    Comment: (NOTE) SARS-CoV-2  target nucleic acids are NOT DETECTED.  The SARS-CoV-2 RNA is generally detectable in upper and lower respiratory specimens during the acute phase of infection. The lowest concentration of SARS-CoV-2 viral copies this assay can detect is 250 copies / mL. A negative result does not preclude SARS-CoV-2 infection and should not be used as the sole basis for treatment or other patient management decisions.  A negative result may occur with improper specimen collection / handling, submission of specimen other than nasopharyngeal swab, presence of viral mutation(s) within the areas targeted by this assay, and inadequate number of viral copies (<250 copies / mL). A negative result must be combined with clinical observations, patient history, and epidemiological information.  Fact Sheet for Patients:   BoilerBrush.com.cyhttps://www.fda.gov/media/136312/download  Fact Sheet for Healthcare Providers: https://pope.com/https://www.fda.gov/media/136313/download  This test is not yet approved or  cleared by the [place] FDA and has been authorized for detection and/or diagnosis of SARS-CoV-2 by FDA under an Emergency Use Authorization (EUA).  This EUA will remain in effect (meaning this test can be used) for the duration of the COVID-19 declaration under Section 564(b)(1) of the Act, 21 U.S.C. section 360bbb-3(b)(1), unless the authorization is terminated or revoked sooner.  Performed at [facility], [address] [place], [place] [address]   Blood culture (routine x 2)     Status: Abnormal   Collection Time: 11/22/19  4:35 PM   Specimen: BLOOD  Result Value Ref Range Status   Specimen Description BLOOD RIGHT ANTECUBITAL  Final   Special Requests   Final    BOTTLES DRAWN AEROBIC AND ANAEROBIC Blood Culture results may not be optimal due to an inadequate volume of blood received in culture bottles   Culture  Setup Time   Final    GRAM POSITIVE COCCI ANAEROBIC BOTTLE ONLY Organism ID to follow CRITICAL  RESULT  CALLED TO, READ BACK BY AND VERIFIED WITH: Arnaldo Natal PharmD 16:30 11/23/19 ([email or web address]) Performed at [facility], [address], [place], [place] [address]    Culture STREPTOCOCCUS INTERMEDIUS (A)  Final   Report Status 11/25/2019 FINAL  Final   Organism ID, Bacteria STREPTOCOCCUS INTERMEDIUS  Final      Susceptibility   Streptococcus intermedius - MIC*    PENICILLIN <=0.06 SENSITIVE Sensitive     CEFTRIAXONE <=0.12 SENSITIVE Sensitive     ERYTHROMYCIN >=8 RESISTANT Resistant     LEVOFLOXACIN 0.5 SENSITIVE Sensitive     VANCOMYCIN 0.5 SENSITIVE Sensitive     * STREPTOCOCCUS INTERMEDIUS  Blood Culture ID Panel (Reflexed)     Status: Abnormal   Collection Time: 11/22/19  4:35 PM  Result Value Ref Range Status   Enterococcus faecalis NOT DETECTED NOT DETECTED Final   Enterococcus Faecium NOT DETECTED NOT DETECTED Final   Listeria monocytogenes NOT DETECTED NOT DETECTED Final   Staphylococcus species NOT DETECTED NOT DETECTED Final   Staphylococcus aureus (BCID) NOT DETECTED NOT DETECTED Final   Staphylococcus epidermidis NOT DETECTED NOT DETECTED Final   Staphylococcus lugdunensis NOT DETECTED NOT DETECTED Final   Streptococcus species DETECTED (A) NOT DETECTED Final    Comment: Not Enterococcus species, Streptococcus agalactiae, Streptococcus pyogenes, or Streptococcus pneumoniae. CRITICAL RESULT CALLED TO, READ BACK BY AND VERIFIED WITH: Arnaldo Natal PharmD 16:30 11/23/19 ([email or web address])    Streptococcus agalactiae NOT DETECTED NOT DETECTED Final   Streptococcus pneumoniae NOT DETECTED NOT DETECTED Final   Streptococcus pyogenes NOT DETECTED NOT DETECTED Final   A.calcoaceticus-baumannii NOT DETECTED NOT DETECTED Final   Bacteroides fragilis NOT DETECTED NOT DETECTED Final   Enterobacterales NOT DETECTED NOT DETECTED Final   Enterobacter cloacae complex NOT DETECTED NOT DETECTED Final   Escherichia coli NOT DETECTED NOT DETECTED Final   Klebsiella aerogenes NOT DETECTED NOT DETECTED  Final   Klebsiella oxytoca NOT DETECTED NOT DETECTED Final   Klebsiella pneumoniae NOT DETECTED NOT DETECTED Final   Proteus species NOT DETECTED NOT DETECTED Final   Salmonella species NOT DETECTED NOT DETECTED Final   Serratia marcescens NOT DETECTED NOT DETECTED Final   Haemophilus influenzae NOT DETECTED NOT DETECTED Final   Neisseria meningitidis NOT DETECTED NOT DETECTED Final   Pseudomonas aeruginosa NOT DETECTED NOT DETECTED Final   Stenotrophomonas maltophilia NOT DETECTED NOT DETECTED Final   Candida albicans NOT DETECTED NOT DETECTED Final   Candida auris NOT DETECTED NOT DETECTED Final   Candida glabrata NOT DETECTED NOT DETECTED Final   Candida krusei NOT DETECTED NOT DETECTED Final   Candida parapsilosis NOT DETECTED NOT DETECTED Final   Candida tropicalis NOT DETECTED NOT DETECTED Final   Cryptococcus neoformans/gattii NOT DETECTED NOT DETECTED Final    Comment: Performed at [facility], [address] [place], [place] [address]  Urine culture     Status: Abnormal   Collection Time: 11/22/19  5:17 PM   Specimen: In/Out Cath Urine  Result Value Ref Range Status   Specimen Description IN/OUT CATH URINE  Final   Special Requests   Final    NONE Performed at [facility], [address], [place], [place] [address]    Culture MULTIPLE SPECIES PRESENT, SUGGEST RECOLLECTION (A)  Final   Report Status 11/23/2019 FINAL  Final  MRSA PCR Screening     Status: None   Collection Time: 11/23/19  7:50 PM   Specimen: Nasal Mucosa; Nasopharyngeal  Result Value Ref Range Status   MRSA  by PCR NEGATIVE NEGATIVE Final    Comment:        The GeneXpert MRSA Assay (FDA approved for NASAL specimens only), is one component of a comprehensive MRSA colonization surveillance program. It is not intended to diagnose MRSA infection nor to guide or monitor treatment for MRSA infections. Performed at [facility], [address] [place], [place] [address]   Culture,  blood (routine x 2)     Status: None   Collection Time: 11/24/19  4:12 PM   Specimen: BLOOD  Result Value Ref Range Status   Specimen Description BLOOD LEFT ANTECUBITAL  Final   Special Requests   Final    BOTTLES DRAWN AEROBIC AND ANAEROBIC Blood Culture adequate volume   Culture   Final    NO GROWTH 5 DAYS Performed at [facility], [address], [place], [place] [address]    Report Status 11/29/2019 FINAL  Final  Culture, blood (routine x 2)     Status: None   Collection Time: 11/24/19  4:14 PM   Specimen: BLOOD  Result Value Ref Range Status   Specimen Description BLOOD RIGHT ANTECUBITAL  Final   Special Requests   Final    BOTTLES DRAWN AEROBIC AND ANAEROBIC Blood Culture adequate volume   Culture   Final    NO GROWTH 5 DAYS Performed at [facility], [address] [place], [place] [address]    Report Status 11/29/2019 FINAL  Final      Studies: DG CHEST PORT 1 VIEW  Result Date: 11/30/2019 CLINICAL DATA:  Shortness of breath.  Follow-up CHF and effusions. EXAM: PORTABLE CHEST 1 VIEW COMPARISON:  11/29/2019 and earlier. FINDINGS: Markedly suboptimal inspiration. Stable marked cardiomegaly. Resolution of interstitial pulmonary edema since yesterday, with mild pulmonary venous hypertension persisting. Stable BILATERAL pleural effusions and associated consolidation in the lower lobes. LEFT subclavian dual lead transvenous pacemaker unchanged. No new pulmonary parenchymal abnormalities. IMPRESSION: 1. Resolution of interstitial pulmonary edema since yesterday, with mild pulmonary venous hypertension persisting. 2. Stable BILATERAL pleural effusions and associated passive atelectasis and/or pneumonia involving the lower lobes. 3. No new abnormalities. Electronically Signed   By: Hulan Saas M.D.   On: 11/30/2019 20:32    Scheduled Meds: . arformoterol  15 mcg Nebulization BID  . budesonide (PULMICORT) nebulizer solution  0.5 mg Nebulization BID  . Chlorhexidine  Gluconate Cloth  6 each Topical Daily  . furosemide  40 mg Intravenous Daily  . heparin  5,000 Units Subcutaneous Q8H  . hydrOXYzine  25 mg Oral Once  . insulin aspart  0-20 Units Subcutaneous TID WC  . ipratropium-albuterol  3 mL Nebulization BID  . mouth rinse  15 mL Mouth Rinse BID  . sodium chloride flush  10-40 mL Intracatheter Q12H    Continuous Infusions: . sodium chloride Stopped (11/23/19 1428)  . penicillin g continuous IV infusion 12 Million Units (12/01/19 0434)     LOS: 9 days     Darlin Drop, MD [facility] Pager [phone]  If 7PM-7AM, please contact night-coverage [email or web address] Password [email or web address] 12/01/2019, 3:30 PM

## 2019-12-01 NOTE — Progress Notes (Signed)
PT Cancellation Note  Patient Details Name: Kevin [name]. MRN: [id number] DOB: [birth year]   Cancelled Treatment:    Reason Eval/Treat Not Completed: (P) Medical issues which prohibited therapy Pt placed on BiPAP. PT will follow back later as respiratory status improves.   Kysean Sweet B. Beverely Risen PT, DPT Acute Rehabilitation Services Pager [phone] Office [phone]   [facility] 12/01/2019, 8:58 AM

## 2019-12-02 LAB — BLOOD GAS, ARTERIAL
Acid-Base Excess: 12.8 mmol/L — ABNORMAL HIGH (ref 0.0–2.0)
Bicarbonate: 37.5 mmol/L — ABNORMAL HIGH (ref 20.0–28.0)
Drawn by: 358491
FIO2: 48
O2 Saturation: 95.4 %
Patient temperature: 37
pCO2 arterial: 54.1 mmHg — ABNORMAL HIGH (ref 32.0–48.0)
pH, Arterial: 7.456 — ABNORMAL HIGH (ref 7.350–7.450)
pO2, Arterial: 77.6 mmHg — ABNORMAL LOW (ref 83.0–108.0)

## 2019-12-02 LAB — BASIC METABOLIC PANEL
Anion gap: 5 (ref 5–15)
BUN: 12 mg/dL (ref 8–23)
CO2: 36 mmol/L — ABNORMAL HIGH (ref 22–32)
Calcium: 9 mg/dL (ref 8.9–10.3)
Chloride: 95 mmol/L — ABNORMAL LOW (ref 98–111)
Creatinine, Ser: 0.7 mg/dL (ref 0.61–1.24)
GFR calc Af Amer: >60 mL/min (ref >60)
GFR calc non Af Amer: >60 mL/min (ref >60)
Glucose, Bld: 136 mg/dL — ABNORMAL HIGH (ref 70–99)
Potassium: 4.3 mmol/L (ref 3.5–5.1)
Sodium: 136 mmol/L (ref 135–145)

## 2019-12-02 LAB — GLUCOSE, CAPILLARY
Glucose-Capillary: 133 mg/dL — ABNORMAL HIGH (ref 70–99)
Glucose-Capillary: 132 mg/dL — ABNORMAL HIGH (ref 70–99)
Glucose-Capillary: 126 mg/dL — ABNORMAL HIGH (ref 70–99)
Glucose-Capillary: 173 mg/dL — ABNORMAL HIGH (ref 70–99)

## 2019-12-02 MED ORDER — FUROSEMIDE 10 MG/ML IJ SOLN
20.0000 mg | Freq: Three times a day (TID) | INTRAMUSCULAR | Status: DC
  Administered 2019-12-02 – 2019-12-03 (×6): 20 mg via INTRAVENOUS
  Filled 2019-12-02 (×6): qty 2

## 2019-12-02 MED ORDER — IPRATROPIUM-ALBUTEROL 0.5-2.5 (3) MG/3ML IN SOLN
3.0000 mL | Freq: Four times a day (QID) | RESPIRATORY_TRACT | Status: DC
  Administered 2019-12-02 – 2019-12-03 (×3): 3 mL via RESPIRATORY_TRACT
  Filled 2019-12-02 (×3): qty 3

## 2019-12-02 NOTE — Progress Notes (Signed)
Inpatient Rehabilitation Admissions Coordinator  I await further progress with therapy due to high oxygen demands of 10 to 12 liters HFNC today with BIPAP at HS. We will follow up next week on his progress to assist with planing rehab venue options. Insurance closed on Monday due to the holiday. I contacted Dr. Margo Aye with the plan .  Ottie Glazier, RN, MSN Rehab Admissions Coordinator [phone] 12/02/2019 1:02 PM

## 2019-12-02 NOTE — Progress Notes (Signed)
Physical Therapy Treatment Patient Details Name: Kevin [name]. MRN: [id number] DOB: 03-Aug-1950 Today's Date: 12/02/2019    History of Present Illness 69 year old male brought to the emergency room for evaluation of a 1 to 1-1/2-week history of worsening lethargy malaise decreased p.o. intake. 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 Pt found to have acute encephalopathy in setting of hypercarbia, sepsis    PT Comments    Pt is less fatigued and happy to work with therapy today. Pt continues to be limited in safe mobility by increased oxygen demand, and pain in his neck and R shoulder in presence of generalized weakness and decreased endurance. Pt is min A for bed mobility, transfer and ambulation of 5 feet with RW. Pt with slight oxygen desaturation with ambulation. Pt performed marching in place however during marching pt urinated and condom cath fell off. Pt cleaned up and left sitting in recliner. D/c plan remains appropriate at this time. PT will continue to follow acutely.     Follow Up Recommendations  CIR     Equipment Recommendations  Rolling walker with 5" wheels;3in1 (PT)       Precautions / Restrictions Precautions Precautions: Fall Precaution Comments: monitor SpO2 (on 12 liters HFNC) Restrictions Weight Bearing Restrictions: No    Mobility  Bed Mobility Overal bed mobility: Needs Assistance Bed Mobility: Supine to Sit     Supine to sit: Min assist     General bed mobility comments: pt able to bring LE to off bed, requires assist to bring trunk to upright   Transfers Overall transfer level: Needs assistance     Sit to Stand: Min assist         General transfer comment: good power up requires min A for initial steadying in standing   Ambulation/Gait Ambulation/Gait assistance: Min assist Gait Distance (Feet): 5 Feet Assistive device: Rolling walker (2  wheeled) Gait Pattern/deviations: Decreased stride length;Shuffle;Trunk flexed;Step-to pattern Gait velocity: slowed  Gait velocity interpretation: <1.31 ft/sec, indicative of household ambulator General Gait Details: min A for steadying with using RW to walk to recliner         Balance Overall balance assessment: Needs assistance Sitting-balance support: Bilateral upper extremity supported Sitting balance-Leahy Scale: Fair Sitting balance - Comments: initially requires modA to maintain seated balance with sitting duration able to place UE in lap and maintain balance.    Standing balance support: Bilateral upper extremity supported;During functional activity Standing balance-Leahy Scale: Poor Standing balance comment: reliant on UE support on RW and additional support from therapists                            Cognition Arousal/Alertness: Awake/alert Behavior During Therapy: WFL for tasks assessed/performed Overall Cognitive Status: Within Functional Limits for tasks assessed                                        Exercises General Exercises - Lower Extremity Hip Flexion/Marching: AROM;Both;10 reps;Standing    General Comments General comments (skin integrity, edema, etc.): Pt on 12 L O2 via [place], at rest SaO2 98%O2, with ambulation to chair dropped to 89%O2, pt cued in purse lip breathing and SaO2 rebounded to 94%O2.       Pertinent Vitals/Pain Pain Assessment: Faces Faces Pain Scale: Hurts a little bit Pain Location: neck  and L shoulder Pain Descriptors / Indicators: Grimacing;Guarding Pain Intervention(s): Limited activity within patient's tolerance;Monitored during session;Repositioned           PT Goals (current goals can now be found in the care plan section) Acute Rehab PT Goals Patient Stated Goal: to be free of pain and be able to walk PT Goal Formulation: With patient Time For Goal Achievement: 12/10/19 Potential to Achieve Goals:  Good Progress towards PT goals: Progressing toward goals    Frequency    Min 3X/week      PT Plan Current plan remains appropriate       AM-PAC PT "6 Clicks" Mobility   Outcome Measure  Help needed turning from your back to your side while in a flat bed without using bedrails?: A Little Help needed moving from lying on your back to sitting on the side of a flat bed without using bedrails?: A Little Help needed moving to and from a bed to a chair (including a wheelchair)?: A Little Help needed standing up from a chair using your arms (e.g., wheelchair or bedside chair)?: A Little Help needed to walk in hospital room?: A Little Help needed climbing 3-5 steps with a railing? : Total 6 Click Score: 16    End of Session Equipment Utilized During Treatment: Oxygen;Gait belt Activity Tolerance: Treatment limited secondary to medical complications (Comment) (oxygen desaturation ) Patient left: with call bell/phone within reach;in bed Nurse Communication: Mobility status PT Visit Diagnosis: Unsteadiness on feet (R26.81);Other abnormalities of gait and mobility (R26.89);Muscle weakness (generalized) (M62.81)     Time: 6578-4696 PT Time Calculation (min) (ACUTE ONLY): 27 min  Charges:  $Therapeutic Exercise: 8-22 mins $Therapeutic Activity: 8-22 mins                     Kevin [name] B. Kevin [name] PT, DPT Acute Rehabilitation Services Pager [phone] Office [phone]    Kevin [name] 12/02/2019, 12:44 PM

## 2019-12-02 NOTE — Progress Notes (Signed)
PROGRESS NOTE  Kevin [name]. 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 intake, vague abdominal and chest pain, diarrhea, and decreased appetite.    In the emergency room he was noted to be in septic shock and was started on vasopressor.  Transferred to the ICU.  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  ID was consulted and followed, recommended IV penicillin x8 weeks.  Neurosurgery was also consulted and recommended to repeat an MRI in about 2 weeks.   Transferred to [facility] on 11/30/2019.  12/02/19: Seen and examined at his bedside this morning.  On high flow nasal cannula at 15 L.  Reports neck pain and bilateral shoulder pain 2 out of 10, nonradiating.    Assessment/Plan: Active Problems:   Hypotension   Pressure injury of skin  Acute metabolic encephalopathy secondary to hypercarbia  Presented with hypoxia and hypercarbia on ABG Mentation has improved with nightly BiPAP Reorient as needed  Septic shock, resolved, secondary to cervical ventral epidural abscess with strep intermedius on blood culture Presented severely hypotensive requiring vasopressors and admitted to ICU, initially received broad IV antibiotics Currently on IV penicillin as recommended by infectious disease Afebrile with no leukocytosis Repeat blood cultures done on 11/24/2019 - final.  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 Management as stated above with BiPAP nightly Ongoing diuresing with IV Lasix 20 mg 3 times daily Closely monitor BP while on diuretics, blood pressure has been soft\ Continue bronchodilators Continue incentive spirometer and flutter valve Maintain O2 saturation greater than 90%, wean off oxygen supplementation as tolerated  Strep intermedius bacteremia Management as stated above - TEE  8/30-- no evidence of endocarditis, PM wire infection  Continue penicillin G as recommended by ID  Dm2 with hyperglycemia Hemoglobin A1c 7.1 on 11/22/2019 Continue insulin sliding scale               Severe morbid obesity               BMI 41                Recommend weight loss outpatient with regular physical activity and healthy dieting.   Physical debility PT recommended CIR Out of bed to chair with assistance Fall precautions  Stage 2 sacral pressure ulcer, POA Continue local wound care    Code Status: Full code  Family Communication: None at bedside   Consultants:  Admitted by PCCM   Antimicrobials:  Penicillin G  DVT prophylaxis: Subcu heparin 3 times daily  Disposition:  Status is: Inpatient    Dispo: The patient is from: Home.              Anticipated d/c is to: Home.              Anticipated  d/c date is: 12/04/2019              Patient currently not stable for discharge due to ongoing management of acute hypoxic hypercarbic respiratory failure.      Objective: Vitals:   12/02/19 0841 12/02/19 1159 12/02/19 1254 12/02/19 1302  BP:  109/73    Pulse:  73    Resp:  14    Temp:  98.2 F (36.8 C)    TempSrc:  Oral    SpO2: 90% 96% 99% 98%  Weight:      Height:        Intake/Output Summary (Last 24  hours) at 12/02/2019 1414 Last data filed at 12/02/2019 0900 Gross per 24 hour  Intake 752.44 ml  Output 900 ml  Net -147.56 ml   Filed Weights   11/30/19 0625 12/01/19 0402 12/02/19 0505  Weight: 131.7 kg 128.8 kg 127.6 kg    Exam:  . General: 69 y.o. year-old male obese in no acute distress.  Alert and oriented x3. . Cardiovascular: Regular rate and rhythm no rubs or gallops. Marland Kitchen Respiratory: Diffuse wheezing bilaterally with mild rales at bases.   . Abdomen: Obese nontender normal bowel sounds present.   . Musculoskeletal: No lower extremity edema bilaterally. Marland Kitchen Psychiatry: Mood is appropriate for condition and setting.  Data Reviewed: CBC: Recent Labs  Lab 11/27/19 0347 11/28/19 0715 11/29/19 0605 11/30/19 0502 12/01/19 0507  WBC 15.6* 15.9* 11.4* 9.2 8.6  NEUTROABS 11.6* 12.7* 9.0* 7.0 6.4  HGB 11.4* 10.5* 10.4* 9.8* 9.9*  HCT 35.9* 34.0* 32.8* 31.7* 31.8*  MCV 93.0 93.4 94.5 94.1 95.8  PLT 188 211 253 266 338   Basic Metabolic Panel: Recent Labs  Lab 11/26/19 0344 11/27/19 0347 11/29/19 0855 11/30/19 0502 12/02/19 0645  NA 148* 143 138 137 136  K 3.5 3.8 4.0 4.0 4.3  CL 100 99 97* 97* 95*  CO2 37* 34* 31 33* 36*  GLUCOSE 168* 148* 142* 146* 136*  BUN 34* 27* 19 18 12   CREATININE 1.02 0.85 0.84 0.79 0.70  CALCIUM 9.5 9.2 8.9 8.9 9.0  MG 1.7  --   --   --   --   PHOS 2.6  --   --   --   --    GFR: Estimated Creatinine Clearance: 113.6 mL/min (by C-G formula based on SCr of 0.7 mg/dL). Liver Function Tests: Recent Labs  Lab 11/26/19 0344 11/27/19 0347  AST 30 28  ALT 45* 40  ALKPHOS 207* 218*  BILITOT 1.1 1.0  PROT 5.9* 6.1*  ALBUMIN 1.7* 1.8*   No results for input(s): LIPASE, AMYLASE in the last 168 hours. No results for input(s): AMMONIA in the last 168 hours. Coagulation Profile: No results for input(s): INR, PROTIME in the last 168 hours. Cardiac Enzymes: No results for input(s): CKTOTAL, CKMB, CKMBINDEX, TROPONINI in the last 168  hours. BNP (last 3 results) No results for input(s): PROBNP in the last 8760 hours. HbA1C: No results for input(s): HGBA1C in the last 72 hours. CBG: Recent Labs  Lab 12/01/19 1242 12/01/19 1657 12/01/19 2305 12/02/19 0811 12/02/19 1154  GLUCAP 179* 114* 151* 133* 132*   Lipid Profile: No results for input(s): CHOL, HDL, LDLCALC, TRIG, CHOLHDL, LDLDIRECT in the last 72 hours. Thyroid Function Tests: No results for input(s): TSH, T4TOTAL, FREET4, T3FREE, THYROIDAB in the last 72 hours. Anemia Panel: No results for input(s): VITAMINB12, FOLATE, FERRITIN, TIBC, IRON, RETICCTPCT in the last 72 hours. Urine analysis:    Component Value  Date/Time   COLORURINE YELLOW 11/22/2019 1722   APPEARANCEUR CLEAR 11/22/2019 1722   LABSPEC 1.030 11/22/2019 1722   PHURINE 5.0 11/22/2019 1722   GLUCOSEU NEGATIVE 11/22/2019 1722   HGBUR NEGATIVE 11/22/2019 1722   BILIRUBINUR NEGATIVE 11/22/2019 1722   KETONESUR NEGATIVE 11/22/2019 1722   PROTEINUR NEGATIVE 11/22/2019 1722   NITRITE NEGATIVE 11/22/2019 1722   LEUKOCYTESUR SMALL (A) 11/22/2019 1722   Sepsis Labs: @LABRCNTIP (procalcitonin:4,lacticidven:4)  ) Recent Results (from the past 240 hour(s))  Blood culture (routine single)     Status: Abnormal   Collection Time: 11/22/19  3:07 PM   Specimen: BLOOD RIGHT HAND  Result Value Ref Range Status   Specimen Description BLOOD RIGHT HAND  Final   Special Requests   Final    BOTTLES DRAWN AEROBIC AND ANAEROBIC Blood Culture adequate volume   Culture  Setup Time   Final    GRAM POSITIVE COCCI IN BOTH AEROBIC AND ANAEROBIC BOTTLES CRITICAL VALUE NOTED.  VALUE IS CONSISTENT WITH PREVIOUSLY REPORTED AND CALLED VALUE.    Culture (A)  Final    STREPTOCOCCUS INTERMEDIUS SUSCEPTIBILITIES PERFORMED ON PREVIOUS CULTURE WITHIN THE LAST 5 DAYS. Performed at [facility], [address] [place], [place] [address]    Report Status 11/25/2019 FINAL  Final  SARS Coronavirus 2 by RT PCR  (hospital order, performed in [facility] lab) Nasopharyngeal Nasopharyngeal Swab     Status: None   Collection Time: 11/22/19  3:36 PM   Specimen: Nasopharyngeal Swab  Result Value Ref Range Status   SARS Coronavirus 2 NEGATIVE NEGATIVE Final    Comment: (NOTE) SARS-CoV-2 target nucleic acids are NOT DETECTED.  The SARS-CoV-2 RNA is generally detectable in upper and lower respiratory specimens during the acute phase of infection. The lowest concentration of SARS-CoV-2 viral copies this assay can detect is 250 copies / mL. A negative result does not preclude SARS-CoV-2 infection and should not be used as the sole basis for treatment or other patient management decisions.  A negative result may occur with improper specimen collection / handling, submission of specimen other than nasopharyngeal swab, presence of viral mutation(s) within the areas targeted by this assay, and inadequate number of viral copies (<250 copies / mL). A negative result must be combined with clinical observations, patient history, and epidemiological information.  Fact Sheet for Patients:   BoilerBrush.com.cyhttps://www.fda.gov/media/136312/download  Fact Sheet for Healthcare Providers: https://pope.com/https://www.fda.gov/media/136313/download  This test is not yet approved or  cleared by the [place] FDA and has been authorized for detection and/or diagnosis of SARS-CoV-2 by FDA under an Emergency Use Authorization (EUA).  This EUA will remain in effect (meaning this test can be used) for the duration of the COVID-19 declaration under Section 564(b)(1) of the Act, 21 U.S.C. section 360bbb-3(b)(1), unless the authorization is terminated or revoked sooner.  Performed at [facility], [address] [place], [place] [address]   Blood culture (routine x 2)     Status: Abnormal   Collection Time: 11/22/19  4:35 PM   Specimen: BLOOD  Result Value Ref Range Status   Specimen Description BLOOD RIGHT ANTECUBITAL  Final    Special Requests   Final    BOTTLES DRAWN AEROBIC AND ANAEROBIC Blood Culture results may not be optimal due to an inadequate volume of blood received in culture bottles   Culture  Setup Time   Final    GRAM POSITIVE COCCI ANAEROBIC BOTTLE ONLY Organism ID to follow CRITICAL RESULT CALLED TO, READ BACK BY AND  VERIFIED WITH: Arnaldo Natal PharmD 16:30 11/23/19 ([email or web address]) Performed at [facility], [address], [place], [place] [address]    Culture STREPTOCOCCUS INTERMEDIUS (A)  Final   Report Status 11/25/2019 FINAL  Final   Organism ID, Bacteria STREPTOCOCCUS INTERMEDIUS  Final      Susceptibility   Streptococcus intermedius - MIC*    PENICILLIN <=0.06 SENSITIVE Sensitive     CEFTRIAXONE <=0.12 SENSITIVE Sensitive     ERYTHROMYCIN >=8 RESISTANT Resistant     LEVOFLOXACIN 0.5 SENSITIVE Sensitive     VANCOMYCIN 0.5 SENSITIVE Sensitive     * STREPTOCOCCUS INTERMEDIUS  Blood Culture ID Panel (Reflexed)     Status: Abnormal   Collection Time: 11/22/19  4:35 PM  Result Value Ref Range Status   Enterococcus faecalis NOT DETECTED NOT DETECTED Final   Enterococcus Faecium NOT DETECTED NOT DETECTED Final   Listeria monocytogenes NOT DETECTED NOT DETECTED Final   Staphylococcus species NOT DETECTED NOT DETECTED Final   Staphylococcus aureus (BCID) NOT DETECTED NOT DETECTED Final   Staphylococcus epidermidis NOT DETECTED NOT DETECTED Final   Staphylococcus lugdunensis NOT DETECTED NOT DETECTED Final   Streptococcus species DETECTED (A) NOT DETECTED Final    Comment: Not Enterococcus species, Streptococcus agalactiae, Streptococcus pyogenes, or Streptococcus pneumoniae. CRITICAL RESULT CALLED TO, READ BACK BY AND VERIFIED WITH: Arnaldo Natal PharmD 16:30 11/23/19 ([email or web address])    Streptococcus agalactiae NOT DETECTED NOT DETECTED Final   Streptococcus pneumoniae NOT DETECTED NOT DETECTED Final   Streptococcus pyogenes NOT DETECTED NOT DETECTED Final   A.calcoaceticus-baumannii NOT DETECTED NOT  DETECTED Final   Bacteroides fragilis NOT DETECTED NOT DETECTED Final   Enterobacterales NOT DETECTED NOT DETECTED Final   Enterobacter cloacae complex NOT DETECTED NOT DETECTED Final   Escherichia coli NOT DETECTED NOT DETECTED Final   Klebsiella aerogenes NOT DETECTED NOT DETECTED Final   Klebsiella oxytoca NOT DETECTED NOT DETECTED Final   Klebsiella pneumoniae NOT DETECTED NOT DETECTED Final   Proteus species NOT DETECTED NOT DETECTED Final   Salmonella species NOT DETECTED NOT DETECTED Final   Serratia marcescens NOT DETECTED NOT DETECTED Final   Haemophilus influenzae NOT DETECTED NOT DETECTED Final   Neisseria meningitidis NOT DETECTED NOT DETECTED Final   Pseudomonas aeruginosa NOT DETECTED NOT DETECTED Final   Stenotrophomonas maltophilia NOT DETECTED NOT DETECTED Final   Candida albicans NOT DETECTED NOT DETECTED Final   Candida auris NOT DETECTED NOT DETECTED Final   Candida glabrata NOT DETECTED NOT DETECTED Final   Candida krusei NOT DETECTED NOT DETECTED Final   Candida parapsilosis NOT DETECTED NOT DETECTED Final   Candida tropicalis NOT DETECTED NOT DETECTED Final   Cryptococcus neoformans/gattii NOT DETECTED NOT DETECTED Final    Comment: Performed at [facility], [address], [place], [place] [address]  Urine culture     Status: Abnormal   Collection Time: 11/22/19  5:17 PM   Specimen: In/Out Cath Urine  Result Value Ref Range Status   Specimen Description IN/OUT CATH URINE  Final   Special Requests   Final    NONE Performed at [facility], [address], [place], [place] [address]    Culture MULTIPLE SPECIES PRESENT, SUGGEST RECOLLECTION (A)  Final   Report Status 11/23/2019 FINAL  Final  MRSA PCR Screening     Status: None   Collection Time: 11/23/19  7:50 PM   Specimen: Nasal Mucosa; Nasopharyngeal  Result Value Ref Range Status   MRSA by PCR NEGATIVE NEGATIVE Final  Comment:        The GeneXpert MRSA Assay (FDA approved for NASAL  specimens only), is one component of a comprehensive MRSA colonization surveillance program. It is not intended to diagnose MRSA infection nor to guide or monitor treatment for MRSA infections. Performed at [facility], [address] [place], [place] [address]   Culture, blood (routine x 2)     Status: None   Collection Time: 11/24/19  4:12 PM   Specimen: BLOOD  Result Value Ref Range Status   Specimen Description BLOOD LEFT ANTECUBITAL  Final   Special Requests   Final    BOTTLES DRAWN AEROBIC AND ANAEROBIC Blood Culture adequate volume   Culture   Final    NO GROWTH 5 DAYS Performed at [facility], [address] [place], [place] [address]    Report Status 11/29/2019 FINAL  Final  Culture, blood (routine x 2)     Status: None   Collection Time: 11/24/19  4:14 PM   Specimen: BLOOD  Result Value Ref Range Status   Specimen Description BLOOD RIGHT ANTECUBITAL  Final   Special Requests   Final    BOTTLES DRAWN AEROBIC AND ANAEROBIC Blood Culture adequate volume   Culture   Final    NO GROWTH 5 DAYS Performed at [facility], [address] [place], [place] [address]    Report Status 11/29/2019 FINAL  Final      Studies: No results found.  Scheduled Meds: . arformoterol  15 mcg Nebulization BID  . budesonide (PULMICORT) nebulizer solution  0.5 mg Nebulization BID  . Chlorhexidine Gluconate Cloth  6 each Topical Daily  . furosemide  20 mg Intravenous TID  . heparin  5,000 Units Subcutaneous Q8H  . hydrOXYzine  25 mg Oral Once  . insulin aspart  0-20 Units Subcutaneous TID WC  . ipratropium-albuterol  3 mL Nebulization BID  . mouth rinse  15 mL Mouth Rinse BID  . sodium chloride flush  10-40 mL Intracatheter Q12H    Continuous Infusions: . sodium chloride Stopped (11/23/19 1428)  . penicillin g continuous IV infusion 12 Million Units (12/02/19 6270)     LOS: 10 days     Darlin Drop, MD [facility] Pager [phone]  If  7PM-7AM, please contact night-coverage [email or web address] Password [email or web address] 12/02/2019, 2:14 PM

## 2019-12-02 NOTE — Progress Notes (Signed)
Patient refused BIPAP for the evening. RT will continue to monitor.

## 2019-12-03 LAB — BASIC METABOLIC PANEL
Anion gap: 7 (ref 5–15)
BUN: 10 mg/dL (ref 8–23)
CO2: 35 mmol/L — ABNORMAL HIGH (ref 22–32)
Calcium: 8.8 mg/dL — ABNORMAL LOW (ref 8.9–10.3)
Chloride: 89 mmol/L — ABNORMAL LOW (ref 98–111)
Creatinine, Ser: 0.86 mg/dL (ref 0.61–1.24)
GFR calc Af Amer: >60 mL/min (ref >60)
GFR calc non Af Amer: >60 mL/min (ref >60)
Glucose, Bld: 270 mg/dL — ABNORMAL HIGH (ref 70–99)
Potassium: 5.4 mmol/L — ABNORMAL HIGH (ref 3.5–5.1)
Sodium: 131 mmol/L — ABNORMAL LOW (ref 135–145)

## 2019-12-03 LAB — GLUCOSE, CAPILLARY
Glucose-Capillary: 150 mg/dL — ABNORMAL HIGH (ref 70–99)
Glucose-Capillary: 142 mg/dL — ABNORMAL HIGH (ref 70–99)
Glucose-Capillary: 135 mg/dL — ABNORMAL HIGH (ref 70–99)
Glucose-Capillary: 168 mg/dL — ABNORMAL HIGH (ref 70–99)

## 2019-12-03 MED ORDER — DIPHENHYDRAMINE HCL 25 MG PO CAPS: 25.0000 mg | ORAL_CAPSULE | Freq: Three times a day (TID) | ORAL | Status: DC | PRN

## 2019-12-03 MED ORDER — PREDNISONE 20 MG PO TABS
40.0000 mg | ORAL_TABLET | Freq: Every day | ORAL | Status: DC
  Administered 2019-12-03 – 2019-12-05 (×3): 40 mg via ORAL
  Filled 2019-12-03 (×2): qty 2

## 2019-12-03 MED ORDER — TRAMADOL HCL 50 MG PO TABS: 50.0000 mg | ORAL_TABLET | Freq: Once | ORAL | Status: DC | PRN

## 2019-12-03 MED ORDER — IPRATROPIUM-ALBUTEROL 0.5-2.5 (3) MG/3ML IN SOLN
3.0000 mL | Freq: Two times a day (BID) | RESPIRATORY_TRACT | Status: DC
  Administered 2019-12-03 – 2019-12-13 (×19): 3 mL via RESPIRATORY_TRACT
  Filled 2019-12-03 (×19): qty 3

## 2019-12-03 MED ORDER — GABAPENTIN 100 MG PO CAPS
100.0000 mg | ORAL_CAPSULE | Freq: Three times a day (TID) | ORAL | Status: DC
  Administered 2019-12-03 – 2019-12-13 (×31): 100 mg via ORAL
  Filled 2019-12-03 (×31): qty 1

## 2019-12-03 MED ORDER — PHENOL 1.4 % MT LIQD
1.0000 | OROMUCOSAL | Status: DC | PRN
  Administered 2019-12-03: 1 via OROMUCOSAL
  Filled 2019-12-03: qty 177

## 2019-12-03 MED ORDER — FUROSEMIDE 10 MG/ML IJ SOLN
40.0000 mg | Freq: Two times a day (BID) | INTRAMUSCULAR | Status: DC
  Administered 2019-12-03: 40 mg via INTRAVENOUS
  Filled 2019-12-03: qty 4

## 2019-12-03 MED ORDER — MELATONIN 3 MG PO TABS
3.0000 mg | ORAL_TABLET | Freq: Every day | ORAL | Status: DC
  Administered 2019-12-03 – 2019-12-12 (×10): 3 mg via ORAL
  Filled 2019-12-03 (×10): qty 1

## 2019-12-03 NOTE — Progress Notes (Addendum)
PROGRESS NOTE  Kevin L Sutliff Jr. 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 intake, vague abdominal and chest pain, diarrhea, and decreased appetite.    In the emergency room he was noted to be in septic shock and was started on vasopressor.  Transferred to the ICU.  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  ID was consulted and followed, recommended IV penicillin x8 weeks.  Neurosurgery was also consulted and recommended to repeat an MRI in about 2 weeks.   Transferred to [facility] on 11/30/2019.  Hospital course complicated by acute hypoxic respiratory failure secondary to pulmonary edema with increased oxygen requirement up to 15 L high flow nasal cannula.  Ongoing diuresing, IV Lasix increased to 40 mg twice daily.  Judicious diuresis due to soft blood pressures.  12/03/19: Seen and examined at bedside.  Alert and oriented x3.  Weak appearing.  Denies chest pain.  Reports sharp neck pain, starting gabapentin  Assessment/Plan: Active Problems:   Hypotension   Pressure injury of skin  Resolved Acute metabolic encephalopathy secondary to hypercarbia  Presented with hypoxia and hypercarbia on ABG Mentation has improved with nightly BiPAP, encouraged to comply, he was receptive.  Septic shock, resolved, secondary to cervical ventral epidural abscess with strep intermedius on blood culture Presented severely hypotensive requiring vasopressors and admitted to ICU, initially received broad IV antibiotics Currently on IV penicillin as recommended by infectious disease Afebrile with no leukocytosis Repeat blood cultures done on 11/24/2019 - final.  Cervical  ventral epidural abscess with strep intermedius on blood culture Seen by infectious disease and neurosurgery Plan is to continue IV penicillin x8 weeks and to repeat an MRI in about 2 weeks Continue pain control, started gabapentin 100 mg 3 times daily.  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 Management as stated above with BiPAP nightly Ongoing diuresing with IV Lasix dose increased to 40 mg twice daily Closely monitor BP while on diuretics, blood pressure has been soft\ Continue bronchodilators Add prednisone 40 mg daily x5 days. Continue incentive spirometer and flutter valve Maintain O2 saturation greater than 90%, wean off oxygen supplementation as tolerated  Strep intermedius bacteremia Management as stated above - TEE  8/30-- no evidence of endocarditis, PM wire infection  Continue penicillin G as recommended by ID  Dm2 with hyperglycemia Hemoglobin A1c 7.1 on 11/22/2019 Continue insulin sliding scale               Severe morbid obesity               BMI 41                Recommend weight loss outpatient with regular physical activity and healthy dieting.  Situational insomnia Start melatonin nightly   Physical debility PT recommended CIR Out of bed to chair with assistance Fall precautions  Stage 2 sacral pressure ulcer, POA Continue local wound care    Code Status: Full code  Family Communication: None at bedside   Consultants:  Admitted by PCCM   Antimicrobials:  Penicillin G  DVT prophylaxis: Subcu heparin 3 times daily  Disposition:  Status is: Inpatient    Dispo: The patient is from: Home.              Anticipated d/c is to:  CIR              Anticipated d/c date is: 12/04/2019              Patient currently not stable for discharge due to ongoing management of acute hypoxic hypercarbic respiratory failure.      Objective: Vitals:   12/03/19 0600 12/03/19 0743 12/03/19 0837 12/03/19 1300  BP: 128/70 132/68    Pulse: 78 79  80  Resp: 20 18  (!) 24  Temp: 98 F (36.7 C) 98.4 F (36.9 C)    TempSrc: Oral Oral    SpO2: 95% 92% 90% (!) 88%  Weight: 127.6 kg     Height:        Intake/Output Summary (Last 24 hours) at 12/03/2019 1514 Last data filed at 12/03/2019 1300 Gross per 24 hour  Intake 1532.7 ml  Output 3450 ml  Net -1917.3 ml   Filed Weights   12/01/19 0402 12/02/19 0505 12/03/19 0600  Weight: 128.8 kg 127.6 kg 127.6 kg    Exam:  . General: 69 y.o. year-old male obese in no acute distress.  Alert and oriented x3.  . Cardiovascular: Regular rate and rhythm no rubs or gallops.   Marland Kitchen Respiratory: Diffuse wheezing bilaterally with mild rales at bases.   . Abdomen: Obese nontender normal bowel sounds present. . Musculoskeletal: Trace lower extremity edema bilaterally.   Marland Kitchen Psychiatry: Mood is appropriate for condition and setting.   Data Reviewed: CBC: Recent Labs  Lab 11/27/19 0347 11/28/19 0715 11/29/19 0605 11/30/19 0502 12/01/19 0507  WBC 15.6* 15.9* 11.4* 9.2 8.6  NEUTROABS 11.6* 12.7* 9.0* 7.0 6.4  HGB 11.4* 10.5* 10.4* 9.8* 9.9*  HCT 35.9* 34.0* 32.8* 31.7* 31.8*  MCV 93.0 93.4 94.5 94.1 95.8  PLT 188 211 253 266 338   Basic Metabolic Panel: Recent Labs  Lab 11/27/19 0347 11/29/19 0855 11/30/19 0502 12/02/19 0645 12/03/19 0609  NA 143 138 137 136 131*  K 3.8 4.0 4.0 4.3 5.4*  CL 99 97* 97* 95* 89*  CO2 34* 31 33* 36* 35*  GLUCOSE 148* 142* 146* 136* 270*  BUN 27* 19 18 12 10   CREATININE 0.85 0.84 0.79 0.70 0.86  CALCIUM 9.2 8.9 8.9 9.0 8.8*   GFR: Estimated Creatinine Clearance: 105.6 mL/min (by C-G formula based on SCr of 0.86 mg/dL). Liver Function  Tests: Recent Labs  Lab 11/27/19 0347  AST 28  ALT 40  ALKPHOS 218*  BILITOT 1.0  PROT 6.1*  ALBUMIN 1.8*   No results for input(s): LIPASE, AMYLASE in the last 168 hours. No results for input(s): AMMONIA in the last 168 hours. Coagulation Profile: No results for input(s): INR, PROTIME in the last 168 hours. Cardiac Enzymes: No results for input(s): CKTOTAL, CKMB, CKMBINDEX, TROPONINI in the last 168 hours. BNP (last 3 results) No results for input(s): PROBNP in the last 8760 hours. HbA1C: No results for input(s): HGBA1C in the last 72 hours. CBG: Recent Labs  Lab 12/02/19 1154 12/02/19 1608 12/02/19 2036 12/03/19 0742 12/03/19 1120  GLUCAP 132* 126* 173* 150* 142*   Lipid Profile: No results for input(s): CHOL, HDL, LDLCALC, TRIG, CHOLHDL, LDLDIRECT in the last 72 hours. Thyroid Function Tests: No results  for input(s): TSH, T4TOTAL, FREET4, T3FREE, THYROIDAB in the last 72 hours. Anemia Panel: No results for input(s): VITAMINB12, FOLATE, FERRITIN, TIBC, IRON, RETICCTPCT in the last 72 hours. Urine analysis:    Component Value Date/Time   COLORURINE YELLOW 11/22/2019 1722   APPEARANCEUR CLEAR 11/22/2019 1722   LABSPEC 1.030 11/22/2019 1722   PHURINE 5.0 11/22/2019 1722   GLUCOSEU NEGATIVE 11/22/2019 1722   HGBUR NEGATIVE 11/22/2019 1722   BILIRUBINUR NEGATIVE 11/22/2019 1722   KETONESUR NEGATIVE 11/22/2019 1722   PROTEINUR NEGATIVE 11/22/2019 1722   NITRITE NEGATIVE 11/22/2019 1722   LEUKOCYTESUR SMALL (A) 11/22/2019 1722   Sepsis Labs: @LABRCNTIP (procalcitonin:4,lacticidven:4)  ) Recent Results (from the past 240 hour(s))  MRSA PCR Screening     Status: None   Collection Time: 11/23/19  7:50 PM   Specimen: Nasal Mucosa; Nasopharyngeal  Result Value Ref Range Status   MRSA by PCR NEGATIVE NEGATIVE Final    Comment:        The GeneXpert MRSA Assay (FDA approved for NASAL specimens only), is one component of a comprehensive MRSA  colonization surveillance program. It is not intended to diagnose MRSA infection nor to guide or monitor treatment for MRSA infections. Performed at [facility], [address] [place], [place]   Culture, blood (routine x 2)     Status: None   Collection Time: 11/24/19  4:12 PM   Specimen: BLOOD  Result Value Ref Range Status   Specimen Description BLOOD LEFT ANTECUBITAL  Final   Special Requests   Final    BOTTLES DRAWN AEROBIC AND ANAEROBIC Blood Culture adequate volume   Culture   Final    NO GROWTH 5 DAYS Performed at [facility], [address], [place], [place]    Report Status 11/29/2019 FINAL  Final  Culture, blood (routine x 2)     Status: None   Collection Time: 11/24/19  4:14 PM   Specimen: BLOOD  Result Value Ref Range Status   Specimen Description BLOOD RIGHT ANTECUBITAL  Final   Special Requests   Final    BOTTLES DRAWN AEROBIC AND ANAEROBIC Blood Culture adequate volume   Culture   Final    NO GROWTH 5 DAYS Performed at [facility], [address], [place], [place]    Report Status 11/29/2019 FINAL  Final      Studies: No results found.  Scheduled Meds: . arformoterol  15 mcg Nebulization BID  . budesonide (PULMICORT) nebulizer solution  0.5 mg Nebulization BID  . Chlorhexidine Gluconate Cloth  6 each Topical Daily  . furosemide  20 mg Intravenous TID  . heparin  5,000 Units Subcutaneous Q8H  . hydrOXYzine  25 mg Oral Once  . insulin aspart  0-20 Units Subcutaneous TID WC  . ipratropium-albuterol  3 mL Nebulization BID  . mouth rinse  15 mL Mouth Rinse BID  . sodium chloride flush  10-40 mL Intracatheter Q12H    Continuous Infusions: . sodium chloride Stopped (11/23/19 1428)  . penicillin g continuous IV infusion 12 Million Units (12/03/19 02/02/20)     LOS: 11 days     1740, MD [facility] Pager [phone]  If 7PM-7AM, please contact night-coverage [email or web address] Password  [facility] 12/03/2019, 3:14 PM

## 2019-12-03 NOTE — Progress Notes (Signed)
   12/03/19 0242  Assess: MEWS Score  Pulse Rate 72  Resp (!) 29  SpO2 94 %  O2 Device HFNC  O2 Flow Rate (L/min) 7 L/min  Assess: MEWS Score  MEWS Temp 0  MEWS Systolic 0  MEWS Pulse 0  MEWS RR 2  MEWS LOC 0  MEWS Score 2  MEWS Score Color Yellow  Assess: if the MEWS score is Yellow or Red  Were vital signs taken at a resting state? Yes  Focused Assessment No change from prior assessment  Early Detection of Sepsis Score *See Row Information* Low  Treat  MEWS Interventions Escalated (See documentation below)  Take Vital Signs  Increase Vital Sign Frequency  Yellow: Q 2hr X 2 then Q 4hr X 2, if remains yellow, continue Q 4hrs  Escalate  MEWS: Escalate Yellow: discuss with charge nurse/RN and consider discussing with provider and RRT  Notify: Charge Nurse/RN  Name of Charge Nurse/RN Notified [place], RN  Date Charge Nurse/RN Notified 12/03/19  Time Charge Nurse/RN Notified 0304  Notify: Provider  Provider Name/Title Carren Rang, MD      Date Provider Notified 12/03/19  Time Provider Notified [phone]  Notification Type Page  Notification Reason Change in status  Response Other (Comment) (waiting for orders. )

## 2019-12-04 ENCOUNTER — Inpatient Hospital Stay (HOSPITAL_COMMUNITY): Payer: Medicare Other

## 2019-12-04 DIAGNOSIS — Z7189 Other specified counseling: Secondary | ICD-10-CM

## 2019-12-04 DIAGNOSIS — Z515 Encounter for palliative care: Secondary | ICD-10-CM

## 2019-12-04 LAB — BLOOD GAS, ARTERIAL
Acid-Base Excess: 12.3 mmol/L — ABNORMAL HIGH (ref 0.0–2.0)
Acid-Base Excess: 9.9 mmol/L — ABNORMAL HIGH (ref 0.0–2.0)
Bicarbonate: 38.1 mmol/L — ABNORMAL HIGH (ref 20.0–28.0)
Bicarbonate: 34.4 mmol/L — ABNORMAL HIGH (ref 20.0–28.0)
Drawn by: 21338
FIO2: 80
FIO2: 80
O2 Saturation: 94.1 %
O2 Saturation: 97.2 %
Patient temperature: 36.6
Patient temperature: 36.4
pCO2 arterial: 66.9 mmHg (ref 32.0–48.0)
pCO2 arterial: 49.6 mmHg — ABNORMAL HIGH (ref 32.0–48.0)
pH, Arterial: 7.371 (ref 7.350–7.450)
pH, Arterial: 7.452 — ABNORMAL HIGH (ref 7.350–7.450)
pO2, Arterial: 71 mmHg — ABNORMAL LOW (ref 83.0–108.0)
pO2, Arterial: 92.3 mmHg (ref 83.0–108.0)

## 2019-12-04 LAB — BASIC METABOLIC PANEL
Anion gap: 5 (ref 5–15)
BUN: 12 mg/dL (ref 8–23)
CO2: 37 mmol/L — ABNORMAL HIGH (ref 22–32)
Calcium: 9.3 mg/dL (ref 8.9–10.3)
Chloride: 94 mmol/L — ABNORMAL LOW (ref 98–111)
Creatinine, Ser: 0.75 mg/dL (ref 0.61–1.24)
GFR calc Af Amer: >60 mL/min (ref >60)
GFR calc non Af Amer: >60 mL/min (ref >60)
Glucose, Bld: 178 mg/dL — ABNORMAL HIGH (ref 70–99)
Potassium: 4.6 mmol/L (ref 3.5–5.1)
Sodium: 136 mmol/L (ref 135–145)

## 2019-12-04 LAB — GLUCOSE, CAPILLARY
Glucose-Capillary: 151 mg/dL — ABNORMAL HIGH (ref 70–99)
Glucose-Capillary: 181 mg/dL — ABNORMAL HIGH (ref 70–99)
Glucose-Capillary: 174 mg/dL — ABNORMAL HIGH (ref 70–99)
Glucose-Capillary: 165 mg/dL — ABNORMAL HIGH (ref 70–99)
Glucose-Capillary: 216 mg/dL — ABNORMAL HIGH (ref 70–99)
Glucose-Capillary: 120 mg/dL — ABNORMAL HIGH (ref 70–99)

## 2019-12-04 MED ORDER — IOHEXOL 350 MG/ML SOLN
75.0000 mL | Freq: Once | INTRAVENOUS | Status: AC | PRN
  Administered 2019-12-04: 75 mL via INTRAVENOUS

## 2019-12-04 NOTE — Consult Note (Signed)
Palliative Medicine Inpatient Consult Note  Reason for consult:  Goals of Care  HPI:  Per intake H&P --> Patient is a 69 year old male with essential hypertension, morbid obesity, type II diabetes mellitus, asthma, hypercholesterolemia, Mobitz type II AV block S/p pacemaker placement &OSA  presented with a 1 week history of worsening p.o. intake, vague abdominal and chest pain, diarrhea, and decreased appetite  Palliative care was asked to help address goals of care in the setting of increasing O2 requirements.   Clinical Assessment/Goals of Care: I have reviewed medical records including EPIC notes, labs and imaging, received report from bedside RN, assessed the patient.    I met with Kevin [name] to further discuss diagnosis prognosis, GOC, EOL wishes, disposition and options.   I introduced Palliative Medicine as specialized medical care for people living with serious illness. It focuses on providing relief from the symptoms and stress of a serious illness. The goal is to improve quality of life for both the patient and the family.  I asked Kevin [name] to tell me about himself. He shares that he is from [place], [place] originally. He moved to [place] [facility] [facility] for his masters degree which in fine arts. He is married to his wife, Kevin [name] and they share one son together. They have been married for forty three years. 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 He is a man of faith and practices within the [employer] denomination.    I asked Kevin [name] how he was functioning at home prior to hospitalization. He shares that he was recently getting around with the aid of a cane. He lives with his wife in a split level home. He stays on the first level much of the time. He had been able to perform his bADLs prior to hospitalization.   A detailed discussion was had today regarding advanced directives - Kevin [name] has never completed  these before.  He would be interested in discussing these further with his wife.  A most form was introduced. Concepts specific to code status, artifical feeding and hydration, continued IV antibiotics and rehospitalization was had. Kevin [name] states that presently he would wish for a trial of intubation to see if he could improve though if the medical team felt he would not reach meaningful recovery he would wish for intubation to be terminated. He would at the present time want CPR - we reviewed the possible burdens of CPR which he states understanding of.  In the event that Kevin [name]'s condition continues to decline the difference between an aggressive medical intervention path and a palliative comfort care path for this patient at this time was had. Kevin [name] states that he gets great enjoyment out of living and hopes to someday set up an interview service for local artists to showcase their work. He states that his personal goals include getting off of oxygen and gaining strength at acute rehab. He hopes to go home to complete his family tree and continue on with his fine art/design business.   Discussed the importance of continued conversation with family and their  medical providers regarding overall plan of care and treatment options, ensuring decisions are within the context of the patients values and GOCs.  Provided a copy of the MOST form and a "Hard Choices for [employer]" booklet.  ________________________________________ Addendum:  I spoke to patients wife, Kevin [name] via telephone. I shared with her that Kevin [name] is in a guarded clinical condition at the present  time. We discussed the conversation Kevin [name] and I had regarding code status. Kevin [name] shares that she plans to speak with Kevin [name] this afternoon so that they can "make some decisions" as they relate to this topic.   Decision Maker: Patient can make decisions for himself.   SUMMARY OF RECOMMENDATIONS   Full Code / Full Scope of Treatment  MOST form  introduced   Spiritual Support - Advance Directives  PMT support for ongoing [place] discussions  Code Status/Advance Care Planning: FULL CODE   Palliative Prophylaxis:   Oral Care, Mobilize, Aspiration precuations  Additional Recommendations (Limitations, Scope, Preferences):  Full scope of care   Psycho-social/Spiritual:   Desire for further Chaplaincy support: Yes - [facility]  Additional Recommendations: Education on progression of disease   Prognosis: Unclear  Discharge Planning: If improving would be recommended per PT note review to go to CIR  PPS: 50%   This conversation/these recommendations were discussed with patient primary care team, Dr. Nevada Crane  Time In: 1200 Time Out: 1330 Total Time: 90 Greater than 50%  of this time was spent counseling and coordinating care related to the above assessment and plan.  Star Team Team Cell Phone: [phone] Please utilize secure chat with additional questions, if there is no response within 30 minutes please call the above phone number  Palliative Medicine Team providers are available by phone from 7am to 7pm daily and can be reached through the team cell phone.  Should this patient require assistance outside of these hours, please call the patient's attending physician.

## 2019-12-04 NOTE — Progress Notes (Signed)
NAME:  Kevin Dambach., MRN:  [id number], DOB:  1950/09/17, LOS: 12 ADMISSION DATE:  11/22/2019, CONSULTATION DATE: 11/22/2019 REFERRING MD:  Redge Gainer, ED  CHIEF COMPLAINT: Lethargy and malaise  Brief History   Patient is a 69 year old male with persistent hypoxia  History of present illness   Patient is a 69 year old male with hypertension, diabetes mellitus, asthma, hypercholesterolemia, Mobitz type II AV block S/p pacemaker placement & OSA  with a 1 week history of worsening p.o. intake, vague abdominal and chest complaints of pain, diarrhea, and decreased appetite.    Transiently on vasopressors. Improved with CPAP compliance and initiation of antibiotics.   Grew S intermedius in blood. CT abdomen negative and TEE shows no endocarditis. LV/RV are normal  MRI cervical spine shows large abscess - treating medically as no deficits.   Past Medical History   . Diabetes mellitus without complication (HCC)   . Hypertension   Untreated sleep apnea Morbid obesity Permanent pacemaker   Significant Hospital Events   Admission to [facility], ICU 11/22/2019  Consults:  PCCM  Procedures:  NA  Significant Diagnostic Tests:  CT chest negative for PE. Marked basilar atelectasis.   Micro Data:  Blood cultures planted, urinalysis unremarkable, UA pending. 825 urine culture multiple species 2 blood cultures on 825+ for strep vancomycin DC'd placed on Rocephin Antimicrobials:  Currently on Rocephin  Interim history/subjective:    Requested to see again today for hypoxia. Patient has apparently been non-compliant with IS and CPAP.  On my arrival, he is in his chair using IS without complaints. Dyspnea particularly with ambulation.   Objective   Blood pressure 116/75, pulse 79, temperature 98.9 F (37.2 C), temperature source Oral, resp. rate 19, height 5\' 10"  (1.778 m), weight 125.8 kg, SpO2 99 %.        Intake/Output Summary (Last 24 hours) at 12/04/2019 1525 Last data  filed at 12/04/2019 1233 Gross per 24 hour  Intake 1544.01 ml  Output 800 ml  Net 744.01 ml   Filed Weights   12/02/19 0505 12/03/19 0600 12/04/19 0435  Weight: 127.6 kg 127.6 kg 125.8 kg    Examination: General: Morbidly obese male who is somewhat more interactive today HEENT: No JVD or lymphadenopathy is appreciated Neuro: Awake follows commands oriented.  No focal deficits.  Good strength in all four extremities. CV: Heart sounds are distant but regular PULM: Diminished in the bases with bronchial breath sounds.  GI: soft, bsx4 active, obese GU: Foley with amber urine Extremities: warm/dry, 12+ edema  Skin: no rashes or lesions   Resolved Hospital Problem list   NA  Assessment & Plan:  Acute on chronic hypoxic respiratory insufficiency in the setting of obstructive sleep apnea with noncompliance with CPAP. Significant V/Q mismatch with basilar atelectasis, can result in severe hypoxia with fairly minimal parenchymal disease - Have emphasized importance of IS and CPAP. - May take several days to clear.   Will follow with you   Labs   CBC: Recent Labs  Lab 11/28/19 0715 11/29/19 0605 11/30/19 0502 12/01/19 0507  WBC 15.9* 11.4* 9.2 8.6  NEUTROABS 12.7* 9.0* 7.0 6.4  HGB 10.5* 10.4* 9.8* 9.9*  HCT 34.0* 32.8* 31.7* 31.8*  MCV 93.4 94.5 94.1 95.8  PLT 211 253 266 338    Basic Metabolic Panel: Recent Labs  Lab 11/29/19 0855 11/30/19 0502 12/02/19 0645 12/03/19 0609 12/04/19 0500  NA 138 137 136 131* 136  K 4.0 4.0 4.3 5.4* 4.6  CL 97* 97* 95* 89*  94*  CO2 31 33* 36* 35* 37*  GLUCOSE 142* 146* 136* 270* 178*  BUN 19 18 12 10 12   CREATININE 0.84 0.79 0.70 0.86 0.75  CALCIUM 8.9 8.9 9.0 8.8* 9.3   GFR: Estimated Creatinine Clearance: 112.7 mL/min (by C-G formula based on SCr of 0.75 mg/dL). Recent Labs  Lab 11/28/19 0715 11/29/19 0605 11/30/19 0502 12/01/19 0507  WBC 15.9* 11.4* 9.2 8.6    Liver Function Tests: No results for input(s): AST, ALT,  ALKPHOS, BILITOT, PROT, ALBUMIN in the last 168 hours. No results for input(s): LIPASE, AMYLASE in the last 168 hours. No results for input(s): AMMONIA in the last 168 hours.  ABG    Component Value Date/Time   PHART 7.371 12/04/2019 0619   PCO2ART 66.9 (HH) 12/04/2019 0619   PO2ART 71.0 (L) 12/04/2019 0619   HCO3 38.1 (H) 12/04/2019 0619   TCO2 43 (H) 11/25/2019 1741   O2SAT 94.1 12/04/2019 0619     Coagulation Profile: No results for input(s): INR, PROTIME in the last 168 hours.  Cardiac Enzymes: No results for input(s): CKTOTAL, CKMB, CKMBINDEX, TROPONINI in the last 168 hours.  HbA1C: Hgb A1c MFr Bld  Date/Time Value Ref Range Status  11/22/2019 07:55 PM 7.1 (H) 4.8 - 5.6 % Final    Comment:    (NOTE) Pre diabetes:          5.7%-6.4%  Diabetes:              >6.4%  Glycemic control for   <7.0% adults with diabetes     CBG: Recent Labs  Lab 12/03/19 1120 12/03/19 1629 12/03/19 2030 12/04/19 0728 12/04/19 1250  GLUCAP 142* 135* 168* 151* 174*    02/03/20, MD [facility] ICU Physician [facility]  Critical Care  Pager: [phone] Mobile: [phone] After hours: [phone].  12/04/2019, 3:25 PM

## 2019-12-04 NOTE — Progress Notes (Signed)
CRITICAL VALUE ALERT  Critical Value: pCO2 arterial  Date & Time Notified:  9/5 6:42  Provider Notified: 9/5 6:45  Orders Received/Actions taken: pending call from MD   Results for UNIQUE, [name] (MRN [id number]) as of 12/04/2019 06:43  Ref. Range 12/04/2019 06:19  Sample type Unknown ARTERIAL  FIO2 Unknown 80.00  pH, Arterial Latest Ref Range: 7.35 - 7.45  7.371  pCO2 arterial Latest Ref Range: 32 - 48 mmHg 66.9 (HH)  pO2, Arterial Latest Ref Range: 83 - 108 mmHg 71.0 (L)  Acid-Base Excess Latest Ref Range: 0.0 - 2.0 mmol/L 12.3 (H)  Bicarbonate Latest Ref Range: 20.0 - 28.0 mmol/L 38.1 (H)  O2 Saturation Latest Units: % 94.1  Patient temperature Unknown 36.6  Collection site Unknown LEFT RADIAL  Allens test (pass/fail) Latest Ref Range: PASS  PASS

## 2019-12-04 NOTE — Progress Notes (Signed)
Orthopedic Tech Progress Note Patient Details:  Kevin [name]. [birth year] [id number]  Ortho Devices Type of Ortho Device: Soft collar Ortho Device/Splint Interventions: Ordered, Application, Adjustment   Post Interventions Patient Tolerated: Well Instructions Provided: Care of device, Adjustment of device   Trinna Post 12/04/2019, 7:02 AM

## 2019-12-04 NOTE — Plan of Care (Signed)
Consult for PCCM received regarding worsening hypoxic and hypercarbic respiratory failure.  Progress note to follow    Tessie Fass MSN, AGACNP-BC [facility] Pulmonary/Critical Care Medicine [phone] If no answer, [phone] 12/04/2019, 12:52 PM

## 2019-12-04 NOTE — Progress Notes (Signed)
Spoke to [place], respiratory therapy about putting patient back on bipap, Lequita Halt states that patient is compensated and is not in need of bipap at this point.

## 2019-12-04 NOTE — Progress Notes (Signed)
Patient desatured to 83% while on 15L HFNC, patient placed on NRB and sats improved to 97%. Patient stated that he was a mouth breather. A 55% venti-mask was given to RN to trial on patient.   Critical PCO2 was called in from lab, results given to RN.

## 2019-12-04 NOTE — Progress Notes (Signed)
PROGRESS NOTE  Kevin L Koplin Jr. 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 intake, vague abdominal and chest pain, diarrhea, and decreased appetite.    In the emergency room he was noted to be in septic shock and was started on vasopressor.  Transferred to the ICU.  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  ID was consulted and followed, recommended IV penicillin x8 weeks.  Neurosurgery was also consulted and recommended to repeat an MRI in about 2 weeks.   Transferred to [facility] on 11/30/2019.  Hospital course complicated by acute hypoxic respiratory failure secondary to pulmonary edema with increased oxygen requirement up to 15 L high flow nasal cannula.  Ongoing diuresing, IV Lasix increased to 40 mg twice daily.  Judicious diuresis due to soft blood pressures.  Reports sharp neck pain, started on gabapentin.  12/04/19: Seen and examined at his bedside.  Hypoxic and worsening hypercarbia on ABG this morning.  Patient had not been compliant with his BiPAP.  CTA PE obtained, no evidence of pulmonary embolism.  He denies any chest pain but has persistent dyspnea with minimal exertion.  Assessment/Plan: Active Problems:   Hypotension   Pressure injury of skin  Resolved Acute metabolic encephalopathy secondary to hypercarbia  Presented with hypoxia and hypercarbia on ABG Mentation has improved with nightly BiPAP, encouraged to comply, he was receptive.  Septic shock, resolved, secondary to cervical ventral epidural abscess with strep intermedius on blood culture Presented severely hypotensive requiring vasopressors and admitted to ICU, initially received  broad IV antibiotics Currently on IV penicillin as recommended by infectious disease Afebrile with no leukocytosis Repeat blood cultures done on 11/24/2019 - final.  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 We will repeat cervical spine MRI on 12/05/2019.  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 Management as stated above with BiPAP nightly Patient has continued to be noncompliant with his BiPAP ABG done on 12/04/2019 showed 7.3/66/71 on 80% FiO2. Reviewed CTA chest no evidence of pulmonary embolism, bilateral pleural effusions R>L. IV lasix on hold due to IV contrast from CTA Continue bronchodilators Continue prednisone 40 mg daily x5 days. Continue incentive spirometer and flutter valve Continue to maintain O2 saturation greater than 90%, wean off oxygen supplementation as tolerated  Noncompliance with medical management Had a lengthy conversation on 9/4 and 9/5 with the patient regarding the importance of compliance with his BIPAP He understands but has not fully complied  Strep intermedius bacteremia Management as stated above - TEE  8/30-- no evidence of endocarditis, PM wire infection  Continue penicillin G as recommended by ID  Dm2 with hyperglycemia Hemoglobin A1c 7.1 on 11/22/2019 Continue insulin sliding scale               Severe morbid obesity  BMI 41                Recommend weight loss outpatient with regular physical  activity and healthy dieting.  Situational insomnia Start melatonin nightly   Physical debility PT recommended CIR Out of bed to chair with assistance Fall precautions  Stage 2 sacral pressure ulcer, POA Continue local wound care    Code Status: Full code  Family Communication: None at bedside   Consultants:  Admitted by PCCM   Antimicrobials:  Penicillin G  DVT prophylaxis: Subcu heparin 3 times daily  Disposition:  Status is: Inpatient    Dispo: The patient is from: Home.              Anticipated d/c is to: CIR              Anticipated d/c date is: 12/07/2019              Patient currently not stable for discharge due to ongoing management of acute hypoxic hypercarbic respiratory failure.      Objective: Vitals:   12/04/19 0011 12/04/19 0435 12/04/19 0554 12/04/19 0730  BP: 120/76 111/71  121/70  Pulse:  81  74  Resp:  20 19   Temp: 98 F (36.7 C) 98.7 F (37.1 C)  97.7 F (36.5 C)  TempSrc: Oral Oral  Oral  SpO2:  (!) 83% 90% 92%  Weight:  125.8 kg    Height:        Intake/Output Summary (Last 24 hours) at 12/04/2019 1119 Last data filed at 12/04/2019 0935 Gross per 24 hour  Intake 1124.01 ml  Output 1450 ml  Net -325.99 ml   Filed Weights   12/02/19 0505 12/03/19 0600 12/04/19 0435  Weight: 127.6 kg 127.6 kg 125.8 kg    Exam:  . General: 69 y.o. year-old male Obese in no acute distress.  Alert and oriented x3. . Cardiovascular: Regular rate and rhythm no rubs or gallops. Marland Kitchen Respiratory: Mild rales at bases no wheezing noted. . Abdomen: Obese nontender normal bowel sounds present.   . Musculoskeletal: Trace lower extremity edema bilaterally.   Marland Kitchen Psychiatry: Mood is appropriate for condition and setting.   Data Reviewed: CBC: Recent Labs  Lab 11/28/19 0715 11/29/19 0605 11/30/19 0502 12/01/19 0507  WBC 15.9* 11.4* 9.2 8.6  NEUTROABS 12.7* 9.0* 7.0 6.4  HGB 10.5* 10.4* 9.8* 9.9*  HCT 34.0* 32.8* 31.7* 31.8*  MCV 93.4 94.5  94.1 95.8  PLT 211 253 266 338   Basic Metabolic Panel: Recent Labs  Lab 11/29/19 0855 11/30/19 0502 12/02/19 0645 12/03/19 0609 12/04/19 0500  NA 138 137 136 131* 136  K 4.0 4.0 4.3 5.4* 4.6  CL 97* 97* 95* 89* 94*  CO2 31 33* 36* 35* 37*  GLUCOSE 142* 146* 136* 270* 178*  BUN 19 18 12 10 12   CREATININE 0.84 0.79 0.70 0.86 0.75  CALCIUM 8.9 8.9 9.0 8.8* 9.3   GFR: Estimated Creatinine Clearance: 112.7 mL/min (by C-G formula based on SCr of 0.75 mg/dL). Liver Function Tests: No results for input(s): AST, ALT, ALKPHOS, BILITOT, PROT, ALBUMIN in the last 168 hours. No results for input(s): LIPASE, AMYLASE in the last 168 hours. No results for input(s): AMMONIA in the last 168 hours. Coagulation Profile: No results for input(s): INR, PROTIME in the last 168 hours. Cardiac Enzymes: No results for input(s): CKTOTAL, CKMB, CKMBINDEX, TROPONINI in the last 168 hours. BNP (last 3 results) No results for input(s): PROBNP in the last 8760 hours.  HbA1C: No results for input(s): HGBA1C in the last 72 hours. CBG: Recent Labs  Lab 12/03/19 0742 12/03/19 1120 12/03/19 1629 12/03/19 2030 12/04/19 0728  GLUCAP 150* 142* 135* 168* 151*   Lipid Profile: No results for input(s): CHOL, HDL, LDLCALC, TRIG, CHOLHDL, LDLDIRECT in the last 72 hours. Thyroid Function Tests: No results for input(s): TSH, T4TOTAL, FREET4, T3FREE, THYROIDAB in the last 72 hours. Anemia Panel: No results for input(s): VITAMINB12, FOLATE, FERRITIN, TIBC, IRON, RETICCTPCT in the last 72 hours. Urine analysis:    Component Value Date/Time   COLORURINE YELLOW 11/22/2019 1722   APPEARANCEUR CLEAR 11/22/2019 1722   LABSPEC 1.030 11/22/2019 1722   PHURINE 5.0 11/22/2019 1722   GLUCOSEU NEGATIVE 11/22/2019 1722   HGBUR NEGATIVE 11/22/2019 1722   BILIRUBINUR NEGATIVE 11/22/2019 1722   KETONESUR NEGATIVE 11/22/2019 1722   PROTEINUR NEGATIVE 11/22/2019 1722   NITRITE NEGATIVE 11/22/2019 1722   LEUKOCYTESUR  SMALL (A) 11/22/2019 1722   Sepsis Labs: @LABRCNTIP (procalcitonin:4,lacticidven:4)  ) Recent Results (from the past 240 hour(s))  Culture, blood (routine x 2)     Status: None   Collection Time: 11/24/19  4:12 PM   Specimen: BLOOD  Result Value Ref Range Status   Specimen Description BLOOD LEFT ANTECUBITAL  Final   Special Requests   Final    BOTTLES DRAWN AEROBIC AND ANAEROBIC Blood Culture adequate volume   Culture   Final    NO GROWTH 5 DAYS Performed at [facility], [address] [place], [place] [address]    Report Status 11/29/2019 FINAL  Final  Culture, blood (routine x 2)     Status: None   Collection Time: 11/24/19  4:14 PM   Specimen: BLOOD  Result Value Ref Range Status   Specimen Description BLOOD RIGHT ANTECUBITAL  Final   Special Requests   Final    BOTTLES DRAWN AEROBIC AND ANAEROBIC Blood Culture adequate volume   Culture   Final    NO GROWTH 5 DAYS Performed at [facility], [address] [place], [place] [address]    Report Status 11/29/2019 FINAL  Final      Studies: CT ANGIO CHEST PE W OR WO CONTRAST  Result Date: 12/04/2019 CLINICAL DATA:  Acute hypoxic respiratory failure. EXAM: CT ANGIOGRAPHY CHEST WITH CONTRAST TECHNIQUE: Multidetector CT imaging of the chest was performed using the standard protocol during bolus administration of intravenous contrast. Multiplanar CT image reconstructions and MIPs were obtained to evaluate the vascular anatomy. CONTRAST:  75mL OMNIPAQUE IOHEXOL 350 MG/ML SOLN COMPARISON:  None. FINDINGS: Cardiovascular: Satisfactory opacification of the pulmonary arteries to the segmental level. No evidence of pulmonary embolism. Mild cardiomegaly is noted. No pericardial effusion. Mediastinum/Nodes: No enlarged mediastinal, hilar, or axillary lymph nodes. Thyroid gland, trachea, and esophagus demonstrate no significant findings. Lungs/Pleura: No pneumothorax is noted. Small bilateral pleural effusions are noted with  adjacent bibasilar subsegmental atelectasis. Upper Abdomen: No acute abnormality. Musculoskeletal: No chest wall abnormality. No acute or significant osseous findings. Review of the MIP images confirms the above findings. IMPRESSION: 1. No definite evidence of pulmonary embolus. 2. Small bilateral pleural effusions are noted with adjacent bibasilar subsegmental atelectasis. 3. Mild cardiomegaly. Electronically Signed   By: Lupita RaiderJames  Green Jr M.D.   On: 12/04/2019 08:41    Scheduled Meds: . arformoterol  15 mcg Nebulization BID  . budesonide (PULMICORT) nebulizer solution  0.5 mg Nebulization BID  . Chlorhexidine Gluconate Cloth  6 each Topical Daily  . gabapentin  100 mg Oral TID  .  heparin  5,000 Units Subcutaneous Q8H  . hydrOXYzine  25 mg Oral Once  . insulin aspart  0-20 Units Subcutaneous TID WC  . ipratropium-albuterol  3 mL Nebulization BID  . mouth rinse  15 mL Mouth Rinse BID  . melatonin  3 mg Oral QHS  . predniSONE  40 mg Oral Q breakfast  . sodium chloride flush  10-40 mL Intracatheter Q12H    Continuous Infusions: . sodium chloride Stopped (11/23/19 1428)  . penicillin g continuous IV infusion 12 Million Units (12/04/19 0400)     LOS: 12 days     Darlin Drop, MD [facility] Pager [phone]  If 7PM-7AM, please contact night-coverage [email or web address] Password [email or web address] 12/04/2019, 11:19 AM

## 2019-12-05 DIAGNOSIS — J9601 Acute respiratory failure with hypoxia: Secondary | ICD-10-CM

## 2019-12-05 DIAGNOSIS — R0902 Hypoxemia: Secondary | ICD-10-CM

## 2019-12-05 DIAGNOSIS — R531 Weakness: Secondary | ICD-10-CM

## 2019-12-05 LAB — BASIC METABOLIC PANEL
Anion gap: 6 (ref 5–15)
BUN: 14 mg/dL (ref 8–23)
CO2: 33 mmol/L — ABNORMAL HIGH (ref 22–32)
Calcium: 8.9 mg/dL (ref 8.9–10.3)
Chloride: 89 mmol/L — ABNORMAL LOW (ref 98–111)
Creatinine, Ser: 0.99 mg/dL (ref 0.61–1.24)
GFR calc Af Amer: >60 mL/min (ref >60)
GFR calc non Af Amer: >60 mL/min (ref >60)
Glucose, Bld: 330 mg/dL — ABNORMAL HIGH (ref 70–99)
Potassium: 5.8 mmol/L — ABNORMAL HIGH (ref 3.5–5.1)
Sodium: 128 mmol/L — ABNORMAL LOW (ref 135–145)

## 2019-12-05 LAB — GLUCOSE, CAPILLARY
Glucose-Capillary: 185 mg/dL — ABNORMAL HIGH (ref 70–99)
Glucose-Capillary: 189 mg/dL — ABNORMAL HIGH (ref 70–99)
Glucose-Capillary: 221 mg/dL — ABNORMAL HIGH (ref 70–99)
Glucose-Capillary: 181 mg/dL — ABNORMAL HIGH (ref 70–99)
Glucose-Capillary: 215 mg/dL — ABNORMAL HIGH (ref 70–99)
Glucose-Capillary: 130 mg/dL — ABNORMAL HIGH (ref 70–99)

## 2019-12-05 MED ORDER — DEXTROSE 50 % IV SOLN
INTRAVENOUS | Status: AC
  Administered 2019-12-05: 25 mL
  Filled 2019-12-05: qty 50

## 2019-12-05 MED ORDER — INSULIN ASPART 100 UNIT/ML IV SOLN
10.0000 [IU] | Freq: Once | INTRAVENOUS | Status: AC
  Administered 2019-12-05: 10 [IU] via INTRAVENOUS

## 2019-12-05 MED ORDER — CALCIUM GLUCONATE-NACL 1-0.675 GM/50ML-% IV SOLN
1.0000 g | Freq: Once | INTRAVENOUS | Status: AC
  Administered 2019-12-05: 1000 mg via INTRAVENOUS
  Filled 2019-12-05: qty 50

## 2019-12-05 MED ORDER — DEXTROSE 50 % IV SOLN: 25.0000 mL | Freq: Once | INTRAVENOUS | Status: AC

## 2019-12-05 NOTE — Progress Notes (Addendum)
Inpatient Rehab Admissions Coordinator:   I met with this Pt. And his wife at bedside for follow up conversation regarding potential CIR admit. I do not have a bed available today. Pt. Continues to require 15 L via HFNC  so will continue to monitor oxygen needs before opening a case with [employer]s insurance.  Clemens Catholic, [place], [place] Admissions Coordinator  [phone] ([place]) [phone] (office)

## 2019-12-05 NOTE — Progress Notes (Addendum)
PROGRESS NOTE  Kevin [name]. NWG:[id number] DOB: [birth year] DOA: 11/22/2019 PCP: Malka So., MD  HPI/Recap of past 24 hours: Patient is a 69 year old male with essential hypertension, morbid obesity, type II diabetes mellitus, asthma, hypercholesterolemia, Mobitz type II AV block S/p pacemaker placement & OSA   presented with a 1 week history of worsening p.o. intake, vague abdominal and chest pain, diarrhea, and decreased appetite.    In the emergency room he was noted to be in septic shock and was started on vasopressor.  Transferred to the ICU.  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  ID was consulted and followed, recommended IV penicillin x8 weeks.  Neurosurgery was also consulted and recommended to repeat an MRI in about 2 weeks.   Transferred to [facility] on 11/30/2019.  Hospital course complicated by acute hypoxic respiratory failure secondary to pulmonary edema with increased oxygen requirement up to 15 L high flow nasal cannula.  Ongoing diuresing, IV Lasix increased to 40 mg twice daily.  Judicious diuresis due to soft blood pressures.  Reports sharp neck pain, started on gabapentin.  9/5: Hypoxic and worsening hypercarbia on ABG.  CTA PE negative.  PCCM consulted to assist with the management.    12/05/19: Seen and examined.  States his neck pain is a 2 out of 10.  No other complaints.  Assessment/Plan: Active Problems:   Hypotension   Pressure injury of skin   Palliative care by specialist   Goals of care, counseling/discussion   DNR (do not resuscitate) discussion  Resolved Acute metabolic encephalopathy secondary to hypercarbia  Presented with hypoxia and hypercarbia on ABG Mentation has improved with nightly BiPAP, encouraged to comply, he was receptive.  Septic shock, resolved, secondary to cervical ventral epidural abscess with strep intermedius on blood culture Presented severely hypotensive requiring  vasopressors and admitted to ICU, initially received broad IV antibiotics Currently on IV penicillin as recommended by infectious disease Afebrile with no leukocytosis Repeat blood cultures done on 11/24/2019 - final.  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 We will repeat cervical spine MRI on 12/14/2019.  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 Management as stated above with BiPAP nightly Patient has continued to be noncompliant with his BiPAP ABG done on 12/04/2019 showed 7.3/66/71 on 80% FiO2. Reviewed CTA chest no evidence of pulmonary embolism, bilateral pleural effusions R>L. IV lasix on hold due to IV contrast from CTA Continue bronchodilators Continue incentive spirometer and flutter valve Continue to maintain O2 saturation greater than 90%, wean off oxygen supplementation as tolerated  Noncompliance with medical management Had a lengthy conversation on 9/4 and 9/5 with the patient regarding the importance of compliance with his BIPAP He understands but has not fully complied  Strep intermedius bacteremia Management as stated above - TEE  8/30-- no evidence of endocarditis, PM wire infection  Continue penicillin G as recommended by ID  Dm2 with hyperglycemia Hemoglobin A1c 7.1 on 11/22/2019 Continue insulin sliding scale  Severe morbid obesity               BMI 41                Recommend weight loss outpatient with  regular physical activity and healthy dieting.  Situational insomnia Start melatonin nightly   Physical debility PT recommended CIR Out of bed to chair with assistance Fall precautions  Stage 2 sacral pressure ulcer, POA Continue local wound care  Goals of care Palliative Care team consulted to assist with establishing goals of care and following Patient is limited code no CPR, no intubation, ACLS medications, vasopressor, and NIV okay as of 12/05/2019.    Code Status: Full code  Family Communication: Updated spouse via phone on 12/04/2019   Consultants:  PCCM  Palliative care team   Antimicrobials:  Penicillin G  DVT prophylaxis: Subcu heparin 3 times daily  Disposition:  Status is: Inpatient    Dispo: The patient is from: Home.              Anticipated d/c is to: CIR              Anticipated d/c date is: 12/07/2019              Patient currently not stable for discharge due to ongoing management of acute hypoxic hypercarbic respiratory failure.      Objective: Vitals:   12/05/19 0046 12/05/19 0347 12/05/19 0426 12/05/19 0657  BP: 120/77 121/66 121/66   Pulse: 69 71 66   Resp: 20 18 16    Temp: 98 F (36.7 C) 98.2 F (36.8 C)    TempSrc: Oral Oral    SpO2: 92% 93% 94% 96%  Weight:  124.3 kg    Height:        Intake/Output Summary (Last 24 hours) at 12/05/2019 0745 Last data filed at 12/05/2019 0300 Gross per 24 hour  Intake 550 ml  Output 725 ml  Net -175 ml   Filed Weights   12/03/19 0600 12/04/19 0435 12/05/19 0347  Weight: 127.6 kg 125.8 kg 124.3 kg    Exam:  . General: 69 y.o. year-old male obese in no acute distress.  Alert oriented x3. . Cardiovascular: Regular rate and rhythm no rubs or gallops.   Marland Kitchen. Respiratory: Mild rales at bases no wheezing noted.  \ . Abdomen: Obese nontender normal bowel sounds present. . Musculoskeletal: Trace lower extremity edema bilaterally.   Marland Kitchen. Psychiatry: Mood is appropriate for condition and  setting..   Data Reviewed: CBC: Recent Labs  Lab 11/29/19 0605 11/30/19 0502 12/01/19 0507  WBC 11.4* 9.2 8.6  NEUTROABS 9.0* 7.0 6.4  HGB 10.4* 9.8* 9.9*  HCT 32.8* 31.7* 31.8*  MCV 94.5 94.1 95.8  PLT 253 266 338   Basic Metabolic Panel: Recent Labs  Lab 11/30/19 0502 12/02/19 0645 12/03/19 0609 12/04/19 0500 12/05/19 0500  NA 137 136 131* 136 128*  K 4.0 4.3 5.4* 4.6 5.8*  CL 97* 95* 89* 94* 89*  CO2 33* 36* 35* 37* 33*  GLUCOSE 146* 136* 270* 178* 330*  BUN 18 12 10 12 14   CREATININE 0.79 0.70 0.86 0.75 0.99  CALCIUM 8.9 9.0 8.8* 9.3 8.9   GFR: Estimated Creatinine Clearance: 90.5 mL/min (by C-G formula based on SCr of 0.99 mg/dL). Liver Function Tests: No results for input(s): AST, ALT, ALKPHOS, BILITOT, PROT, ALBUMIN in the last 168 hours. No results for input(s): LIPASE, AMYLASE in the last 168 hours. No results for input(s): AMMONIA in the  last 168 hours. Coagulation Profile: No results for input(s): INR, PROTIME in the last 168 hours. Cardiac Enzymes: No results for input(s): CKTOTAL, CKMB, CKMBINDEX, TROPONINI in the last 168 hours. BNP (last 3 results) No results for input(s): PROBNP in the last 8760 hours. HbA1C: No results for input(s): HGBA1C in the last 72 hours. CBG: Recent Labs  Lab 12/04/19 1121 12/04/19 1250 12/04/19 1612 12/04/19 1824 12/04/19 2144  GLUCAP 181* 174* 165* 216* 120*   Lipid Profile: No results for input(s): CHOL, HDL, LDLCALC, TRIG, CHOLHDL, LDLDIRECT in the last 72 hours. Thyroid Function Tests: No results for input(s): TSH, T4TOTAL, FREET4, T3FREE, THYROIDAB in the last 72 hours. Anemia Panel: No results for input(s): VITAMINB12, FOLATE, FERRITIN, TIBC, IRON, RETICCTPCT in the last 72 hours. Urine analysis:    Component Value Date/Time   COLORURINE YELLOW 11/22/2019 1722   APPEARANCEUR CLEAR 11/22/2019 1722   LABSPEC 1.030 11/22/2019 1722   PHURINE 5.0 11/22/2019 1722   GLUCOSEU NEGATIVE 11/22/2019 1722    HGBUR NEGATIVE 11/22/2019 1722   BILIRUBINUR NEGATIVE 11/22/2019 1722   KETONESUR NEGATIVE 11/22/2019 1722   PROTEINUR NEGATIVE 11/22/2019 1722   NITRITE NEGATIVE 11/22/2019 1722   LEUKOCYTESUR SMALL (A) 11/22/2019 1722   Sepsis Labs: @LABRCNTIP (procalcitonin:4,lacticidven:4)  ) No results found for this or any previous visit (from the past 240 hour(s)).    Studies: CT ANGIO CHEST PE W OR WO CONTRAST  Result Date: 12/04/2019 CLINICAL DATA:  Acute hypoxic respiratory failure. EXAM: CT ANGIOGRAPHY CHEST WITH CONTRAST TECHNIQUE: Multidetector CT imaging of the chest was performed using the standard protocol during bolus administration of intravenous contrast. Multiplanar CT image reconstructions and MIPs were obtained to evaluate the vascular anatomy. CONTRAST:  18mL OMNIPAQUE IOHEXOL 350 MG/ML SOLN COMPARISON:  None. FINDINGS: Cardiovascular: Satisfactory opacification of the pulmonary arteries to the segmental level. No evidence of pulmonary embolism. Mild cardiomegaly is noted. No pericardial effusion. Mediastinum/Nodes: No enlarged mediastinal, hilar, or axillary lymph nodes. Thyroid gland, trachea, and esophagus demonstrate no significant findings. Lungs/Pleura: No pneumothorax is noted. Small bilateral pleural effusions are noted with adjacent bibasilar subsegmental atelectasis. Upper Abdomen: No acute abnormality. Musculoskeletal: No chest wall abnormality. No acute or significant osseous findings. Review of the MIP images confirms the above findings. IMPRESSION: 1. No definite evidence of pulmonary embolus. 2. Small bilateral pleural effusions are noted with adjacent bibasilar subsegmental atelectasis. 3. Mild cardiomegaly. Electronically Signed   By: 70m M.D.   On: 12/04/2019 08:41    Scheduled Meds: . arformoterol  15 mcg Nebulization BID  . budesonide (PULMICORT) nebulizer solution  0.5 mg Nebulization BID  . Chlorhexidine Gluconate Cloth  6 each Topical Daily  . gabapentin   100 mg Oral TID  . heparin  5,000 Units Subcutaneous Q8H  . hydrOXYzine  25 mg Oral Once  . insulin aspart  0-20 Units Subcutaneous TID WC  . insulin aspart  10 Units Intravenous Once  . ipratropium-albuterol  3 mL Nebulization BID  . mouth rinse  15 mL Mouth Rinse BID  . melatonin  3 mg Oral QHS  . predniSONE  40 mg Oral Q breakfast  . sodium chloride flush  10-40 mL Intracatheter Q12H    Continuous Infusions: . sodium chloride Stopped (11/23/19 1428)  . calcium gluconate    . penicillin g continuous IV infusion 12 Million Units (12/05/19 0537)     LOS: 13 days     02/04/20, MD [facility] Pager [phone]  If 7PM-7AM, please contact night-coverage [email or web address] Password [email or web address]  12/05/2019, 7:45 AM

## 2019-12-05 NOTE — Progress Notes (Signed)
Pt worn BiPap from around 23:45 to 05:45; per pt tolerated well.

## 2019-12-05 NOTE — Progress Notes (Signed)
Patient ID: Kevin Ebbs., male   DOB: 1950-12-27, 68 y.o.   MRN: [id number]  This NP visited patient at the bedside as a follow up to for palliative medicine needs and emotional support.  Patient is out of bed to the chair, wife at bedside.  Continue conversation regarding current medical situation.  Patient had a long conversation yesterday with palliative medicine and has completed a MOST form to reflect limited interventions.  While here in the hospital the patient would agree to ACLS meds and vasopressors but no compressions, cardioversion or intubation desired.  Patient is hopeful and open to all offered and medical interventions to prolong quality of life.  He and his wife are hopeful for transition to CIR for rehabilitation when ready for discharge.  He tells me he is actively utilizing his handheld inspirometer and flutter valve hoping to improve pulmonary status.  Location offered to the patient and his wife the importance of continued conversation with each other and the  medical providers regarding overall plan of care and treatment options,  ensuring decisions are within the context of the patients values and GOCs.  PMT will continue to support holistically  Questions and concerns addressed   Discussed with Dr Margo Aye  Total time spent on the unit was 25 minutes  Greater than 50% of the time was spent in counseling and coordination of care  Lorinda Creed NP  Palliative Medicine Team Team Phone # [phone] Pager [phone]

## 2019-12-05 NOTE — Progress Notes (Signed)
Occupational Therapy Treatment Patient Details Name: Kevin [name]. MRN: [id number] DOB: 09/02/1950 Today's Date: 12/05/2019    History of present illness 69 year old male brought to the emergency room for evaluation of a 1 to 1-1/2-week history of worsening lethargy malaise decreased p.o. intake. 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 Pt found to have acute encephalopathy in setting of hypercarbia, sepsis   OT comments  Patient agreeable to OT session.  OT focus was activity tolerance, B UE AROM, transfer status and LB ADL progression.  Patient demonstrated good participation and motivation this date.  He is moving from sit to stand much better, functional transfers have improved and and his neck and shoulder pain is better, allowing for enhanced LB ADL participation.  OT scheduled full ADL for next session.  Patient remains on 15L of HF O2 via [place].  O2 sats monitored throughout, and ranged 94-96%.  BP taken at conclusion 104/70.  Patient with no complaints, and all questions answered.  Continue to follow in acute with no change to discharge recommendations.     Follow Up Recommendations  CIR;Supervision/Assistance - 24 hour                Precautions / Restrictions Precautions Precautions: Fall Precaution Comments: monitor SpO2 (on 12 liters HFNC) Restrictions Weight Bearing Restrictions: No       Mobility Bed Mobility                  Transfers       Sit to Stand: Min guard Stand pivot transfers: Min guard       General transfer comment: improved transfer and mobility status.    Balance   Sitting-balance support: No upper extremity supported Sitting balance-Leahy Scale: Good     Standing balance support: Bilateral upper extremity supported;During functional activity Standing balance-Leahy Scale: Fair                             ADL either performed or  assessed with clinical judgement   ADL   Eating/Feeding: Independent;Sitting   Grooming: Set up;Sitting               Lower Body Dressing: Supervision/safety;Set up;Min guard;Minimal assistance;Sit to/from stand Lower Body Dressing Details (indicate cue type and reason): able to change B socks from seated position. Toilet Transfer: Supervision/safety;Set up;Minimal assistance;RW   Toileting- Clothing Manipulation and Hygiene: Moderate assistance;Sit to/from stand       Functional mobility during ADLs: Min guard;Minimal assistance;Rolling walker;Cueing for safety                       General Comments      Pertinent Vitals/ Pain       Faces Pain Scale: Hurts a little bit Pain Location: L shoulder Pain Descriptors / Indicators: Dull Pain Intervention(s): Monitored during session                                                          Frequency  Min 2X/week        Progress Toward Goals  OT Goals(current goals can now be found in the care plan section)  Progress towards OT goals: Progressing toward goals  Acute  Rehab OT Goals Patient Stated Goal: I'd like to get rehab and increase my independence before I go home. OT Goal Formulation: With patient/family Time For Goal Achievement: 12/12/19 Potential to Achieve Goals: Good  Plan Discharge plan remains appropriate    Co-evaluation                 AM-PAC OT "6 Clicks" Daily Activity     Outcome Measure   Help from another person eating meals?: None Help from another person taking care of personal grooming?: None Help from another person toileting, which includes using toliet, bedpan, or urinal?: A Lot Help from another person bathing (including washing, rinsing, drying)?: A Lot Help from another person to put on and taking off regular upper body clothing?: A Little Help from another person to put on and taking off regular lower body clothing?: A Lot 6 Click Score:  17    End of Session Equipment Utilized During Treatment: Rolling walker  OT Visit Diagnosis: Unsteadiness on feet (R26.81);Other abnormalities of gait and mobility (R26.89);Muscle weakness (generalized) (M62.81);Pain;Other symptoms and signs involving cognitive function Pain - Right/Left: Left Pain - part of body: Shoulder   Activity Tolerance Patient tolerated treatment well;No increased pain   Patient Left in chair;with call bell/phone within reach;with family/visitor present   Nurse Communication          Time: 6767-2094 OT Time Calculation (min): 27 min  Charges: OT General Charges $OT Visit: 1 Visit OT Treatments $Self Care/Home Management : 23-37 mins  12/05/2019  Rich, OTR/L  Acute Rehabilitation Services  Office:  [phone]    Kevin [name] 12/05/2019, 1:06 PM

## 2019-12-06 LAB — BASIC METABOLIC PANEL
Anion gap: 7 (ref 5–15)
BUN: 15 mg/dL (ref 8–23)
CO2: 34 mmol/L — ABNORMAL HIGH (ref 22–32)
Calcium: 9 mg/dL (ref 8.9–10.3)
Chloride: 93 mmol/L — ABNORMAL LOW (ref 98–111)
Creatinine, Ser: 0.85 mg/dL (ref 0.61–1.24)
GFR calc Af Amer: >60 mL/min (ref >60)
GFR calc non Af Amer: >60 mL/min (ref >60)
Glucose, Bld: 282 mg/dL — ABNORMAL HIGH (ref 70–99)
Potassium: 5.7 mmol/L — ABNORMAL HIGH (ref 3.5–5.1)
Sodium: 134 mmol/L — ABNORMAL LOW (ref 135–145)

## 2019-12-06 LAB — GLUCOSE, CAPILLARY
Glucose-Capillary: 132 mg/dL — ABNORMAL HIGH (ref 70–99)
Glucose-Capillary: 140 mg/dL — ABNORMAL HIGH (ref 70–99)
Glucose-Capillary: 147 mg/dL — ABNORMAL HIGH (ref 70–99)
Glucose-Capillary: 134 mg/dL — ABNORMAL HIGH (ref 70–99)

## 2019-12-06 MED ORDER — BISACODYL 10 MG RE SUPP: 10.0000 mg | Freq: Every day | RECTAL | Status: DC | PRN

## 2019-12-06 MED ORDER — SENNOSIDES-DOCUSATE SODIUM 8.6-50 MG PO TABS
2.0000 | ORAL_TABLET | Freq: Two times a day (BID) | ORAL | Status: DC
  Administered 2019-12-06 – 2019-12-13 (×11): 2 via ORAL
  Filled 2019-12-06 (×13): qty 2

## 2019-12-06 NOTE — Progress Notes (Signed)
Inpatient Rehabilitation Admissions Coordinator  I met with patient at bedside. He is up in chair and much improved since last week. I await further progress with weaning oxygen before pursuing insurance approval with [facility] Medicare . On 10 to 12 Liters HFNC at present.  Danne Baxter, RN, MSN Rehab Admissions Coordinator [phone] 12/06/2019 12:50 PM

## 2019-12-06 NOTE — Progress Notes (Signed)
PROGRESS NOTE  Kevin L Vides Jr. 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    In the emergency room he was noted to be in septic shock and was started on vasopressor.  Transferred to the ICU.  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  ID was consulted and followed, recommended IV penicillin x8 weeks.  Neurosurgery was also consulted and recommended to repeat an MRI in about 2 weeks, 12/14/19.   Transferred to [facility] on 11/30/2019.  Hospital course complicated by acute hypoxic respiratory failure secondary to pulmonary edema with increase in oxygen requirement up to 15 L high flow nasal cannula.  Received diuresing with IV Lasix.  Held due to contrast from CTA, which ruled out pulmonary embolism.    9/5: Hypoxic and worsening hypercarbia on ABG.  PCCM consulted to assist with the management.    12/06/19: Seen and examined.  Now more compliant with using BiPAP at night and receptive to using incentive spirometer and flutter valve.  Assessment/Plan: Active Problems:   Hypotension   Pressure injury of skin   Palliative care by specialist   Goals of care, counseling/discussion   DNR (do not resuscitate) discussion   Hypoxia   Acute hypoxemic respiratory failure (HCC)   Weakness generalized  Resolved Acute metabolic encephalopathy secondary to hypercarbia  Presented with hypoxia and hypercarbia on ABG Mentation has improved with nightly BiPAP, encouraged to comply, he was receptive.  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 Continue pain control, started gabapentin 100 mg 3 times daily.  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   Continue to encourage to use BIPAP nightly. ABG done on 12/04/2019 showed 7.3/66/71 on 80% FiO2. Reviewed CTA chest no evidence of pulmonary embolism, bilateral pleural effusions R>L. IV lasix on hold due to IV contrast from CTA Continue bronchodilators Continue incentive spirometer and flutter valve Continue to maintain O2 saturation greater than 90%, wean off oxygen supplementation as tolerated  Improved, noncompliance with medical management Had a lengthy conversation on 9/4 and 9/5 with the patient regarding the importance of compliance with his BIPAP He understands and is making efforts to be compliant  Strep intermedius bacteremia Management as stated above - TEE  8/30-- no evidence of endocarditis, PM wire infection  Continue penicillin G as recommended by ID  Dm2 with hyperglycemia Hemoglobin A1c 7.1 on 11/22/2019 Continue insulin sliding scale               Severe morbid obesity  BMI 41                Recommend weight loss outpatient with regular physical activity and healthy dieting.  Situational  insomnia Continue melatonin nightly  Physical debility PT recommended CIR Out of bed to chair with assistance Fall precautions  Stage 2 sacral pressure ulcer, POA Continue local wound care  Constipation Senokot 2 tablet p.o. twice daily Add MiraLAX daily as needed Add Dulcolax suppository daily as needed   Goals of care Palliative Care team consulted to assist with establishing goals of care and following Patient is limited code no CPR, no intubation, ACLS medications, vasopressor, and NIV okay as of 12/05/2019.    Code Status: Full code  Family Communication: Updated spouse via phone on 12/04/2019   Consultants:  PCCM  Palliative care team   Antimicrobials:  Penicillin G  DVT prophylaxis: Subcu heparin 3 times daily  Disposition:  Status is: Inpatient    Dispo: The patient is from: Home.              Anticipated d/c is to: CIR              Anticipated d/c date is: 12/08/2019              Patient currently not stable for discharge due to ongoing management of acute hypoxic hypercarbic respiratory failure.      Objective: Vitals:   12/06/19 0757 12/06/19 0848 12/06/19 1155 12/06/19 1507  BP: 114/69  100/70   Pulse: 71  71   Resp: 20  20   Temp: 98.6 F (37 C)  97.8 F (36.6 C)   TempSrc: Oral  Oral   SpO2: 94% 96% 95% 98%  Weight:      Height:        Intake/Output Summary (Last 24 hours) at 12/06/2019 1607 Last data filed at 12/06/2019 1356 Gross per 24 hour  Intake 250 ml  Output 850 ml  Net -600 ml   Filed Weights   12/04/19 0435 12/05/19 0347 12/06/19 0411  Weight: 125.8 kg 124.3 kg 127.7 kg    Exam:  . General: 69 y.o. year-old male obese pleasant in no acute distress.  Alert and oriented x3.  . Cardiovascular: Regular rate and rhythm no rubs or gallops.   Marland Kitchen Respiratory: Mild rales at bases no wheezing noted.   . Abdomen: Obese nontender normal bowel sounds present. . Musculoskeletal: Trace lower extremity edema  bilaterally. Marland Kitchen Psychiatry: Mood is appropriate for condition and setting.   Data Reviewed: CBC: Recent Labs  Lab 11/30/19 0502 12/01/19 0507  WBC 9.2 8.6  NEUTROABS 7.0 6.4  HGB 9.8* 9.9*  HCT 31.7* 31.8*  MCV 94.1 95.8  PLT 266 338   Basic Metabolic Panel: Recent Labs  Lab 12/02/19 0645 12/03/19 0609 12/04/19 0500 12/05/19 0500 12/06/19 0708  NA 136 131* 136 128* 134*  K 4.3 5.4* 4.6 5.8* 5.7*  CL 95* 89* 94* 89* 93*  CO2 36* 35* 37* 33* 34*  GLUCOSE 136* 270* 178* 330* 282*  BUN 12 10 12 14 15   CREATININE 0.70 0.86 0.75 0.99 0.85  CALCIUM 9.0 8.8* 9.3 8.9 9.0   GFR: Estimated Creatinine Clearance: 107 mL/min (by C-G formula based on SCr of 0.85 mg/dL). Liver Function Tests: No results for input(s): AST, ALT, ALKPHOS, BILITOT, PROT, ALBUMIN in the last 168 hours. No results for input(s): LIPASE, AMYLASE in the last 168 hours. No results for input(s): AMMONIA in the last 168 hours. Coagulation Profile: No  results for input(s): INR, PROTIME in the last 168 hours. Cardiac Enzymes: No results for input(s): CKTOTAL, CKMB, CKMBINDEX, TROPONINI in the last 168 hours. BNP (last 3 results) No results for input(s): PROBNP in the last 8760 hours. HbA1C: No results for input(s): HGBA1C in the last 72 hours. CBG: Recent Labs  Lab 12/05/19 1155 12/05/19 1717 12/05/19 2126 12/06/19 0754 12/06/19 1153  GLUCAP 181* 215* 130* 132* 140*   Lipid Profile: No results for input(s): CHOL, HDL, LDLCALC, TRIG, CHOLHDL, LDLDIRECT in the last 72 hours. Thyroid Function Tests: No results for input(s): TSH, T4TOTAL, FREET4, T3FREE, THYROIDAB in the last 72 hours. Anemia Panel: No results for input(s): VITAMINB12, FOLATE, FERRITIN, TIBC, IRON, RETICCTPCT in the last 72 hours. Urine analysis:    Component Value Date/Time   COLORURINE YELLOW 11/22/2019 1722   APPEARANCEUR CLEAR 11/22/2019 1722   LABSPEC 1.030 11/22/2019 1722   PHURINE 5.0 11/22/2019 1722   GLUCOSEU NEGATIVE  11/22/2019 1722   HGBUR NEGATIVE 11/22/2019 1722   BILIRUBINUR NEGATIVE 11/22/2019 1722   KETONESUR NEGATIVE 11/22/2019 1722   PROTEINUR NEGATIVE 11/22/2019 1722   NITRITE NEGATIVE 11/22/2019 1722   LEUKOCYTESUR SMALL (A) 11/22/2019 1722   Sepsis Labs: @LABRCNTIP (procalcitonin:4,lacticidven:4)  ) No results found for this or any previous visit (from the past 240 hour(s)).    Studies: No results found.  Scheduled Meds: . arformoterol  15 mcg Nebulization BID  . budesonide (PULMICORT) nebulizer solution  0.5 mg Nebulization BID  . Chlorhexidine Gluconate Cloth  6 each Topical Daily  . gabapentin  100 mg Oral TID  . heparin  5,000 Units Subcutaneous Q8H  . hydrOXYzine  25 mg Oral Once  . insulin aspart  0-20 Units Subcutaneous TID WC  . ipratropium-albuterol  3 mL Nebulization BID  . mouth rinse  15 mL Mouth Rinse BID  . melatonin  3 mg Oral QHS  . senna-docusate  2 tablet Oral BID  . sodium chloride flush  10-40 mL Intracatheter Q12H    Continuous Infusions: . sodium chloride 20 mL (12/05/19 0830)  . penicillin g continuous IV infusion 12 Million Units (12/06/19 0618)     LOS: 14 days     02/05/20, MD [facility] Pager [phone]  If 7PM-7AM, please contact night-coverage [email or web address] Password [email or web address] 12/06/2019, 4:07 PM

## 2019-12-06 NOTE — Progress Notes (Signed)
Physical Therapy Treatment Patient Details Name: Kevin [name]. MRN: [id number] DOB: 09-21-1950 Today's Date: 12/06/2019    History of Present Illness 69 year old male brought to the emergency room for evaluation of a 1 to 1-1/2-week history of worsening lethargy malaise decreased p.o. intake. 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 Pt found to have acute encephalopathy in setting of hypercarbia, sepsis    PT Comments    Pt had just returned to bed after sitting up through lunch, however agreeable to ambulation with PT today. Pt continues to be limited in safe mobility by increased O2 demand (able to ambulate on 10L O2 via HFNC, and at rest on 8L O2, with SaO2 in 90%). Pt is also limited in safe mobility by decreased strength and endurance. Pt is currently min guard for bed mobility and transfers and min A for ambulation with RW. D/c plans remain appropriate at this time. PT will continue to follow acutely.    Follow Up Recommendations  CIR     Equipment Recommendations  Rolling walker with 5" wheels;3in1 (PT)       Precautions / Restrictions Precautions Precautions: Fall Precaution Comments: monitor SpO2 (on 9 liters HFNC) Restrictions Weight Bearing Restrictions: No    Mobility  Bed Mobility Overal bed mobility: Needs Assistance Bed Mobility: Supine to Sit     Supine to sit: Supervision Sit to supine: Min guard   General bed mobility comments: supervision for safety to come to the EoB, min guard for LE management into bed no assist required  Transfers Overall transfer level: Needs assistance Equipment used: Rolling walker (2 wheeled)   Sit to Stand: Min guard         General transfer comment: good power up and steadying in RW  Ambulation/Gait Ambulation/Gait assistance: Min assist;Min guard Gait Distance (Feet): 40 Feet Assistive device: Rolling walker (2 wheeled) Gait  Pattern/deviations: Decreased stride length;Shuffle;Trunk flexed;Step-to pattern Gait velocity: slowed  Gait velocity interpretation: <1.31 ft/sec, indicative of household ambulator General Gait Details: min guard to min A for steadying with fatigue in ambulation          Balance Overall balance assessment: Needs assistance Sitting-balance support: Bilateral upper extremity supported Sitting balance-Leahy Scale: Fair Sitting balance - Comments: initially requires modA to maintain seated balance with sitting duration able to place UE in lap and maintain balance.    Standing balance support: Bilateral upper extremity supported;During functional activity Standing balance-Leahy Scale: Poor Standing balance comment: reliant on UE support on RW and additional support from therapists                            Cognition Arousal/Alertness: Awake/alert Behavior During Therapy: WFL for tasks assessed/performed Overall Cognitive Status: Within Functional Limits for tasks assessed                                           General Comments General comments (skin integrity, edema, etc.): Pt on 9L O2 via HFNC, ambulated on 10L O2 via [place] and able to maintain SaO2 >94%O2 throughout session with return to bed, weaned pt back to  8L O2 via [place] and able to maintain SaO2 96%      Pertinent Vitals/Pain Pain Assessment: Faces Faces Pain Scale: Hurts a little bit Pain Location: neck and L shoulder Pain  Descriptors / Indicators: Grimacing;Guarding Pain Intervention(s): Monitored during session;Repositioned;Other (comment) (applied topical pain medication )           PT Goals (current goals can now be found in the care plan section) Acute Rehab PT Goals Patient Stated Goal: to be free of pain and be able to walk PT Goal Formulation: With patient Time For Goal Achievement: 12/10/19 Potential to Achieve Goals: Good Progress towards PT goals: Progressing toward goals     Frequency    Min 3X/week      PT Plan Current plan remains appropriate       AM-PAC PT "6 Clicks" Mobility   Outcome Measure  Help needed turning from your back to your side while in a flat bed without using bedrails?: A Little Help needed moving from lying on your back to sitting on the side of a flat bed without using bedrails?: A Little Help needed moving to and from a bed to a chair (including a wheelchair)?: A Little Help needed standing up from a chair using your arms (e.g., wheelchair or bedside chair)?: A Little Help needed to walk in hospital room?: A Little Help needed climbing 3-5 steps with a railing? : Total 6 Click Score: 16    End of Session Equipment Utilized During Treatment: Oxygen;Gait belt Activity Tolerance: Patient tolerated treatment well Patient left: with call bell/phone within reach;in bed Nurse Communication: Mobility status PT Visit Diagnosis: Unsteadiness on feet (R26.81);Other abnormalities of gait and mobility (R26.89);Muscle weakness (generalized) (M62.81)     Time: 7096-2836 PT Time Calculation (min) (ACUTE ONLY): 20 min  Charges:  $Gait Training: 8-22 mins                     Elliemae Braman B. Beverely Risen PT, DPT Acute Rehabilitation Services Pager [phone] Office [phone]    Kevin [name] Fleet 12/06/2019, 4:10 PM

## 2019-12-06 NOTE — Progress Notes (Signed)
RT placed patient on BIPAP at this time. Patient tolerating well. RT will monitor as needed.

## 2019-12-07 ENCOUNTER — Encounter (HOSPITAL_COMMUNITY): Payer: Self-pay | Admitting: General Practice

## 2019-12-07 DIAGNOSIS — R0602 Shortness of breath: Secondary | ICD-10-CM

## 2019-12-07 LAB — GLUCOSE, CAPILLARY
Glucose-Capillary: 192 mg/dL — ABNORMAL HIGH (ref 70–99)
Glucose-Capillary: 93 mg/dL (ref 70–99)
Glucose-Capillary: 171 mg/dL — ABNORMAL HIGH (ref 70–99)
Glucose-Capillary: 145 mg/dL — ABNORMAL HIGH (ref 70–99)

## 2019-12-07 LAB — BASIC METABOLIC PANEL
Anion gap: 3 — ABNORMAL LOW (ref 5–15)
BUN: 16 mg/dL (ref 8–23)
CO2: 36 mmol/L — ABNORMAL HIGH (ref 22–32)
Calcium: 9.1 mg/dL (ref 8.9–10.3)
Chloride: 97 mmol/L — ABNORMAL LOW (ref 98–111)
Creatinine, Ser: 0.81 mg/dL (ref 0.61–1.24)
GFR calc Af Amer: >60 mL/min (ref >60)
GFR calc non Af Amer: >60 mL/min (ref >60)
Glucose, Bld: 119 mg/dL — ABNORMAL HIGH (ref 70–99)
Potassium: 4.1 mmol/L (ref 3.5–5.1)
Sodium: 136 mmol/L (ref 135–145)

## 2019-12-07 NOTE — Progress Notes (Signed)
   12/07/19 1210  Clinical Encounter Type  Visited With Patient and family together  Visit Type Initial;Other (Comment) (Advance Directive)  Referral From Palliative care team  Consult/Referral To Chaplain  This chaplain responded to PMT consult for AD education and completion.  The Pt. is sitting in the bedside recliner and the Pt. wife-Marilyn is present for the sharing of information.  The chaplain understands the Pt. is interested in completing the AD in the hospital. The Pt. and family will talk before completing the document with the chaplain. The incomplete document was left with the Pt.  The Pt. will communicate with the RN when ready to complete.  This chaplain is available for F/U spiritual care as needed.

## 2019-12-07 NOTE — Care Management Important Message (Signed)
Important Message  Patient Details  Name: Kevin [name]. MRN: [id number] Date of Birth: Apr 02, 1950   Medicare Important Message Given:  Yes     Renie Ora 12/07/2019, 12:19 PM

## 2019-12-07 NOTE — Progress Notes (Signed)
Patient ID: Johnston Ebbs., male   DOB: 1951/02/20, 69 y.o.   MRN: [id number]  This NP visited patient at the bedside as a follow up to for palliative medicine needs and emotional support.  Patient is out of bed to the chair.  He is pleasant and engaged, open to discussion regarding his current medical situation.  Education offered on importance of his participation in daily treatment plan specific to PT/OT sessions but all day long in his ADLS, education on importance of all movement.  Patient is hopeful and open to all offered and medical interventions to prolong quality of life.  He and his wife are hopeful for transition to CIR for rehabilitation when ready for discharge.  Education again offered to the patient regarding  the importance of continued conversation with each other and the  medical providers regarding overall plan of care and treatment options,  ensuring decisions are within the context of the patients values and GOCs.  PMT will continue to support holistically  Questions and concerns addressed   Discussed with bedside RN and TOC team  Total time spent on the unit was 15 minutes  Greater than 50% of the time was spent in counseling and coordination of care  Lorinda Creed NP  Palliative Medicine Team Team Phone # [phone] Pager [phone]

## 2019-12-07 NOTE — Progress Notes (Addendum)
PROGRESS NOTE    Kevin L Ballweg Jr.  WPY:[id number] DOB: July 24, 1950 DOA: 11/22/2019 PCP: Malka So., MD   Brief Narrative:  Patient is a 69 year old male with history of hypertension, morbid obesity, type 2 diabetes mellitus, asthma, hyperlipidemia, heart block status post pacemaker placement, OSA who presents from home with 1 week history of decreased appetite, poor oral intake, diarrhea, abdomen/chest pain.  On presentation, he was found to be in septic shock and was started on vasopressors and was transferred to ICU.  Work-up revealed cervical ventral epidural abscess.  Blood cultures showed Streptococcus intermedius.  ID, neurosurgery consulted.  He was transferred to [facility] service on 11/30/2019.  Hospital course complicated by acute hypoxic respiratory failure secondary to pulmonary edema requiring up to 5 to 10 L of high flow nasal cannula.  Received Lasix for volume overload.  His respiratory status has  improved.  PT/OT recommending CIR on discharge.  Patient is stable for discharge to CIR as soon as bed is available.  Assessment & Plan:   Active Problems:   Hypotension   Pressure injury of skin   Palliative care by specialist   Goals of care, counseling/discussion   DNR (do not resuscitate) discussion   Hypoxia   Acute hypoxemic respiratory failure (HCC)   Weakness generalized   Acute respiratory failure with hypoxia/hypercarbia: Multifactorial secondary atelectasis, acute pulmonary edema, pleural effusions on the background of OSA/OSH.  Not on oxygen at home.  He required high flow oxygen up to 15 L/min.  Oxygen requirement has improved and he is feeling more comfortable.  Today he was on 3 L of oxygen per minute.  CTA of the chest did not show any PE.  Continue to wean the oxygen.  He was not on oxygen at home. Encourage BiPAP at night.  Septic shock with strep bacteremia: Secondary to cervical ventral epidural abscess.  Blood cultures showed Streptococcus intermedius.  ID was  following.  Currently on IV penicillin for total of 8-week duration with end date of 01/16/2020. TEE done on 8/30 did not show any evidence of endocarditis or pacemaker wire infection.  Cervical ventral epidural abscess: Neurosurgery was following.  Plan is to repeat MRI in 2 weeks on 12/14/2019.  Continue pain management.  On gabapentin.  History of OSA/possible OHS: Noncompliant with CPAP at home.  We will recommend to do a sleep study as an outpatient for initiation of CPAP.  Diabetes type 2 with hyperglycemia: Hemoglobin A1c of 7.1.  Continue current insulin regimen.  Morbid obesity: BMI of 41.  Recommend weight loss, balanced diet  Stage II sacral ulcer: Present on admission.  Continue local wound care.  Constipation: Continue bowel regimen  Goals of care: Palliative care was following the patient.  Partial code.  No CPR/intubation but okay to ACLS medications/vasopressors.  Deconditioning/debility: PT/OT recommended CIR.               DVT prophylaxis:Heparin [place] Code Status: Partial Family Communication: Wife at the bedside low placement for patient in no Status is: Inpatient  Remains inpatient appropriate because:Inpatient level of care appropriate due to severity of illness   Dispo:  Patient From: Home  Planned Disposition: Inpatient Rehab  Expected discharge date: 12/09/19  Medically stable for discharge: Yes!     Consultants: ID,neurosurgery  Procedures:None  Antimicrobials:  Anti-infectives (From admission, onward)   Start     Dose/Rate Route Frequency Ordered Stop   11/29/19 1700  penicillin G potassium 12 Million Units in dextrose 5 % 500 mL continuous  infusion        12 Million Units 41.7 mL/hr over 12 Hours Intravenous Every 12 hours 11/29/19 1539     11/24/19 1530  cefTRIAXone (ROCEPHIN) 2 g in sodium chloride 0.9 % 100 mL IVPB  Status:  Discontinued        2 g 200 mL/hr over 30 Minutes Intravenous Every 12 hours 11/24/19 1522 11/29/19 1539    11/24/19 1400  ceFEPIme (MAXIPIME) 2 g in sodium chloride 0.9 % 100 mL IVPB  Status:  Discontinued        2 g 200 mL/hr over 30 Minutes Intravenous Every 8 hours 11/24/19 0859 11/24/19 1522   11/23/19 2200  vancomycin (VANCOREADY) IVPB 1750 mg/350 mL  Status:  Discontinued        1,750 mg 175 mL/hr over 120 Minutes Intravenous Every 24 hours 11/23/19 0917 11/24/19 1013   11/23/19 1800  ceFEPIme (MAXIPIME) 2 g in sodium chloride 0.9 % 100 mL IVPB  Status:  Discontinued        2 g 200 mL/hr over 30 Minutes Intravenous Every 12 hours 11/23/19 0924 11/24/19 0859   11/23/19 0400  aztreonam (AZACTAM) 2 g in sodium chloride 0.9 % 100 mL IVPB  Status:  Discontinued        2 g 200 mL/hr over 30 Minutes Intravenous Every 8 hours 11/22/19 1719 11/23/19 0921   11/22/19 1730  vancomycin (VANCOREADY) IVPB 1500 mg/300 mL        1,500 mg 150 mL/hr over 120 Minutes Intravenous  Once 11/22/19 1715 11/22/19 2024   11/22/19 1718  vancomycin variable dose per unstable renal function (pharmacist dosing)  Status:  Discontinued         Does not apply See admin instructions 11/22/19 1719 11/24/19 0859   11/22/19 1645  aztreonam (AZACTAM) 2 g in sodium chloride 0.9 % 100 mL IVPB        2 g 200 mL/hr over 30 Minutes Intravenous  Once 11/22/19 1636 11/22/19 2116   11/22/19 1645  metroNIDAZOLE (FLAGYL) IVPB 500 mg        500 mg 100 mL/hr over 60 Minutes Intravenous  Once 11/22/19 1636 11/22/19 1813   11/22/19 1645  vancomycin (VANCOCIN) IVPB 1000 mg/200 mL premix        1,000 mg 200 mL/hr over 60 Minutes Intravenous  Once 11/22/19 1636 11/22/19 1813      Subjective:  Patient seen and examined at bedside this morning.  Medically stable.  Sitting in the chair.  Wife at the bedside.  On 3 days of oxygen per minute.  Comfortable, denies any complaints.  Objective: Vitals:   12/07/19 0508 12/07/19 0758 12/07/19 0856 12/07/19 1218  BP: 100/68  (!) 103/52 (!) 89/64  Pulse: 64 73 75 75  Resp: 15 (!) 25 (!) 23 20    Temp: 98.2 F (36.8 C)  98.3 F (36.8 C) 98.8 F (37.1 C)  TempSrc: Axillary  Oral Oral  SpO2: 99% 92% 92% 95%  Weight: 128.6 kg     Height:        Intake/Output Summary (Last 24 hours) at 12/07/2019 1355 Last data filed at 12/07/2019 0856 Gross per 24 hour  Intake 840 ml  Output 680 ml  Net 160 ml   Filed Weights   12/05/19 0347 12/06/19 0411 12/07/19 0508  Weight: 124.3 kg 127.7 kg 128.6 kg    Examination:  General exam: Morbidly obese, comfortable Respiratory system: Bilateral diminished breath sounds  cardiovascular system: S1 & S2 heard, RRR. No  JVD, murmurs, rubs, gallops or clicks. No pedal edema. Gastrointestinal system: Abdomen is distended, soft and nontender. No organomegaly or masses felt. Normal bowel sounds heard. Central nervous system: Alert and oriented. No focal neurological deficits. Extremities: Trace bilateral lower extremity edema, no clubbing ,no cyanosis Skin: No rashes, lesions or ulcers,no icterus ,no pallor   Data Reviewed: I have personally reviewed following labs and imaging studies  CBC: Recent Labs  Lab 12/01/19 0507  WBC 8.6  NEUTROABS 6.4  HGB 9.9*  HCT 31.8*  MCV 95.8  PLT 338   Basic Metabolic Panel: Recent Labs  Lab 12/03/19 0609 12/04/19 0500 12/05/19 0500 12/06/19 0708 12/07/19 0619  NA 131* 136 128* 134* 136  K 5.4* 4.6 5.8* 5.7* 4.1  CL 89* 94* 89* 93* 97*  CO2 35* 37* 33* 34* 36*  GLUCOSE 270* 178* 330* 282* 119*  BUN 10 12 14 15 16   CREATININE 0.86 0.75 0.99 0.85 0.81  CALCIUM 8.8* 9.3 8.9 9.0 9.1   GFR: Estimated Creatinine Clearance: 112.6 mL/min (by C-G formula based on SCr of 0.81 mg/dL). Liver Function Tests: No results for input(s): AST, ALT, ALKPHOS, BILITOT, PROT, ALBUMIN in the last 168 hours. No results for input(s): LIPASE, AMYLASE in the last 168 hours. No results for input(s): AMMONIA in the last 168 hours. Coagulation Profile: No results for input(s): INR, PROTIME in the last 168  hours. Cardiac Enzymes: No results for input(s): CKTOTAL, CKMB, CKMBINDEX, TROPONINI in the last 168 hours. BNP (last 3 results) No results for input(s): PROBNP in the last 8760 hours. HbA1C: No results for input(s): HGBA1C in the last 72 hours. CBG: Recent Labs  Lab 12/06/19 1153 12/06/19 1640 12/06/19 2140 12/07/19 0839 12/07/19 1215  GLUCAP 140* 147* 134* 192* 93   Lipid Profile: No results for input(s): CHOL, HDL, LDLCALC, TRIG, CHOLHDL, LDLDIRECT in the last 72 hours. Thyroid Function Tests: No results for input(s): TSH, T4TOTAL, FREET4, T3FREE, THYROIDAB in the last 72 hours. Anemia Panel: No results for input(s): VITAMINB12, FOLATE, FERRITIN, TIBC, IRON, RETICCTPCT in the last 72 hours. Sepsis Labs: No results for input(s): PROCALCITON, LATICACIDVEN in the last 168 hours.  No results found for this or any previous visit (from the past 240 hour(s)).       Radiology Studies: No results found.      Scheduled Meds: . arformoterol  15 mcg Nebulization BID  . budesonide (PULMICORT) nebulizer solution  0.5 mg Nebulization BID  . Chlorhexidine Gluconate Cloth  6 each Topical Daily  . gabapentin  100 mg Oral TID  . heparin  5,000 Units Subcutaneous Q8H  . hydrOXYzine  25 mg Oral Once  . insulin aspart  0-20 Units Subcutaneous TID WC  . ipratropium-albuterol  3 mL Nebulization BID  . mouth rinse  15 mL Mouth Rinse BID  . melatonin  3 mg Oral QHS  . senna-docusate  2 tablet Oral BID  . sodium chloride flush  10-40 mL Intracatheter Q12H   Continuous Infusions: . sodium chloride 20 mL (12/05/19 0830)  . penicillin g continuous IV infusion 12 Million Units (12/07/19 0752)     LOS: 15 days    Time spent: 25 mins,More than 50% of that time was spent in counseling and/or coordination of care.      02/06/20, MD [facility] P9/10/2019, 1:55 PM

## 2019-12-08 LAB — CBC WITH DIFFERENTIAL/PLATELET
Abs Immature Granulocytes: 0.01 10*3/uL (ref 0.00–0.07)
Basophils Absolute: 0 10*3/uL (ref 0.0–0.1)
Basophils Relative: 1 %
Eosinophils Absolute: 0.3 10*3/uL (ref 0.0–0.5)
Eosinophils Relative: 8 %
HCT: 29.5 % — ABNORMAL LOW (ref 39.0–52.0)
Hemoglobin: 9 g/dL — ABNORMAL LOW (ref 13.0–17.0)
Immature Granulocytes: 0 %
Lymphocytes Relative: 26 %
Lymphs Abs: 1.1 10*3/uL (ref 0.7–4.0)
MCH: 30.1 pg (ref 26.0–34.0)
MCHC: 30.5 g/dL (ref 30.0–36.0)
MCV: 98.7 fL (ref 80.0–100.0)
Monocytes Absolute: 0.4 10*3/uL (ref 0.1–1.0)
Monocytes Relative: 9 %
Neutro Abs: 2.5 10*3/uL (ref 1.7–7.7)
Neutrophils Relative %: 56 %
Platelets: 290 10*3/uL (ref 150–400)
RBC: 2.99 MIL/uL — ABNORMAL LOW (ref 4.22–5.81)
RDW: 15.9 % — ABNORMAL HIGH (ref 11.5–15.5)
WBC: 4.4 10*3/uL (ref 4.0–10.5)
nRBC: 0 % (ref 0.0–0.2)

## 2019-12-08 LAB — GLUCOSE, CAPILLARY
Glucose-Capillary: 126 mg/dL — ABNORMAL HIGH (ref 70–99)
Glucose-Capillary: 141 mg/dL — ABNORMAL HIGH (ref 70–99)
Glucose-Capillary: 135 mg/dL — ABNORMAL HIGH (ref 70–99)
Glucose-Capillary: 130 mg/dL — ABNORMAL HIGH (ref 70–99)

## 2019-12-08 LAB — BASIC METABOLIC PANEL
Anion gap: 5 (ref 5–15)
BUN: 14 mg/dL (ref 8–23)
CO2: 35 mmol/L — ABNORMAL HIGH (ref 22–32)
Calcium: 9.1 mg/dL (ref 8.9–10.3)
Chloride: 98 mmol/L (ref 98–111)
Creatinine, Ser: 0.83 mg/dL (ref 0.61–1.24)
GFR calc Af Amer: >60 mL/min (ref >60)
GFR calc non Af Amer: >60 mL/min (ref >60)
Glucose, Bld: 126 mg/dL — ABNORMAL HIGH (ref 70–99)
Potassium: 4.4 mmol/L (ref 3.5–5.1)
Sodium: 138 mmol/L (ref 135–145)

## 2019-12-08 NOTE — Progress Notes (Signed)
Physical Therapy Treatment Patient Details Name: Kevin [name]. MRN: [id number] DOB: 10-17-1950 Today's Date: 12/08/2019    History of Present Illness 69 year old male brought to the emergency room for evaluation of a 1 to 1-1/2-week history of worsening lethargy malaise decreased p.o. intake. 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 Pt found to have acute encephalopathy in setting of hypercarbia, sepsis    PT Comments    Pt sitting up in chair, obviously pleased to be clean shaven after working with OT earlier. Pt agreeable to working with PT and is making great progress towards his supplemental O2 need and mobility goals. Pt is limited in safe mobility, by anxiety with 3/4 DoE and decreased endurance. Pt is supervision for transfers and min guard for ambulation and return to bed. Pt able to maintain SaO2 >90%O2 throughout session. Pt has strong desire to return to [facility] and is always willing to participate with therapy. PT continues to recommend CIR level rehab at discharge. PT will continue to follow acutely.    Follow Up Recommendations  CIR     Equipment Recommendations  Rolling walker with 5" wheels;3in1 (PT)       Precautions / Restrictions Precautions Precautions: Fall Precaution Comments: monitor SpO2  Restrictions Weight Bearing Restrictions: No    Mobility  Bed Mobility Overal bed mobility: Needs Assistance Bed Mobility: Sit to Supine       Sit to supine: Min guard   General bed mobility comments: min guard for safety with bringing LE back into bed   Transfers Overall transfer level: Needs assistance Equipment used: Rolling walker (2 wheeled) Transfers: Sit to/from Stand Sit to Stand: Supervision         General transfer comment: good power up and steadying in RW  Ambulation/Gait Ambulation/Gait assistance: Min guard Gait Distance (Feet): 20 Feet Assistive device:  Rolling walker (2 wheeled) Gait Pattern/deviations: Decreased stride length;Shuffle;Trunk flexed;Step-to pattern Gait velocity: slowed    General Gait Details: min guard for safety and management of line, vc for proximity to RW, pt with increased work of breathing at end of ambulation, vc for constant gait speed not speeding up when he feels winded          Balance Overall balance assessment: Needs assistance Sitting-balance support: Bilateral upper extremity supported Sitting balance-Leahy Scale: Fair Sitting balance - Comments: initially requires modA to maintain seated balance with sitting duration able to place UE in lap and maintain balance.    Standing balance support: Bilateral upper extremity supported;During functional activity Standing balance-Leahy Scale: Poor Standing balance comment: reliant on UE support on RW                             Cognition Arousal/Alertness: Awake/alert Behavior During Therapy: WFL for tasks assessed/performed Overall Cognitive Status: Within Functional Limits for tasks assessed                                           General Comments General comments (skin integrity, edema, etc.): Pt on 2L O2 via [place] and able to maintain SaO2 >90%O2 throughout ambulation, pt did experience 3/4 DoE and increased anxiety due to preceived lack of oxygen       Pertinent Vitals/Pain Pain Assessment: No/denies pain  PT Goals (current goals can now be found in the care plan section) Acute Rehab PT Goals Patient Stated Goal: get back home PT Goal Formulation: With patient Time For Goal Achievement: 12/10/19 Potential to Achieve Goals: Good Progress towards PT goals: Progressing toward goals    Frequency    Min 3X/week      PT Plan Current plan remains appropriate       AM-PAC PT "6 Clicks" Mobility   Outcome Measure  Help needed turning from your back to your side while in a flat bed without using  bedrails?: A Little Help needed moving from lying on your back to sitting on the side of a flat bed without using bedrails?: A Little Help needed moving to and from a bed to a chair (including a wheelchair)?: A Little Help needed standing up from a chair using your arms (e.g., wheelchair or bedside chair)?: A Little Help needed to walk in hospital room?: A Little Help needed climbing 3-5 steps with a railing? : Total 6 Click Score: 16    End of Session Equipment Utilized During Treatment: Oxygen;Gait belt Activity Tolerance: Patient tolerated treatment well Patient left: with call bell/phone within reach;in bed Nurse Communication: Mobility status PT Visit Diagnosis: Unsteadiness on feet (R26.81);Other abnormalities of gait and mobility (R26.89);Muscle weakness (generalized) (M62.81)     Time: 5056-9794 PT Time Calculation (min) (ACUTE ONLY): 19 min  Charges:  $Gait Training: 8-22 mins                     Devoiry Corriher B. Beverely Risen PT, DPT Acute Rehabilitation Services Pager [phone] Office [phone]    Elon Alas Fleet 12/08/2019, 2:38 PM

## 2019-12-08 NOTE — Progress Notes (Signed)
Pt is off BIPAP at this moment, he said he would like to go on it later. BIPAP SB. RT will continue to monitor.

## 2019-12-08 NOTE — Progress Notes (Signed)
PROGRESS NOTE    Kevin L Jenifer Jr.  GHW:[id number] DOB: Nov 12, 1950 DOA: 11/22/2019 PCP: Malka So., MD   Brief Narrative:  Patient is a 69 year old male with history of hypertension, morbid obesity, type 2 diabetes mellitus, asthma, hyperlipidemia, heart block status post pacemaker placement, OSA who presents from home with 1 week history of decreased appetite, poor oral intake, diarrhea, abdomen/chest pain.  On presentation, he was found to be in septic shock and was started on vasopressors and was transferred to ICU.  Work-up revealed cervical ventral epidural abscess.  Blood cultures showed Streptococcus intermedius.  ID, neurosurgery consulted.  He was transferred to [facility] service on 11/30/2019.  Hospital course complicated by acute hypoxic respiratory failure secondary to pulmonary edema requiring up to 5 to 10 L of high flow nasal cannula.  Received Lasix for volume overload.  His respiratory status has  improved.  PT/OT recommending CIR on discharge.  Patient is stable for discharge to CIR as soon as bed is able.  Assessment & Plan:   Active Problems:   Hypotension   Pressure injury of skin   Palliative care by specialist   Goals of care, counseling/discussion   DNR (do not resuscitate) discussion   Hypoxia   Acute hypoxemic respiratory failure (HCC)   Weakness generalized   Acute respiratory failure with hypoxia/hypercarbia: Multifactorial secondary atelectasis, acute pulmonary edema, pleural effusions on the background of OSA/OSH.  Not on oxygen at home.  He required high flow oxygen up to 15 L/min.  Oxygen requirement has improved and he is feeling more comfortable.  Today he was on 3 L of oxygen per minute.  CTA of the chest did not show any PE.  Continue to wean the oxygen.  He was not on oxygen at home. Encourage BiPAP at night.  Septic shock with strep bacteremia: Secondary to cervical ventral epidural abscess.  Blood cultures showed Streptococcus intermedius.  ID was  following.  Currently on IV penicillin for total of 8-week duration with end date of 01/16/2020. TEE done on 8/30 did not show any evidence of endocarditis or pacemaker wire infection.  Cervical ventral epidural abscess: Neurosurgery was following.  Plan is to repeat MRI in 2 weeks on 12/14/2019.  Continue pain management.  On gabapentin.  History of OSA/possible OHS: Noncompliant with CPAP at home.  We will recommend to do a sleep study as an outpatient for initiation of CPAP.  Diabetes type 2 with hyperglycemia: Hemoglobin A1c of 7.1.  Continue current insulin regimen.  Morbid obesity: BMI of 41.  Recommend weight loss, balanced diet  Sleep apnea: Has CPAP at home but does not use often due to claustrophobia.  We encouraged him to use CPAP machine at home.  Stage II sacral ulcer: Present on admission.  Continue local wound care.  Constipation: Continue bowel regimen  Goals of care: Palliative care was following the patient.  Partial code.  No CPR/intubation but okay to ACLS medications/vasopressors.  Deconditioning/debility: PT/OT recommended CIR.               DVT prophylaxis:Heparin [place] Code Status: Partial Family Communication: Kevin [name] at the bedside on 12/07/19 Status is: Inpatient  Remains inpatient appropriate because:Inpatient level of care appropriate due to severity of illness   Dispo:  Patient From: Home  Planned Disposition: Inpatient Rehab  Expected discharge date: 12/09/19  Medically stable for discharge: Yes!     Consultants: ID,neurosurgery  Procedures:None  Antimicrobials:  Anti-infectives (From admission, onward)   Start     Dose/Rate Route  Frequency Ordered Stop   11/29/19 1700  penicillin G potassium 12 Million Units in dextrose 5 % 500 mL continuous infusion        12 Million Units 41.7 mL/hr over 12 Hours Intravenous Every 12 hours 11/29/19 1539     11/24/19 1530  cefTRIAXone (ROCEPHIN) 2 g in sodium chloride 0.9 % 100 mL IVPB  Status:   Discontinued        2 g 200 mL/hr over 30 Minutes Intravenous Every 12 hours 11/24/19 1522 11/29/19 1539   11/24/19 1400  ceFEPIme (MAXIPIME) 2 g in sodium chloride 0.9 % 100 mL IVPB  Status:  Discontinued        2 g 200 mL/hr over 30 Minutes Intravenous Every 8 hours 11/24/19 0859 11/24/19 1522   11/23/19 2200  vancomycin (VANCOREADY) IVPB 1750 mg/350 mL  Status:  Discontinued        1,750 mg 175 mL/hr over 120 Minutes Intravenous Every 24 hours 11/23/19 0917 11/24/19 1013   11/23/19 1800  ceFEPIme (MAXIPIME) 2 g in sodium chloride 0.9 % 100 mL IVPB  Status:  Discontinued        2 g 200 mL/hr over 30 Minutes Intravenous Every 12 hours 11/23/19 0924 11/24/19 0859   11/23/19 0400  aztreonam (AZACTAM) 2 g in sodium chloride 0.9 % 100 mL IVPB  Status:  Discontinued        2 g 200 mL/hr over 30 Minutes Intravenous Every 8 hours 11/22/19 1719 11/23/19 0921   11/22/19 1730  vancomycin (VANCOREADY) IVPB 1500 mg/300 mL        1,500 mg 150 mL/hr over 120 Minutes Intravenous  Once 11/22/19 1715 11/22/19 2024   11/22/19 1718  vancomycin variable dose per unstable renal function (pharmacist dosing)  Status:  Discontinued         Does not apply See admin instructions 11/22/19 1719 11/24/19 0859   11/22/19 1645  aztreonam (AZACTAM) 2 g in sodium chloride 0.9 % 100 mL IVPB        2 g 200 mL/hr over 30 Minutes Intravenous  Once 11/22/19 1636 11/22/19 2116   11/22/19 1645  metroNIDAZOLE (FLAGYL) IVPB 500 mg        500 mg 100 mL/hr over 60 Minutes Intravenous  Once 11/22/19 1636 11/22/19 1813   11/22/19 1645  vancomycin (VANCOCIN) IVPB 1000 mg/200 mL premix        1,000 mg 200 mL/hr over 60 Minutes Intravenous  Once 11/22/19 1636 11/22/19 1813      Subjective:  Patient seen and examined the bedside this morning.  Comfortable.  Hemodynamically stable.  On 4 L of oxygen this morning.  Denies any shortness of breath or chest pain.  Sitting in the chair..  Objective: Vitals:   12/08/19 0019 12/08/19  0226 12/08/19 0425 12/08/19 0730  BP: 104/64  114/67 93/80  Pulse:  65 61 75  Resp:  16 15   Temp: 97.9 F (36.6 C)  98.5 F (36.9 C) 98 F (36.7 C)  TempSrc:   Axillary Oral  SpO2:   96%   Weight:   128.5 kg   Height:        Intake/Output Summary (Last 24 hours) at 12/08/2019 0818 Last data filed at 12/07/2019 2000 Gross per 24 hour  Intake 4350.28 ml  Output 650 ml  Net 3700.28 ml   Filed Weights   12/06/19 0411 12/07/19 0508 12/08/19 0425  Weight: 127.7 kg 128.6 kg 128.5 kg    Examination:    General  exam: Comfortable, morbidly obese  Respiratory system: Bilateral equal air entry, normal vesicular breath sounds, no wheezes or crackles  Cardiovascular system: S1 & S2 heard, RRR. No JVD, murmurs, rubs, gallops or clicks. Gastrointestinal system: Abdomen is nondistended, soft and nontender. No organomegaly or masses felt. Normal bowel sounds heard. Central nervous system: Alert and oriented. No focal neurological deficits. Extremities: Trace bilateral lower extremity edema, no clubbing ,no cyanosis Skin: No rashes, lesions or ulcers,no icterus ,no pallor   Data Reviewed: I have personally reviewed following labs and imaging studies  CBC: Recent Labs  Lab 12/08/19 0427  WBC 4.4  NEUTROABS 2.5  HGB 9.0*  HCT 29.5*  MCV 98.7  PLT 290   Basic Metabolic Panel: Recent Labs  Lab 12/04/19 0500 12/05/19 0500 12/06/19 0708 12/07/19 0619 12/08/19 0427  NA 136 128* 134* 136 138  K 4.6 5.8* 5.7* 4.1 4.4  CL 94* 89* 93* 97* 98  CO2 37* 33* 34* 36* 35*  GLUCOSE 178* 330* 282* 119* 126*  BUN 12 14 15 16 14   CREATININE 0.75 0.99 0.85 0.81 0.83  CALCIUM 9.3 8.9 9.0 9.1 9.1   GFR: Estimated Creatinine Clearance: 109.9 mL/min (by C-G formula based on SCr of 0.83 mg/dL). Liver Function Tests: No results for input(s): AST, ALT, ALKPHOS, BILITOT, PROT, ALBUMIN in the last 168 hours. No results for input(s): LIPASE, AMYLASE in the last 168 hours. No results for input(s):  AMMONIA in the last 168 hours. Coagulation Profile: No results for input(s): INR, PROTIME in the last 168 hours. Cardiac Enzymes: No results for input(s): CKTOTAL, CKMB, CKMBINDEX, TROPONINI in the last 168 hours. BNP (last 3 results) No results for input(s): PROBNP in the last 8760 hours. HbA1C: No results for input(s): HGBA1C in the last 72 hours. CBG: Recent Labs  Lab 12/07/19 0839 12/07/19 1215 12/07/19 1623 12/07/19 2057 12/08/19 0725  GLUCAP 192* 93 171* 145* 126*   Lipid Profile: No results for input(s): CHOL, HDL, LDLCALC, TRIG, CHOLHDL, LDLDIRECT in the last 72 hours. Thyroid Function Tests: No results for input(s): TSH, T4TOTAL, FREET4, T3FREE, THYROIDAB in the last 72 hours. Anemia Panel: No results for input(s): VITAMINB12, FOLATE, FERRITIN, TIBC, IRON, RETICCTPCT in the last 72 hours. Sepsis Labs: No results for input(s): PROCALCITON, LATICACIDVEN in the last 168 hours.  No results found for this or any previous visit (from the past 240 hour(s)).       Radiology Studies: No results found.      Scheduled Meds: . arformoterol  15 mcg Nebulization BID  . budesonide (PULMICORT) nebulizer solution  0.5 mg Nebulization BID  . Chlorhexidine Gluconate Cloth  6 each Topical Daily  . gabapentin  100 mg Oral TID  . heparin  5,000 Units Subcutaneous Q8H  . hydrOXYzine  25 mg Oral Once  . insulin aspart  0-20 Units Subcutaneous TID WC  . ipratropium-albuterol  3 mL Nebulization BID  . mouth rinse  15 mL Mouth Rinse BID  . melatonin  3 mg Oral QHS  . senna-docusate  2 tablet Oral BID  . sodium chloride flush  10-40 mL Intracatheter Q12H   Continuous Infusions: . sodium chloride Stopped (12/05/19 1015)  . penicillin g continuous IV infusion 12 Million Units (12/07/19 2114)     LOS: 16 days    Time spent: 25 mins,More than 50% of that time was spent in counseling and/or coordination of care.      2115, MD [facility] P9/11/2019, 8:18  AM

## 2019-12-08 NOTE — Progress Notes (Signed)
Patient placed on BIPAP and he is tolerating it will. Vital signs are stable. RT will continue to monitor.

## 2019-12-08 NOTE — Progress Notes (Signed)
Inpatient Rehabilitation Admissions Coordinator  I await updated PT and OT treatments today to begin insurance authorization with [facility] medicare for a possible Cir admit pending their approval.  Ottie Glazier, RN, MSN Rehab Admissions Coordinator [phone] 12/08/2019 10:42 AM

## 2019-12-08 NOTE — Progress Notes (Signed)
   12/08/19 1497  Clinical Encounter Type  Visited With Patient  Visit Type Follow-up (Advance Directive)  Referral From Palliative care team  Consult/Referral To Chaplain  This chaplain followed up with Pt. spiritual care.  The chaplain communicated a notary for completing his AD is not available today the chaplain will revisit notary availability on Friday. The chaplain informed the Pt. his wife doesn't need to be present for the signing of the document.    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 The chaplain learned from communication with the Pt. he is hopeful to be home with family for the holidays and is willing to hold CIR one day at a time.  This chaplain is available for F/U spiritual care as needed.

## 2019-12-08 NOTE — Progress Notes (Signed)
Occupational Therapy Treatment Patient Details Name: Kevin [name]. MRN: [id number] DOB: 1950-09-24 Today's Date: 12/08/2019    History of present illness 69 year old male brought to the emergency room for evaluation of a 1 to 1-1/2-week history of worsening lethargy malaise decreased p.o. intake. 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 Pt found to have acute encephalopathy in setting of hypercarbia, sepsis   OT comments  Patient is currently on 2 L via [place] with sats maintaining 90-94% with activity.  Patient continues to improve with his overall functional status.  He is moving in the room with less assist, and increasing his participation with his own self care.  He still presents with significant declines to mobility and self care compared to his prior level of function, but he  has been progressed to sink side self care.  He has excellent participation and a strong desire to improve his independence.  O2 dependency is declining and his activity tolerance is improving (2/4 dyspnea with exertion).  OT to continue to follow in the acute setting.  Patient would benefit from a higher intensity rehab, and is demonstrating the ability to participate.  His goal is to return home at his prior level.      Follow Up Recommendations  CIR    Equipment Recommendations       Recommendations for Other Services      Precautions / Restrictions Precautions Precautions: Fall Precaution Comments: monitor SpO2 decreased to 2 L via [place] Restrictions Weight Bearing Restrictions: No          Balance Overall balance assessment: Needs assistance Sitting-balance support: No upper extremity supported Sitting balance-Leahy Scale: Good     Standing balance support: Bilateral upper extremity supported;During functional activity Standing balance-Leahy Scale: Fair                             ADL either  performed or assessed with clinical judgement   ADL Overall ADL's : Needs assistance/impaired Eating/Feeding: Independent;Sitting   Grooming: Sitting;Min guard Grooming Details (indicate cue type and reason): safety with lines and O2 sats. Upper Body Bathing: Minimal assistance;Sitting   Lower Body Bathing: Moderate assistance;Sit to/from stand Lower Body Bathing Details (indicate cue type and reason): difficulty reach B feet sitting in chair. Upper Body Dressing : Moderate assistance;Sitting Upper Body Dressing Details (indicate cue type and reason): lines and leads impact his ability to perform this in the acute setting. Lower Body Dressing: Sit to/from stand;Minimal assistance Lower Body Dressing Details (indicate cue type and reason): increased effort and time to donn socks.             Functional mobility during ADLs: Min guard;Rolling walker                             Pertinent Vitals/ Pain       Pain Assessment: No/denies pain  Home Living                                                        Frequency  Min 2X/week        Progress Toward Goals  OT Goals(current goals can now be found in the care plan  section)  Progress towards OT goals: Progressing toward goals  Acute Rehab OT Goals Patient Stated Goal: I want to get back to taking care of myself.  I'm rady to get moving. OT Goal Formulation: With patient Time For Goal Achievement: 12/22/19 Potential to Achieve Goals: Good  Plan Discharge plan remains appropriate    Co-evaluation                 AM-PAC OT "6 Clicks" Daily Activity     Outcome Measure   Help from another person eating meals?: None Help from another person taking care of personal grooming?: A Little Help from another person toileting, which includes using toliet, bedpan, or urinal?: A Little Help from another person bathing (including washing, rinsing, drying)?: A Lot Help from another person  to put on and taking off regular upper body clothing?: A Little Help from another person to put on and taking off regular lower body clothing?: A Little 6 Click Score: 18    End of Session Equipment Utilized During Treatment: Rolling walker  OT Visit Diagnosis: Unsteadiness on feet (R26.81);Other abnormalities of gait and mobility (R26.89);Muscle weakness (generalized) (M62.81);Pain;Other symptoms and signs involving cognitive function   Activity Tolerance Patient tolerated treatment well;Other (comment) (mild complaints of SOB)   Patient Left in chair;with call bell/phone within reach;with nursing/sitter in room   Nurse Communication          Time: 4742-5956 OT Time Calculation (min): 28 min  Charges: OT General Charges $OT Visit: 1 Visit OT Treatments $Self Care/Home Management : 23-37 mins  12/08/2019  Rich, OTR/L  Acute Rehabilitation Services  Office:  [phone]    Suzanna Obey 12/08/2019, 12:24 PM

## 2019-12-09 LAB — BASIC METABOLIC PANEL
Anion gap: 7 (ref 5–15)
BUN: 14 mg/dL (ref 8–23)
CO2: 32 mmol/L (ref 22–32)
Calcium: 8.9 mg/dL (ref 8.9–10.3)
Chloride: 95 mmol/L — ABNORMAL LOW (ref 98–111)
Creatinine, Ser: 0.96 mg/dL (ref 0.61–1.24)
GFR calc Af Amer: >60 mL/min (ref >60)
GFR calc non Af Amer: >60 mL/min (ref >60)
Glucose, Bld: 233 mg/dL — ABNORMAL HIGH (ref 70–99)
Potassium: 5.7 mmol/L — ABNORMAL HIGH (ref 3.5–5.1)
Sodium: 134 mmol/L — ABNORMAL LOW (ref 135–145)

## 2019-12-09 LAB — GLUCOSE, CAPILLARY
Glucose-Capillary: 125 mg/dL — ABNORMAL HIGH (ref 70–99)
Glucose-Capillary: 171 mg/dL — ABNORMAL HIGH (ref 70–99)
Glucose-Capillary: 148 mg/dL — ABNORMAL HIGH (ref 70–99)
Glucose-Capillary: 137 mg/dL — ABNORMAL HIGH (ref 70–99)

## 2019-12-09 LAB — POTASSIUM
Potassium: 5.9 mmol/L — ABNORMAL HIGH (ref 3.5–5.1)
Potassium: 6 mmol/L — ABNORMAL HIGH (ref 3.5–5.1)

## 2019-12-09 MED ORDER — HYDROCHLOROTHIAZIDE 25 MG PO TABS
25.0000 mg | ORAL_TABLET | Freq: Every morning | ORAL | Status: DC
  Administered 2019-12-10: 25 mg via ORAL
  Filled 2019-12-09: qty 1

## 2019-12-09 MED ORDER — INSULIN ASPART 100 UNIT/ML IV SOLN
10.0000 [IU] | Freq: Once | INTRAVENOUS | Status: AC
  Administered 2019-12-09: 10 [IU] via INTRAVENOUS

## 2019-12-09 MED ORDER — CALCIUM GLUCONATE-NACL 1-0.675 GM/50ML-% IV SOLN
1.0000 g | Freq: Once | INTRAVENOUS | Status: AC
  Administered 2019-12-09: 1000 mg via INTRAVENOUS
  Filled 2019-12-09: qty 50

## 2019-12-09 MED ORDER — DEXTROSE 50 % IV SOLN
50.0000 mL | Freq: Once | INTRAVENOUS | Status: AC
  Administered 2019-12-09: 50 mL via INTRAVENOUS
  Filled 2019-12-09: qty 50

## 2019-12-09 MED ORDER — SODIUM ZIRCONIUM CYCLOSILICATE 10 G PO PACK
10.0000 g | PACK | Freq: Two times a day (BID) | ORAL | Status: DC
  Administered 2019-12-09: 10 g via ORAL
  Filled 2019-12-09 (×2): qty 1

## 2019-12-09 MED ORDER — SODIUM POLYSTYRENE SULFONATE 15 GM/60ML PO SUSP
45.0000 g | Freq: Once | ORAL | Status: AC
  Administered 2019-12-09: 45 g via ORAL
  Filled 2019-12-09: qty 180

## 2019-12-09 NOTE — Progress Notes (Signed)
Inpatient Rehabilitation Admissions Coordinator  I await insurance authorization and bed available to admit to CIR. I will not have bed over the weekend nor insurance determination. I will alert TOC.  Ottie Glazier, RN, MSN Rehab Admissions Coordinator [phone] 12/09/2019 2:08 PM

## 2019-12-09 NOTE — NC FL2 (Signed)
[place] MEDICAID FL2 LEVEL OF CARE SCREENING TOOL     IDENTIFICATION  Patient Name: Kevin [name]. Birthdate: 02-26-1951 Sex: male Admission Date (Current Location): 11/22/2019  [facility] and [place] Number:  Producer, television/film/video and Address:  [facility] [place], [address] [place], [place] [address]      Provider Number: [id number]  Attending Physician Name and Address:  Burnadette Pop, MD  Relative Name and Phone Number:  Jola Babinski [phone]    Current Level of Care: Hospital Recommended Level of Care: Skilled Nursing Facility Prior Approval Number:    Date Approved/Denied:   PASRR Number: [id number]  Discharge Plan: SNF    Current Diagnoses: Patient Active Problem List   Diagnosis Date Noted  . Hypoxia   . Acute hypoxemic respiratory failure (HCC)   . Weakness generalized   . Palliative care by specialist   . Goals of care, counseling/discussion   . DNR (do not resuscitate) discussion   . Pressure injury of skin 11/26/2019  . Hypotension 11/22/2019    Orientation RESPIRATION BLADDER Height & Weight     Self, Time, Situation, Place  O2 (Nasal Cannula 2 liters) Continent Weight: 281 lb 1.6 oz (127.5 kg) Height:  5\' 10"  (177.8 cm)  BEHAVIORAL SYMPTOMS/MOOD NEUROLOGICAL BOWEL NUTRITION STATUS      Continent Diet (See Discharge Summary)  AMBULATORY STATUS COMMUNICATION OF NEEDS Skin   Limited Assist Verbally Surgical wounds, Skin abrasions, Other (Comment) (Approp for eth,dry,ecchymosis arm;abdomen;anterior;Right,Abrasion nose mid,non-tenting,PI Sacrum medial;lower stage 2,Wound/incision(openordehisced)(MASD)Bilateral,Wound/Incision(Openordehisced)(MASD)ScrotumRight;Mid)                       Personal Care Assistance Level of Assistance  Bathing, Feeding, Dressing Bathing Assistance: Limited assistance Feeding assistance: Independent (Able to feed self;Carb modified) Dressing Assistance: Limited assistance     Functional  Limitations Info  Sight, Hearing, Speech Sight Info: Adequate Hearing Info: Adequate Speech Info: Adequate    SPECIAL CARE FACTORS FREQUENCY  PT (By licensed PT), OT (By licensed OT)     PT Frequency: 5x min weekly OT Frequency: 5x min weekly            Contractures Contractures Info: Not present    Additional Factors Info  Code Status, Allergies, Insulin Sliding Scale Code Status Info: Partial Allergies Info: Benazepril,Ibuprofen,Mirabegron,Amlodipine,Cephalexin,Molds & Smuts,Oxycodone,Percodan oxycodone-aspirin   Insulin Sliding Scale Info: insulin aspart (novoLOG) injection 0-20 Units 3 times daily with meals       Current Medications (12/09/2019):  This is the current hospital active medication list Current Facility-Administered Medications  Medication Dose Route Frequency Provider Last Rate Last Admin  . 0.9 %  sodium chloride infusion  250 mL Intravenous Continuous 02/08/2020, MD   Stopped at 12/05/19 1015  . acetaminophen (TYLENOL) tablet 650 mg  650 mg Oral Q6H PRN 02/04/20, MD   650 mg at 12/08/19 1710  . albuterol (PROVENTIL) (2.5 MG/3ML) 0.083% nebulizer solution 2.5 mg  2.5 mg Nebulization Q4H PRN Zierle-Ghosh, Asia B, DO      . arformoterol (BROVANA) nebulizer solution 15 mcg  15 mcg Nebulization BID 02/07/20, MD   15 mcg at 12/09/19 1059  . bisacodyl (DULCOLAX) suppository 10 mg  10 mg Rectal Daily PRN 02/08/20 N, DO      . budesonide (PULMICORT) nebulizer solution 0.5 mg  0.5 mg Nebulization BID Dow Adolph, MD   0.5 mg at 12/09/19 0853  . Chlorhexidine Gluconate Cloth 2 % PADS 6 each  6 each Topical Daily Agarwala,  Daleen Bo, MD   6 each at 12/09/19 0720  . diclofenac Sodium (VOLTAREN) 1 % topical gel 2-4 g  2-4 g Topical QID PRN Lynnell Catalan, MD   4 g at 12/08/19 2213  . diphenhydrAMINE (BENADRYL) capsule 25 mg  25 mg Oral Q8H PRN Zierle-Ghosh, Asia B, DO      . gabapentin (NEURONTIN) capsule 100 mg  100 mg Oral TID Dow Adolph N, DO   100  mg at 12/09/19 0855  . haloperidol lactate (HALDOL) injection 2 mg  2 mg Intravenous Q6H PRN Lynnell Catalan, MD   2 mg at 11/23/19 1655  . heparin injection 5,000 Units  5,000 Units Subcutaneous Q8H Agarwala, Daleen Bo, MD   5,000 Units at 12/09/19 8546  . hydrOXYzine (ATARAX/VISTARIL) tablet 25 mg  25 mg Oral Once Lynnell Catalan, MD      . insulin aspart (novoLOG) injection 0-20 Units  0-20 Units Subcutaneous TID WC Lynnell Catalan, MD   3 Units at 12/09/19 0855  . ipratropium-albuterol (DUONEB) 0.5-2.5 (3) MG/3ML nebulizer solution 3 mL  3 mL Nebulization BID Dow Adolph N, DO   3 mL at 12/09/19 0853  . MEDLINE mouth rinse  15 mL Mouth Rinse BID Lynnell Catalan, MD   15 mL at 12/09/19 0859  . melatonin tablet 3 mg  3 mg Oral QHS Dow Adolph N, DO   3 mg at 12/08/19 2212  . penicillin G potassium 12 Million Units in dextrose 5 % 500 mL continuous infusion  12 Million Units Intravenous Q12H Lynnell Catalan, MD 41.7 mL/hr at 12/09/19 0858 12 Million Units at 12/09/19 0858  . phenol (CHLORASEPTIC) mouth spray 1 spray  1 spray Mouth/Throat PRN Dow Adolph N, DO   1 spray at 12/03/19 1448  . polyethylene glycol (MIRALAX / GLYCOLAX) packet 17 g  17 g Oral Daily PRN Lynnell Catalan, MD   17 g at 11/30/19 0948  . senna-docusate (Senokot-S) tablet 2 tablet  2 tablet Oral BID Dow Adolph N, DO   2 tablet at 12/09/19 0855  . sodium chloride flush (NS) 0.9 % injection 10-40 mL  10-40 mL Intracatheter Q12H Lynnell Catalan, MD   10 mL at 12/09/19 0859  . sodium chloride flush (NS) 0.9 % injection 10-40 mL  10-40 mL Intracatheter PRN Agarwala, Daleen Bo, MD      . sodium zirconium cyclosilicate (LOKELMA) packet 10 g  10 g Oral BID Burnadette Pop, MD   10 g at 12/09/19 1118  . traMADol (ULTRAM) tablet 50 mg  50 mg Oral Once PRN Zierle-Ghosh, [place] B, DO         Discharge Medications: Please see discharge summary for a list of discharge medications.  Relevant Imaging Results:  Relevant Lab Results:   Additional  Information [id number]  Terrial Rhodes, LCSWA

## 2019-12-09 NOTE — Progress Notes (Addendum)
PROGRESS NOTE    Kevin L Hoehn Jr.  YWV:[id number] DOB: 1951-03-18 DOA: 11/22/2019 PCP: Malka So., MD   Brief Narrative:  Patient is a 69 year old male with history of hypertension, morbid obesity, type 2 diabetes mellitus, asthma, hyperlipidemia, heart block status post pacemaker placement, OSA who presents from home with 1 week history of decreased appetite, poor oral intake, diarrhea, abdomen/chest pain.  On presentation, he was found to be in septic shock and was started on vasopressors and was transferred to ICU.  Work-up revealed cervical ventral epidural abscess.  Blood cultures showed Streptococcus intermedius.  ID, neurosurgery consulted.  He was transferred to [facility] service on 11/30/2019.  Hospital course complicated by acute hypoxic respiratory failure secondary to pulmonary edema requiring up to 5 to 10 L of high flow nasal cannula.  Received Lasix for volume overload.  His respiratory status has  improved.  PT/OT recommending CIR on discharge.  Patient is stable for discharge to CIR as soon as bed is able.  Assessment & Plan:   Active Problems:   Hypotension   Pressure injury of skin   Palliative care by specialist   Goals of care, counseling/discussion   DNR (do not resuscitate) discussion   Hypoxia   Acute hypoxemic respiratory failure (HCC)   Weakness generalized   Acute respiratory failure with hypoxia/hypercarbia: Multifactorial secondary atelectasis, acute pulmonary edema, pleural effusions on the background of OSA/OSH.  Not on oxygen at home.  He required high flow oxygen up to 15 L/min.  Oxygen requirement has improved and he is feeling more comfortable.  Today he was on 2 L of oxygen per minute.  CTA of the chest did not show any PE.  Continue to wean the oxygen.  He was not on oxygen at home. Encourage BiPAP at night.  Septic shock with strep bacteremia: Secondary to cervical ventral epidural abscess.  Blood cultures showed Streptococcus intermedius.  ID was  following.  Currently on IV penicillin for total of 8-week duration with end date of 01/16/2020. TEE done on 8/30 did not show any evidence of endocarditis or pacemaker wire infection.  Cervical ventral epidural abscess: Neurosurgery was following.  Plan is to repeat MRI in 2 weeks on 12/14/2019.  Continue pain management.  On gabapentin.  History of OSA/possible OHS: Noncompliant with CPAP at home.  We will recommend to do a sleep study as an outpatient for initiation of CPAP.  Diabetes type 2 with hyperglycemia: Hemoglobin A1c of 7.1.  Continue current insulin regimen.  Takes Metformin, semaglutide at home.  HLD: takes Lipitor at home.  Morbid obesity: BMI of 41.  Recommend weight loss, balanced diet  Sleep apnea: Has CPAP at home but does not use often due to claustrophobia.  We encouraged him to use CPAP machine at home.  Stage II sacral ulcer: Present on admission.  Continue local wound care.  Hyperkalemia: Being treated with Lokelma.  We will continue to monitor potassium level.  Constipation: Continue bowel regimen  Hypertension: Currently blood pressure soft.  Takes carvedilol, hydrochlorothiazide at home.  Carvedilol on hold.  Will resume hydrochlorothiazide because he has some bilateral lower extremity edema.  Goals of care: Palliative care was following the patient.  Partial code.  No CPR/intubation but okay to ACLS medications/vasopressors.  Deconditioning/debility: PT/OT recommended CIR.               DVT prophylaxis:Heparin [place] Code Status: Partial Family Communication: Called wife on phone for update on 12/09/2019  status is: Inpatient  Remains inpatient appropriate  because:Inpatient level of care appropriate due to severity of illness   Dispo:  Patient From: Home  Planned Disposition: Inpatient Rehab  Expected discharge date: 12/09/19  Medically stable for discharge: Yes!     Consultants: ID,neurosurgery  Procedures:None  Antimicrobials:    Anti-infectives (From admission, onward)   Start     Dose/Rate Route Frequency Ordered Stop   11/29/19 1700  penicillin G potassium 12 Million Units in dextrose 5 % 500 mL continuous infusion        12 Million Units 41.7 mL/hr over 12 Hours Intravenous Every 12 hours 11/29/19 1539     11/24/19 1530  cefTRIAXone (ROCEPHIN) 2 g in sodium chloride 0.9 % 100 mL IVPB  Status:  Discontinued        2 g 200 mL/hr over 30 Minutes Intravenous Every 12 hours 11/24/19 1522 11/29/19 1539   11/24/19 1400  ceFEPIme (MAXIPIME) 2 g in sodium chloride 0.9 % 100 mL IVPB  Status:  Discontinued        2 g 200 mL/hr over 30 Minutes Intravenous Every 8 hours 11/24/19 0859 11/24/19 1522   11/23/19 2200  vancomycin (VANCOREADY) IVPB 1750 mg/350 mL  Status:  Discontinued        1,750 mg 175 mL/hr over 120 Minutes Intravenous Every 24 hours 11/23/19 0917 11/24/19 1013   11/23/19 1800  ceFEPIme (MAXIPIME) 2 g in sodium chloride 0.9 % 100 mL IVPB  Status:  Discontinued        2 g 200 mL/hr over 30 Minutes Intravenous Every 12 hours 11/23/19 0924 11/24/19 0859   11/23/19 0400  aztreonam (AZACTAM) 2 g in sodium chloride 0.9 % 100 mL IVPB  Status:  Discontinued        2 g 200 mL/hr over 30 Minutes Intravenous Every 8 hours 11/22/19 1719 11/23/19 0921   11/22/19 1730  vancomycin (VANCOREADY) IVPB 1500 mg/300 mL        1,500 mg 150 mL/hr over 120 Minutes Intravenous  Once 11/22/19 1715 11/22/19 2024   11/22/19 1718  vancomycin variable dose per unstable renal function (pharmacist dosing)  Status:  Discontinued         Does not apply See admin instructions 11/22/19 1719 11/24/19 0859   11/22/19 1645  aztreonam (AZACTAM) 2 g in sodium chloride 0.9 % 100 mL IVPB        2 g 200 mL/hr over 30 Minutes Intravenous  Once 11/22/19 1636 11/22/19 2116   11/22/19 1645  metroNIDAZOLE (FLAGYL) IVPB 500 mg        500 mg 100 mL/hr over 60 Minutes Intravenous  Once 11/22/19 1636 11/22/19 1813   11/22/19 1645  vancomycin (VANCOCIN)  IVPB 1000 mg/200 mL premix        1,000 mg 200 mL/hr over 60 Minutes Intravenous  Once 11/22/19 1636 11/22/19 1813      Subjective:  Patient seen and examined the bedside this morning.  Hemodynamically stable.  Comfortable.  Sitting in the chair.  On 2 L of oxygen.  Denies any chest pain or shortness of breath.  Objective: Vitals:   12/08/19 2129 12/08/19 2310 12/09/19 0356 12/09/19 0500  BP:    111/64  Pulse: 66 77 60 66  Resp: 20 16 18 16   Temp:    98.1 F (36.7 C)  TempSrc:    Oral  SpO2: 98% 98% 100% 93%  Weight:    127.5 kg  Height:        Intake/Output Summary (Last 24 hours) at 12/09/2019 [phone]  Last data filed at 12/09/2019 0700 Gross per 24 hour  Intake 910 ml  Output 750 ml  Net 160 ml   Filed Weights   12/07/19 0508 12/08/19 0425 12/09/19 0500  Weight: 128.6 kg 128.5 kg 127.5 kg    Examination:  General exam: Pleasant gentleman, comfortable, morbidly obese HEENT:PERRL,Oral mucosa moist, Ear/Nose normal on gross exam Respiratory system: Bilateral equal air entry, normal vesicular breath sounds, no wheezes or crackles  Cardiovascular system: S1 & S2 heard, RRR. No JVD, murmurs, rubs, gallops or clicks. Gastrointestinal system: Abdomen is distended, soft and nontender. No organomegaly or masses felt. Normal bowel sounds heard. Central nervous system: Alert and oriented. No focal neurological deficits. Extremities: Trace bilateral lower extremity  edema, no clubbing ,no cyanosis Skin: No rashes, lesions or ulcers,no icterus ,no pallor   Data Reviewed: I have personally reviewed following labs and imaging studies  CBC: Recent Labs  Lab 12/08/19 0427  WBC 4.4  NEUTROABS 2.5  HGB 9.0*  HCT 29.5*  MCV 98.7  PLT 290   Basic Metabolic Panel: Recent Labs  Lab 12/05/19 0500 12/06/19 0708 12/07/19 0619 12/08/19 0427 12/09/19 0500  NA 128* 134* 136 138 134*  K 5.8* 5.7* 4.1 4.4 5.7*  CL 89* 93* 97* 98 95*  CO2 33* 34* 36* 35* 32  GLUCOSE 330* 282*  119* 126* 233*  BUN 14 15 16 14 14   CREATININE 0.99 0.85 0.81 0.83 0.96  CALCIUM 8.9 9.0 9.1 9.1 8.9   GFR: Estimated Creatinine Clearance: 94.6 mL/min (by C-G formula based on SCr of 0.96 mg/dL). Liver Function Tests: No results for input(s): AST, ALT, ALKPHOS, BILITOT, PROT, ALBUMIN in the last 168 hours. No results for input(s): LIPASE, AMYLASE in the last 168 hours. No results for input(s): AMMONIA in the last 168 hours. Coagulation Profile: No results for input(s): INR, PROTIME in the last 168 hours. Cardiac Enzymes: No results for input(s): CKTOTAL, CKMB, CKMBINDEX, TROPONINI in the last 168 hours. BNP (last 3 results) No results for input(s): PROBNP in the last 8760 hours. HbA1C: No results for input(s): HGBA1C in the last 72 hours. CBG: Recent Labs  Lab 12/08/19 0725 12/08/19 1134 12/08/19 1612 12/08/19 2107 12/09/19 0757  GLUCAP 126* 141* 135* 130* 125*   Lipid Profile: No results for input(s): CHOL, HDL, LDLCALC, TRIG, CHOLHDL, LDLDIRECT in the last 72 hours. Thyroid Function Tests: No results for input(s): TSH, T4TOTAL, FREET4, T3FREE, THYROIDAB in the last 72 hours. Anemia Panel: No results for input(s): VITAMINB12, FOLATE, FERRITIN, TIBC, IRON, RETICCTPCT in the last 72 hours. Sepsis Labs: No results for input(s): PROCALCITON, LATICACIDVEN in the last 168 hours.  No results found for this or any previous visit (from the past 240 hour(s)).       Radiology Studies: No results found.      Scheduled Meds: . arformoterol  15 mcg Nebulization BID  . budesonide (PULMICORT) nebulizer solution  0.5 mg Nebulization BID  . Chlorhexidine Gluconate Cloth  6 each Topical Daily  . gabapentin  100 mg Oral TID  . heparin  5,000 Units Subcutaneous Q8H  . hydrOXYzine  25 mg Oral Once  . insulin aspart  0-20 Units Subcutaneous TID WC  . ipratropium-albuterol  3 mL Nebulization BID  . mouth rinse  15 mL Mouth Rinse BID  . melatonin  3 mg Oral QHS  .  senna-docusate  2 tablet Oral BID  . sodium chloride flush  10-40 mL Intracatheter Q12H  . sodium zirconium cyclosilicate  10 g Oral BID  Continuous Infusions: . sodium chloride Stopped (12/05/19 1015)  . penicillin g continuous IV infusion 12 Million Units (12/08/19 2227)     LOS: 17 days    Time spent: 25 mins,More than 50% of that time was spent in counseling and/or coordination of care.      Burnadette Pop, MD [facility] P9/12/2019, 8:28 AM

## 2019-12-09 NOTE — TOC Initial Note (Signed)
Transition of Care (TOC) - Initial/Assessment Note    Patient Details  Name: Kevin [name]. MRN: [id number] Date of Birth: [birth year]  Transition [facility]) CM/SW Contact:    Terrial Rhodes, LCSWA Phone Number: 12/09/2019, 11:43 AM  Clinical Narrative:                    CSW received consult for possible SNF placement as back up plan at time of discharge.CSW spoke with patient at bedside. Patient is hopeful to go to CIR when medically ready. Patient is agreeable to SNF as back up discharge plan. Patient has received both of his COVID vaccines.Patient gave CSW permission to fax out initial referral out close to his home address.No further questions reported at this time. CSW to continue to follow and assist with discharge planning needs.  CSW will continue to follow.   Barriers to Discharge: Continued Medical Work up   Patient Goals and CMS Choice Patient states their goals for this hospitalization and ongoing recovery are:: CIR and then SNF as backup CMS Medicare.gov Compare Post Acute Care list provided to:: Patient Choice offered to / list presented to : Patient  Expected Discharge Plan and Services         Living arrangements for the past 2 months: Single Family Home                                      Prior Living Arrangements/Services Living arrangements for the past 2 months: Single Family Home Lives with:: Self, Spouse Patient language and need for interpreter reviewed:: Yes Do you feel safe going back to the place where you live?: No   CIR/SNF  Need for Family Participation in Patient Care: Yes (Comment) Care giver support system in place?: Yes (comment)   Criminal Activity/Legal Involvement Pertinent to Current Situation/Hospitalization: No - Comment as needed  Activities of Daily Living Home Assistive Devices/Equipment: Eyeglasses ADL Screening (condition at time of admission) Patient's cognitive ability adequate to safely complete daily  activities?: No Is the patient deaf or have difficulty hearing?: No Does the patient have difficulty seeing, even when wearing glasses/contacts?: No Does the patient have difficulty concentrating, remembering, or making decisions?: Yes Patient able to express need for assistance with ADLs?: Yes Does the patient have difficulty dressing or bathing?: Yes Independently performs ADLs?: No Communication: Needs assistance Is this a change from baseline?: Change from baseline, expected to last <3 days Dressing (OT): Needs assistance Is this a change from baseline?: Change from baseline, expected to last <3days Grooming: Needs assistance Is this a change from baseline?: Change from baseline, expected to last <3 days Feeding: Needs assistance Is this a change from baseline?: Change from baseline, expected to last <3 days Bathing: Needs assistance Is this a change from baseline?: Change from baseline, expected to last <3 days Toileting: Needs assistance Is this a change from baseline?: Change from baseline, expected to last <3 days In/Out Bed: Needs assistance Is this a change from baseline?: Change from baseline, expected to last <3 days Walks in Home: Needs assistance Is this a change from baseline?: Change from baseline, expected to last <3 days Does the patient have difficulty walking or climbing stairs?: Yes Weakness of Legs: Both Weakness of Arms/Hands: Both  Permission Sought/Granted Permission sought to share information with : Case Manager, Family Supports, Photographer granted to  share info w AGENCY: SNF        Emotional Assessment Appearance:: Appears stated age Attitude/Demeanor/Rapport: Gracious Affect (typically observed): Calm Orientation: : Oriented to Self, Oriented to Place, Oriented to  Time, Oriented to Situation Alcohol / Substance Use: Not Applicable Psych Involvement: No (comment)  Admission diagnosis:  Respiration abnormal  [R06.9] Abdominal pain [R10.9] Hypotension [I95.9] Septic shock (HCC) [A41.9, R65.21] Acute renal failure, unspecified acute renal failure type (HCC) [N17.9] Acute hypoxemic respiratory failure (HCC) [J96.01] Patient Active Problem List   Diagnosis Date Noted  . Hypoxia   . Acute hypoxemic respiratory failure (HCC)   . Weakness generalized   . Palliative care by specialist   . Goals of care, counseling/discussion   . DNR (do not resuscitate) discussion   . Pressure injury of skin 11/26/2019  . Hypotension 11/22/2019   PCP:  Malka So., MD Pharmacy:   [facility] [phone] [place], [place] - [address] AT [facility] [address] [place] [address] Phone: [phone] Fax: [phone]  [facility] - [place], [place] - [address] [place], [address] [place], [address] [place] [address] Phone: [phone] Fax: [phone]  [facility] - [place], [place] - [address] [place] [address] Phone: [phone] Fax: [phone]     Social Determinants of Health (SDOH) Interventions    Readmission Risk Interventions No flowsheet data found.

## 2019-12-09 NOTE — Progress Notes (Signed)
This chaplain was present with notary and witnesses as the Pt. signed/completed HCPOA and Living Will. The Pt. Wife-Kevin [name] is present in the room. The Pt. named Kevin [name] [phone] as his Health Care Agent. Kevin [name] [id number] [phone] was named as the person of choice if the Health Care Agent is unable to serve. The original copy was given to the Pt., along with copies for the Health Care Agents.  A copy was placed in the Pt. Chart and scanned into medical records.  F/U spiritual care is available as needed.

## 2019-12-10 LAB — BASIC METABOLIC PANEL
Anion gap: 8 (ref 5–15)
BUN: 13 mg/dL (ref 8–23)
CO2: 29 mmol/L (ref 22–32)
Calcium: 8.6 mg/dL — ABNORMAL LOW (ref 8.9–10.3)
Chloride: 97 mmol/L — ABNORMAL LOW (ref 98–111)
Creatinine, Ser: 0.92 mg/dL (ref 0.61–1.24)
GFR calc Af Amer: >60 mL/min (ref >60)
GFR calc non Af Amer: >60 mL/min (ref >60)
Glucose, Bld: 268 mg/dL — ABNORMAL HIGH (ref 70–99)
Potassium: 5.7 mmol/L — ABNORMAL HIGH (ref 3.5–5.1)
Sodium: 134 mmol/L — ABNORMAL LOW (ref 135–145)

## 2019-12-10 LAB — GLUCOSE, CAPILLARY
Glucose-Capillary: 166 mg/dL — ABNORMAL HIGH (ref 70–99)
Glucose-Capillary: 153 mg/dL — ABNORMAL HIGH (ref 70–99)
Glucose-Capillary: 93 mg/dL (ref 70–99)
Glucose-Capillary: 136 mg/dL — ABNORMAL HIGH (ref 70–99)

## 2019-12-10 LAB — POTASSIUM: Potassium: 3.6 mmol/L (ref 3.5–5.1)

## 2019-12-10 MED ORDER — FUROSEMIDE 10 MG/ML IJ SOLN
40.0000 mg | Freq: Two times a day (BID) | INTRAMUSCULAR | Status: AC
  Administered 2019-12-10 (×2): 40 mg via INTRAVENOUS
  Filled 2019-12-10 (×3): qty 4

## 2019-12-10 MED ORDER — SODIUM CHLORIDE 0.9 % IV SOLN
2.0000 g | Freq: Two times a day (BID) | INTRAVENOUS | Status: DC
  Administered 2019-12-10 – 2019-12-12 (×5): 2 g via INTRAVENOUS
  Filled 2019-12-10: qty 20
  Filled 2019-12-10 (×5): qty 2

## 2019-12-10 MED ORDER — SODIUM ZIRCONIUM CYCLOSILICATE 10 G PO PACK
10.0000 g | PACK | Freq: Two times a day (BID) | ORAL | Status: DC
  Administered 2019-12-10: 10 g via ORAL
  Filled 2019-12-10 (×3): qty 1

## 2019-12-10 MED ORDER — FUROSEMIDE 40 MG PO TABS
40.0000 mg | ORAL_TABLET | Freq: Every day | ORAL | Status: DC
  Administered 2019-12-11 – 2019-12-13 (×3): 40 mg via ORAL
  Filled 2019-12-10 (×3): qty 1

## 2019-12-10 NOTE — Progress Notes (Signed)
RN states she can assist pt with BIPAP when he is ready for bed. RT checked connections, made sure BIPAP was ready. RN will notify for RT if any assistance is needed.

## 2019-12-10 NOTE — Progress Notes (Signed)
PROGRESS NOTE    Kevin L Sansom Jr.  GUY:[id number] DOB: 03-May-1950 DOA: 11/22/2019 PCP: Malka SoJobe, Daniel B., MD   Brief Narrative:  Patient is a 69 year old male with history of hypertension, morbid obesity, type 2 diabetes mellitus, asthma, hyperlipidemia, heart block status post pacemaker placement, OSA who presents from home with 1 week history of decreased appetite, poor oral intake, diarrhea, abdomen/chest pain.  On presentation, he was found to be in septic shock and was started on vasopressors and was transferred to ICU.  Work-up revealed cervical ventral epidural abscess.  Blood cultures showed Streptococcus intermedius.  ID, neurosurgery consulted.  [facility]  Hospital course complicated by acute hypoxic respiratory failure secondary to pulmonary edema requiring up to 5 to 10 L of high flow nasal cannula.  Received Lasix for volume overload.  His respiratory status has  improved.  PT/OT recommending CIR on discharge.  Patient is stable for discharge to CIR as soon as bed is available.  Assessment & Plan:   Active Problems:   Hypotension   Pressure injury of skin   Palliative care by specialist   Goals of care, counseling/discussion   DNR (do not resuscitate) discussion   Hypoxia   Acute hypoxemic respiratory failure (HCC)   Weakness generalized   Acute respiratory failure with hypoxia/hypercarbia: Multifactorial secondary to atelectasis, acute pulmonary edema, pleural effusions on the background of OSA/OSH.  Not on oxygen at home.  He required high flow oxygen up to 15 L/min.  Oxygen requirement has improved and he is feeling more comfortable.  Today he was on 2 L of oxygen per minute.  CTA of the chest did not show any PE.  Continue to wean the oxygen.  He was not on oxygen at home. Encourage BiPAP at night.  Septic shock with strep bacteremia: Secondary to cervical ventral epidural abscess.  Blood cultures showed Streptococcus intermedius.  ID  was following.  He was  on IV penicillin for total of 8-week duration with end date of 01/16/2020.  Antibiotics changed to ceftriaxone due to persistent hyperkalemia (IV penicillin had potassium in it) TEE done on 8/30 did not show any evidence of endocarditis or pacemaker wire infection.  Cervical ventral epidural abscess: Neurosurgery was following.  Plan is to repeat MRI in 2 weeks on 12/14/2019.  Continue pain management.  On gabapentin.  Diabetes type 2 with hyperglycemia: Hemoglobin A1c of 7.1.  Continue current insulin regimen.  Takes Metformin, semaglutide at home.  HLD: takes Lipitor at home.  Morbid obesity: BMI of 41.  Recommend weight loss, balanced diet  Sleep apnea: Has CPAP at home but does not use often due to claustrophobia.  We encouraged him to use CPAP machine at home.  Stage II sacral ulcer: Present on admission.  Continue local wound care.  Hyperkalemia: Most likely secondary to the penicillin which contains K.  I requested the pharmacy to change the formulary and the abx has now been chganed to ceftriaxone.Being treated with Lokelma.  We will continue to monitor potassium level.  Constipation: Continue bowel regimen  Hypertension: Currently blood pressure soft.  Takes carvedilol, hydrochlorothiazide at home.  Carvedilol on hold.   Lower extremity edema: 2-3 + bilateral pitting LE edema. Most likely manifested due to agressive fluid hydration earlier for septic shock. We will given him 2 doses of IV lasix and put on oral lasix from tomorrow. Will closely monitor his BP which is soft. Echo done on 10/2019 showed LVEF of 60-65%,normal diastolic parameters. He  is on HCTZ at home  Goals of care: Palliative care was following the patient.  Partial code.  No CPR/intubation but okay to ACLS medications/vasopressors.  Deconditioning/debility: PT/OT recommended CIR.           DVT prophylaxis:Heparin [place] Code Status: Partial Family Communication: Called wife on phone for  update on 12/09/2019  status is: Inpatient  Remains inpatient appropriate because:Inpatient level of care appropriate due to severity of illness   Dispo:  Patient From: Home  Planned Disposition: Inpatient Rehab  Expected discharge date:12/12/19  Medically stable for discharge: Yes!   Consultants: ID,neurosurgery  Procedures:None  Antimicrobials:  Anti-infectives (From admission, onward)   Start     Dose/Rate Route Frequency Ordered Stop   11/29/19 1700  penicillin G potassium 12 Million Units in dextrose 5 % 500 mL continuous infusion        12 Million Units 41.7 mL/hr over 12 Hours Intravenous Every 12 hours 11/29/19 1539     11/24/19 1530  cefTRIAXone (ROCEPHIN) 2 g in sodium chloride 0.9 % 100 mL IVPB  Status:  Discontinued        2 g 200 mL/hr over 30 Minutes Intravenous Every 12 hours 11/24/19 1522 11/29/19 1539   11/24/19 1400  ceFEPIme (MAXIPIME) 2 g in sodium chloride 0.9 % 100 mL IVPB  Status:  Discontinued        2 g 200 mL/hr over 30 Minutes Intravenous Every 8 hours 11/24/19 0859 11/24/19 1522   11/23/19 2200  vancomycin (VANCOREADY) IVPB 1750 mg/350 mL  Status:  Discontinued        1,750 mg 175 mL/hr over 120 Minutes Intravenous Every 24 hours 11/23/19 0917 11/24/19 1013   11/23/19 1800  ceFEPIme (MAXIPIME) 2 g in sodium chloride 0.9 % 100 mL IVPB  Status:  Discontinued        2 g 200 mL/hr over 30 Minutes Intravenous Every 12 hours 11/23/19 0924 11/24/19 0859   11/23/19 0400  aztreonam (AZACTAM) 2 g in sodium chloride 0.9 % 100 mL IVPB  Status:  Discontinued        2 g 200 mL/hr over 30 Minutes Intravenous Every 8 hours 11/22/19 1719 11/23/19 0921   11/22/19 1730  vancomycin (VANCOREADY) IVPB 1500 mg/300 mL        1,500 mg 150 mL/hr over 120 Minutes Intravenous  Once 11/22/19 1715 11/22/19 2024   11/22/19 1718  vancomycin variable dose per unstable renal function (pharmacist dosing)  Status:  Discontinued         Does not apply See admin instructions 11/22/19  1719 11/24/19 0859   11/22/19 1645  aztreonam (AZACTAM) 2 g in sodium chloride 0.9 % 100 mL IVPB        2 g 200 mL/hr over 30 Minutes Intravenous  Once 11/22/19 1636 11/22/19 2116   11/22/19 1645  metroNIDAZOLE (FLAGYL) IVPB 500 mg        500 mg 100 mL/hr over 60 Minutes Intravenous  Once 11/22/19 1636 11/22/19 1813   11/22/19 1645  vancomycin (VANCOCIN) IVPB 1000 mg/200 mL premix        1,000 mg 200 mL/hr over 60 Minutes Intravenous  Once 11/22/19 1636 11/22/19 1813      Subjective:  Patient seen and examined the bedside this morning.  Comfortably sitting on the chair.  Denies any shortness of breath or chest pain.  On 2 L of oxygen per minute.  He had a good bowel movement this morning.  Still has significant bilateral lower extremity edema.  Objective: Vitals:   12/09/19 2339 12/10/19 0337 12/10/19 0400 12/10/19 0733  BP: 118/67 109/82  108/75  Pulse: 75  70   Resp: 20   18  Temp: 97.7 F (36.5 C) 98 F (36.7 C)  98 F (36.7 C)  TempSrc: Axillary   Oral  SpO2: 100%  94% 96%  Weight:   128 kg   Height:        Intake/Output Summary (Last 24 hours) at 12/10/2019 0809 Last data filed at 12/10/2019 0401 Gross per 24 hour  Intake 3040.59 ml  Output 800 ml  Net 2240.59 ml   Filed Weights   12/08/19 0425 12/09/19 0500 12/10/19 0400  Weight: 128.5 kg 127.5 kg 128 kg    Examination:  General exam: Pleasant gentleman, morbidly obese, comfortable HEENT:PERRL,Oral mucosa moist, Ear/Nose normal on gross exam Respiratory system: Bilateral equal air entry, normal vesicular breath sounds, no wheezes or crackles  Cardiovascular system: S1 & S2 heard, RRR. No JVD, murmurs, rubs, gallops or clicks. Gastrointestinal system: Abdomen is distended, soft and nontender. No organomegaly or masses felt. Normal bowel sounds heard. Central nervous system: Alert and oriented. No focal neurological deficits. Extremities: 2-3+ bilateral lower extremity pitting edema, no clubbing ,no  cyanosis Skin: No rashes, lesions or ulcers,no icterus ,no pallor    Data Reviewed: I have personally reviewed following labs and imaging studies  CBC: Recent Labs  Lab 12/08/19 0427  WBC 4.4  NEUTROABS 2.5  HGB 9.0*  HCT 29.5*  MCV 98.7  PLT 290   Basic Metabolic Panel: Recent Labs  Lab 12/06/19 0708 12/06/19 0708 12/07/19 0619 12/07/19 0619 12/08/19 0427 12/09/19 0500 12/09/19 0917 12/09/19 1815 12/10/19 0500  NA 134*  --  136  --  138 134*  --   --  134*  K 5.7*   < > 4.1   < > 4.4 5.7* 5.9* 6.0* 5.7*  CL 93*  --  97*  --  98 95*  --   --  97*  CO2 34*  --  36*  --  35* 32  --   --  29  GLUCOSE 282*  --  119*  --  126* 233*  --   --  268*  BUN 15  --  16  --  14 14  --   --  13  CREATININE 0.85  --  0.81  --  0.83 0.96  --   --  0.92  CALCIUM 9.0  --  9.1  --  9.1 8.9  --   --  8.6*   < > = values in this interval not displayed.   GFR: Estimated Creatinine Clearance: 99 mL/min (by C-G formula based on SCr of 0.92 mg/dL). Liver Function Tests: No results for input(s): AST, ALT, ALKPHOS, BILITOT, PROT, ALBUMIN in the last 168 hours. No results for input(s): LIPASE, AMYLASE in the last 168 hours. No results for input(s): AMMONIA in the last 168 hours. Coagulation Profile: No results for input(s): INR, PROTIME in the last 168 hours. Cardiac Enzymes: No results for input(s): CKTOTAL, CKMB, CKMBINDEX, TROPONINI in the last 168 hours. BNP (last 3 results) No results for input(s): PROBNP in the last 8760 hours. HbA1C: No results for input(s): HGBA1C in the last 72 hours. CBG: Recent Labs  Lab 12/08/19 2107 12/09/19 0757 12/09/19 1223 12/09/19 1718 12/09/19 2105  GLUCAP 130* 125* 171* 148* 137*   Lipid Profile: No results for input(s): CHOL, HDL, LDLCALC, TRIG, CHOLHDL, LDLDIRECT in the last 72 hours. Thyroid Function  Tests: No results for input(s): TSH, T4TOTAL, FREET4, T3FREE, THYROIDAB in the last 72 hours. Anemia Panel: No results for input(s):  VITAMINB12, FOLATE, FERRITIN, TIBC, IRON, RETICCTPCT in the last 72 hours. Sepsis Labs: No results for input(s): PROCALCITON, LATICACIDVEN in the last 168 hours.  No results found for this or any previous visit (from the past 240 hour(s)).       Radiology Studies: No results found.      Scheduled Meds: . arformoterol  15 mcg Nebulization BID  . budesonide (PULMICORT) nebulizer solution  0.5 mg Nebulization BID  . Chlorhexidine Gluconate Cloth  6 each Topical Daily  . gabapentin  100 mg Oral TID  . heparin  5,000 Units Subcutaneous Q8H  . hydrochlorothiazide  25 mg Oral q AM  . hydrOXYzine  25 mg Oral Once  . insulin aspart  0-20 Units Subcutaneous TID WC  . ipratropium-albuterol  3 mL Nebulization BID  . mouth rinse  15 mL Mouth Rinse BID  . melatonin  3 mg Oral QHS  . senna-docusate  2 tablet Oral BID  . sodium chloride flush  10-40 mL Intracatheter Q12H   Continuous Infusions: . sodium chloride Stopped (12/05/19 1015)  . penicillin g continuous IV infusion 41.7 mL/hr at 12/09/19 2110     LOS: 18 days    Time spent: 25 mins,More than 50% of that time was spent in counseling and/or coordination of care.      Burnadette Pop, MD [facility] P9/01/2020, 8:09 AM

## 2019-12-11 DIAGNOSIS — R0602 Shortness of breath: Secondary | ICD-10-CM

## 2019-12-11 LAB — GLUCOSE, CAPILLARY
Glucose-Capillary: 124 mg/dL — ABNORMAL HIGH (ref 70–99)
Glucose-Capillary: 107 mg/dL — ABNORMAL HIGH (ref 70–99)
Glucose-Capillary: 177 mg/dL — ABNORMAL HIGH (ref 70–99)
Glucose-Capillary: 139 mg/dL — ABNORMAL HIGH (ref 70–99)

## 2019-12-11 LAB — CBC WITH DIFFERENTIAL/PLATELET
Abs Immature Granulocytes: 0.02 10*3/uL (ref 0.00–0.07)
Basophils Absolute: 0.1 10*3/uL (ref 0.0–0.1)
Basophils Relative: 1 %
Eosinophils Absolute: 0.6 10*3/uL — ABNORMAL HIGH (ref 0.0–0.5)
Eosinophils Relative: 12 %
HCT: 30.8 % — ABNORMAL LOW (ref 39.0–52.0)
Hemoglobin: 9.7 g/dL — ABNORMAL LOW (ref 13.0–17.0)
Immature Granulocytes: 0 %
Lymphocytes Relative: 19 %
Lymphs Abs: 0.9 10*3/uL (ref 0.7–4.0)
MCH: 31.1 pg (ref 26.0–34.0)
MCHC: 31.5 g/dL (ref 30.0–36.0)
MCV: 98.7 fL (ref 80.0–100.0)
Monocytes Absolute: 0.4 10*3/uL (ref 0.1–1.0)
Monocytes Relative: 8 %
Neutro Abs: 3 10*3/uL (ref 1.7–7.7)
Neutrophils Relative %: 60 %
Platelets: 263 10*3/uL (ref 150–400)
RBC: 3.12 MIL/uL — ABNORMAL LOW (ref 4.22–5.81)
RDW: 16.8 % — ABNORMAL HIGH (ref 11.5–15.5)
WBC: 5 10*3/uL (ref 4.0–10.5)
nRBC: 0 % (ref 0.0–0.2)

## 2019-12-11 LAB — BASIC METABOLIC PANEL
Anion gap: 9 (ref 5–15)
BUN: 8 mg/dL (ref 8–23)
CO2: 35 mmol/L — ABNORMAL HIGH (ref 22–32)
Calcium: 9.1 mg/dL (ref 8.9–10.3)
Chloride: 96 mmol/L — ABNORMAL LOW (ref 98–111)
Creatinine, Ser: 0.97 mg/dL (ref 0.61–1.24)
GFR calc Af Amer: >60 mL/min (ref >60)
GFR calc non Af Amer: >60 mL/min (ref >60)
Glucose, Bld: 121 mg/dL — ABNORMAL HIGH (ref 70–99)
Potassium: 3.4 mmol/L — ABNORMAL LOW (ref 3.5–5.1)
Sodium: 140 mmol/L (ref 135–145)

## 2019-12-11 MED ORDER — POTASSIUM CHLORIDE CRYS ER 20 MEQ PO TBCR
40.0000 meq | EXTENDED_RELEASE_TABLET | Freq: Once | ORAL | Status: AC
  Administered 2019-12-11: 40 meq via ORAL
  Filled 2019-12-11: qty 2

## 2019-12-11 MED ORDER — ATORVASTATIN CALCIUM 10 MG PO TABS
20.0000 mg | ORAL_TABLET | Freq: Every day | ORAL | Status: DC
  Administered 2019-12-11 – 2019-12-13 (×3): 20 mg via ORAL
  Filled 2019-12-11 (×3): qty 2

## 2019-12-11 NOTE — TOC Progression Note (Signed)
Transition of Care (TOC) - Progression Note    Patient Details  Name: Kevin [name]. MRN: [id number] Date of Birth: 05-25-1950  [facility]) CM/SW Contact  Terrial Rhodes, LCSWA Phone Number: 12/11/2019, 8:54 AM  Clinical Narrative:      CSW spoke with patient by phone. Patient is expecting to speak with his spouse sometime after 10:00am this morning regarding SNF choice. Patient agreed for CSW to follow up with him on SNF choice after lunch time.   Pending SNF choice. CSW will continue to follow.   Barriers to Discharge: Continued Medical Work up  Expected Discharge Plan and Services         Living arrangements for the past 2 months: Single Family Home                                       Social Determinants of Health (SDOH) Interventions    Readmission Risk Interventions No flowsheet data found.

## 2019-12-11 NOTE — Progress Notes (Signed)
PROGRESS NOTE    Kevin L Vlcek Jr.  NGE:[id number] DOB: Mar 13, 1951 DOA: 11/22/2019 PCP: Malka So., MD   Brief Narrative:  Patient is a 69 year old male with history of hypertension, morbid obesity, type 2 diabetes mellitus, asthma, hyperlipidemia, heart block status post pacemaker placement, OSA who presented from home with 1 week history of decreased appetite, poor oral intake, diarrhea, abdomen/chest pain.  On presentation, he was found to be in septic shock and was started on vasopressors and was transferred to ICU.  Work-up revealed cervical ventral epidural abscess.  Blood cultures showed Streptococcus intermedius.  ID and  neurosurgery were consulted.  He was [facility] on 11/30/2019.  Hospital course complicated by acute hypoxic respiratory failure secondary to pulmonary edema requiring up to 5 to 10 L of high flow nasal cannula.  Received Lasix for volume overload.  His respiratory status has  improved.  PT/OT recommending CIR on discharge.  Patient is stable for discharge to CIR/SNF as soon as bed is available.  Assessment & Plan:    Active Problems:   Hypotension   Pressure injury of skin   Palliative care by specialist   Goals of care, counseling/discussion   DNR (do not resuscitate) discussion   Hypoxia   Acute hypoxemic respiratory failure (HCC)   Weakness generalized   Shortness of breath   Acute respiratory failure with hypoxia/hypercarbia: Likely multifactorial, secondary to atelectasis, acute pulmonary edema with history of obstructive sleep apnea.  Has improved at this time.  Currently on 1 L of nasal cannula oxygen.  CT angiogram of the chest did not show any pulmonary embolism.  Not on oxygen at home.  We will continue to wean oxygen as able.   Septic shock with strep bacteremia: Secondary to cervical ventral epidural abscess.  Blood cultures showed Streptococcus intermedius.  Patient was initially on IV penicillin which has been changed to IV  Rocephin due to hyperkalemia.  Infectious disease on board.  Plan is to continue 8 weeks of IV antibiotic until10/18/2021. TEE done on 8/30 did not show any evidence of endocarditis or pacemaker wire infection.  Cervical ventral epidural abscess: No surgical intervention done.  Neurosurgery on board and plan is to repeat MRI in 2 weeks on 12/14/2019.  Continue supportive care including gabapentin and tramadol.  Diabetes type 2 with hyperglycemia: Hemoglobin A1c of 7.1.  Continue current insulin regimen.  Takes Metformin, semaglutide at home.  Overall adequately controlled.  Last POC glucose of 124.  Mild hypokalemia.  Potassium of 3.4 today.  Will replace.  Check levels in a.m.  Hyperlipidemia: On Lipitor at home.  Will resume.  Morbid obesity: BMI of 41.  Patient would benefit from continued weight loss.  Sleep apnea: Supposed to be on CPAP but does not use consistently due to claustrophobia.  Encouraged CPAP.    Stage II sacral ulcer: Present on admission.  Continue local wound care.  Hyperkalemia: Received Lokelma.  Will hold.  Potassium of 3.4 today.  Likely secondary to penicillin containing potassium.  Will give 1 dose of p.o. potassium today since the patient is on Lasix.  Constipation: Continue MiraLAX and Senokot.  Essential hypertension: Currently on oral Lasix.  Patient takes Coreg and HCTZ at home.  Coreg on hold at this time.  Blood pressure is borderline low.  Lower extremity edema: Likely secondary to aggressive fluid hydration.  On oral Lasix.  2D echo done on 10/2019 showed LVEF of 60-65%,normal diastolic parameters. He is on HCTZ at home, which will be  resumed on discharge.  Goals of care: Palliative care was following the patient.  Partial code.  No CPR/intubation but okay to ACLS medications/vasopressors.  Deconditioning/debility: Patient was seen by physical therapy and Occupational Therapy and recommended CIR.    DVT prophylaxis: Heparin [place]    Code Status:    Partial code  Family Communication:  None today   Status is: Inpatient  Remains inpatient appropriate because:Inpatient level of care appropriate due to severity of illness  Dispo:  Patient From: Home  Planned Disposition: Inpatient Rehab/snf  Expected discharge date:12/12/19  Medically stable for discharge: Yes, will need possible PICC line on discharge for antibiotic.  MRI has been recommended by neurosurgery.   Consultants:  Infectious disease, neurosurgery  Procedures: None  Antimicrobials:  Rocephin iv   Anti-infectives (From admission, onward)   Start     Dose/Rate Route Frequency Ordered Stop   12/10/19 1000  cefTRIAXone (ROCEPHIN) 2 g in sodium chloride 0.9 % 100 mL IVPB        2 g 200 mL/hr over 30 Minutes Intravenous Every 12 hours 12/10/19 0857     11/29/19 1700  penicillin G potassium 12 Million Units in dextrose 5 % 500 mL continuous infusion  Status:  Discontinued        12 Million Units 41.7 mL/hr over 12 Hours Intravenous Every 12 hours 11/29/19 1539 12/10/19 0857   11/24/19 1530  cefTRIAXone (ROCEPHIN) 2 g in sodium chloride 0.9 % 100 mL IVPB  Status:  Discontinued        2 g 200 mL/hr over 30 Minutes Intravenous Every 12 hours 11/24/19 1522 11/29/19 1539   11/24/19 1400  ceFEPIme (MAXIPIME) 2 g in sodium chloride 0.9 % 100 mL IVPB  Status:  Discontinued        2 g 200 mL/hr over 30 Minutes Intravenous Every 8 hours 11/24/19 0859 11/24/19 1522   11/23/19 2200  vancomycin (VANCOREADY) IVPB 1750 mg/350 mL  Status:  Discontinued        1,750 mg 175 mL/hr over 120 Minutes Intravenous Every 24 hours 11/23/19 0917 11/24/19 1013   11/23/19 1800  ceFEPIme (MAXIPIME) 2 g in sodium chloride 0.9 % 100 mL IVPB  Status:  Discontinued        2 g 200 mL/hr over 30 Minutes Intravenous Every 12 hours 11/23/19 0924 11/24/19 0859   11/23/19 0400  aztreonam (AZACTAM) 2 g in sodium chloride 0.9 % 100 mL IVPB  Status:  Discontinued        2 g 200 mL/hr over 30 Minutes  Intravenous Every 8 hours 11/22/19 1719 11/23/19 0921   11/22/19 1730  vancomycin (VANCOREADY) IVPB 1500 mg/300 mL        1,500 mg 150 mL/hr over 120 Minutes Intravenous  Once 11/22/19 1715 11/22/19 2024   11/22/19 1718  vancomycin variable dose per unstable renal function (pharmacist dosing)  Status:  Discontinued         Does not apply See admin instructions 11/22/19 1719 11/24/19 0859   11/22/19 1645  aztreonam (AZACTAM) 2 g in sodium chloride 0.9 % 100 mL IVPB        2 g 200 mL/hr over 30 Minutes Intravenous  Once 11/22/19 1636 11/22/19 2116   11/22/19 1645  metroNIDAZOLE (FLAGYL) IVPB 500 mg        500 mg 100 mL/hr over 60 Minutes Intravenous  Once 11/22/19 1636 11/22/19 1813   11/22/19 1645  vancomycin (VANCOCIN) IVPB 1000 mg/200 mL premix  1,000 mg 200 mL/hr over 60 Minutes Intravenous  Once 11/22/19 1636 11/22/19 1813      Subjective:  Today, patient was seen and examined at bedside.  Patient's wife is present.  Complains of mild dysuria intermittently.  Had a history of Foley.  Denies fever chills.  Denies neck pain.  Has weakness in his extremities..  Objective: Vitals:   12/11/19 0011 12/11/19 0508 12/11/19 0733 12/11/19 0738  BP: 131/77 98/64  115/71  Pulse: 74   77  Resp: 18 18  18   Temp: 98.7 F (37.1 C) 98 F (36.7 C)  (!) 97.4 F (36.3 C)  TempSrc: Oral Oral  Axillary  SpO2:   94% 95%  Weight:  127.6 kg    Height:        Intake/Output Summary (Last 24 hours) at 12/11/2019 0912 Last data filed at 12/11/2019 0836 Gross per 24 hour  Intake 730 ml  Output 3325 ml  Net -2595 ml   Filed Weights   12/09/19 0500 12/10/19 0400 12/11/19 0508  Weight: 127.5 kg 128 kg 127.6 kg   Body mass index is 40.36 kg/m.   Examination:  General: Morbidly obese, not in obvious distress, alert awake communicative HENT:   No scleral pallor or icterus noted. Oral mucosa is moist.  Chest:   Diminished breath sounds bilaterally. No crackles or wheezes.  CVS: S1 &S2  heard. No murmur.  Regular rate and rhythm. Abdomen: Soft, nontender, nondistended.  Bowel sounds are heard.   Extremities: No cyanosis, clubbing but bilateral lower extremity pitting edema ++noted.  Peripheral pulses are palpable. Psych: Alert, awake and oriented, normal mood CNS:  No cranial nerve deficits.  Power equal in all extremities.   Skin: Warm and dry.  No rashes noted.   Data Reviewed: I have personally reviewed following labs and imaging studies  CBC: Recent Labs  Lab 12/08/19 0427 12/11/19 0555  WBC 4.4 5.0  NEUTROABS 2.5 3.0  HGB 9.0* 9.7*  HCT 29.5* 30.8*  MCV 98.7 98.7  PLT 290 263   Basic Metabolic Panel: Recent Labs  Lab 12/07/19 0619 12/07/19 0619 12/08/19 0427 12/08/19 0427 12/09/19 0500 12/09/19 0500 12/09/19 0917 12/09/19 1815 12/10/19 0500 12/10/19 1633 12/11/19 0555  NA 136  --  138  --  134*  --   --   --  134*  --  140  K 4.1   < > 4.4   < > 5.7*   < > 5.9* 6.0* 5.7* 3.6 3.4*  CL 97*  --  98  --  95*  --   --   --  97*  --  96*  CO2 36*  --  35*  --  32  --   --   --  29  --  35*  GLUCOSE 119*  --  126*  --  233*  --   --   --  268*  --  121*  BUN 16  --  14  --  14  --   --   --  13  --  8  CREATININE 0.81  --  0.83  --  0.96  --   --   --  0.92  --  0.97  CALCIUM 9.1  --  9.1  --  8.9  --   --   --  8.6*  --  9.1   < > = values in this interval not displayed.   GFR: Estimated Creatinine Clearance: 93.7 mL/min (by C-G formula based on  SCr of 0.97 mg/dL). Liver Function Tests: No results for input(s): AST, ALT, ALKPHOS, BILITOT, PROT, ALBUMIN in the last 168 hours. No results for input(s): LIPASE, AMYLASE in the last 168 hours. No results for input(s): AMMONIA in the last 168 hours. Coagulation Profile: No results for input(s): INR, PROTIME in the last 168 hours. Cardiac Enzymes: No results for input(s): CKTOTAL, CKMB, CKMBINDEX, TROPONINI in the last 168 hours. BNP (last 3 results) No results for input(s): PROBNP in the last 8760  hours. HbA1C: No results for input(s): HGBA1C in the last 72 hours. CBG: Recent Labs  Lab 12/10/19 0857 12/10/19 1138 12/10/19 1622 12/10/19 2028 12/11/19 0733  GLUCAP 166* 153* 93 136* 124*   Lipid Profile: No results for input(s): CHOL, HDL, LDLCALC, TRIG, CHOLHDL, LDLDIRECT in the last 72 hours. Thyroid Function Tests: No results for input(s): TSH, T4TOTAL, FREET4, T3FREE, THYROIDAB in the last 72 hours. Anemia Panel: No results for input(s): VITAMINB12, FOLATE, FERRITIN, TIBC, IRON, RETICCTPCT in the last 72 hours. Sepsis Labs: No results for input(s): PROCALCITON, LATICACIDVEN in the last 168 hours.  No results found for this or any previous visit (from the past 240 hour(s)).     Radiology Studies: No results found.    Scheduled Meds: . arformoterol  15 mcg Nebulization BID  . budesonide (PULMICORT) nebulizer solution  0.5 mg Nebulization BID  . Chlorhexidine Gluconate Cloth  6 each Topical Daily  . furosemide  40 mg Oral Daily  . gabapentin  100 mg Oral TID  . heparin  5,000 Units Subcutaneous Q8H  . hydrOXYzine  25 mg Oral Once  . insulin aspart  0-20 Units Subcutaneous TID WC  . ipratropium-albuterol  3 mL Nebulization BID  . mouth rinse  15 mL Mouth Rinse BID  . melatonin  3 mg Oral QHS  . senna-docusate  2 tablet Oral BID  . sodium chloride flush  10-40 mL Intracatheter Q12H   Continuous Infusions: . sodium chloride Stopped (12/05/19 1015)  . cefTRIAXone (ROCEPHIN)  IV 2 g (12/11/19 0837)     LOS: 19 days    Joycelyn Das, MD [facility] 12/11/2019, 9:12 AM

## 2019-12-11 NOTE — TOC Progression Note (Addendum)
Transition of Care (TOC) - Progression Note    Patient Details  Name: Shonta Phillis. MRN: [id number] Date of Birth: 1950-10-11  Transition of Care [facility]) CM/SW Contact  Terrial Rhodes, [place] Phone Number: 12/11/2019, 3:47 PM  Clinical Narrative:      Update 12/11/19- CSW spoke with patients spouse who has confirmed that she wants patient to go to [facility] for SNF as back up to CIR.   CSW will continue to follow.   CSW spoke with patient and patients spouse. Patient wanted CSW to discuss bed offers with patients spouse.CSW provided bed offers to patients spouse. Patients spouse would like for CSW to see if patient could get a bed offer at [facility]. CSW called Belenda Cruise with [facility] who says she can accept patient for SNF placement. CSW left voicemail with patients spouse. CSW awaiting callback.  CSW will continue to follow.     Barriers to Discharge: Continued Medical Work up  Expected Discharge Plan and Services         Living arrangements for the past 2 months: Single Family Home                                       Social Determinants of Health (SDOH) Interventions    Readmission Risk Interventions No flowsheet data found.

## 2019-12-12 LAB — CBC
HCT: 33.1 % — ABNORMAL LOW (ref 39.0–52.0)
Hemoglobin: 10.1 g/dL — ABNORMAL LOW (ref 13.0–17.0)
MCH: 30.1 pg (ref 26.0–34.0)
MCHC: 30.5 g/dL (ref 30.0–36.0)
MCV: 98.8 fL (ref 80.0–100.0)
Platelets: 286 10*3/uL (ref 150–400)
RBC: 3.35 MIL/uL — ABNORMAL LOW (ref 4.22–5.81)
RDW: 16.9 % — ABNORMAL HIGH (ref 11.5–15.5)
WBC: 5.7 10*3/uL (ref 4.0–10.5)
nRBC: 0 % (ref 0.0–0.2)

## 2019-12-12 LAB — GLUCOSE, CAPILLARY
Glucose-Capillary: 122 mg/dL — ABNORMAL HIGH (ref 70–99)
Glucose-Capillary: 160 mg/dL — ABNORMAL HIGH (ref 70–99)
Glucose-Capillary: 104 mg/dL — ABNORMAL HIGH (ref 70–99)
Glucose-Capillary: 146 mg/dL — ABNORMAL HIGH (ref 70–99)

## 2019-12-12 LAB — BASIC METABOLIC PANEL
Anion gap: 8 (ref 5–15)
BUN: 13 mg/dL (ref 8–23)
CO2: 34 mmol/L — ABNORMAL HIGH (ref 22–32)
Calcium: 9.2 mg/dL (ref 8.9–10.3)
Chloride: 98 mmol/L (ref 98–111)
Creatinine, Ser: 0.97 mg/dL (ref 0.61–1.24)
GFR calc Af Amer: >60 mL/min (ref >60)
GFR calc non Af Amer: >60 mL/min (ref >60)
Glucose, Bld: 123 mg/dL — ABNORMAL HIGH (ref 70–99)
Potassium: 3.8 mmol/L (ref 3.5–5.1)
Sodium: 140 mmol/L (ref 135–145)

## 2019-12-12 LAB — SARS CORONAVIRUS 2 BY RT PCR (HOSPITAL ORDER, PERFORMED IN ~~LOC~~ HOSPITAL LAB): SARS Coronavirus 2: NEGATIVE

## 2019-12-12 LAB — MAGNESIUM: Magnesium: 1.7 mg/dL (ref 1.7–2.4)

## 2019-12-12 MED ORDER — SODIUM CHLORIDE 0.9 % IV SOLN
2.0000 g | INTRAVENOUS | Status: DC
  Administered 2019-12-13: 2 g via INTRAVENOUS
  Filled 2019-12-12: qty 2

## 2019-12-12 NOTE — Progress Notes (Signed)
Physical Therapy Treatment Patient Details Name: Kevin [name]. MRN: [id number] DOB: 28-Mar-1951 Today's Date: 12/12/2019    History of Present Illness Patient is a 69 year old male with history of hypertension, morbid obesity, type 2 diabetes mellitus, asthma, hyperlipidemia, heart block status post pacemaker placement, OSA who presented from home with 1 week history of decreased appetite, poor oral intake, diarrhea, abdomen/chest pain.  On presentation, he was found to be in septic shock and was started on vasopressors and was transferred to ICU.  Work-up revealed cervical ventral epidural abscess. Hospital course complicated by acute hypoxic respiratory failure secondary to pulmonary edema requiring up to 15L of high flow nasal cannula. Pt now down to 1 L    PT Comments    Pt making good progress and has met some of his goals.  Goals were updated.  Pt did find out today that his insurance denied CIR and was somewhat disappointed but reports he is agreeable to SNF to progress.  Pt was able to increase gait to 40' with RW and min guard on 1 LPM with stable O2 sats.  Required min cues for RW use.  Pt had c/o neck and shoulder pain - educated on posture, cervical and scapular retraction.    Follow Up Recommendations  SNF     Equipment Recommendations  Rolling walker with 5" wheels;3in1 (PT)    Recommendations for Other Services       Precautions / Restrictions Precautions Precautions: Fall Precaution Comments: monitor SpO2  Restrictions Weight Bearing Restrictions: No    Mobility  Bed Mobility               General bed mobility comments: in chair  Transfers Overall transfer level: Needs assistance Equipment used: Rolling walker (2 wheeled) Transfers: Sit to/from Stand Sit to Stand: Supervision Stand pivot transfers: Min guard       General transfer comment: min Guard for safety; performed x 2  Ambulation/Gait Ambulation/Gait assistance: Min guard Gait Distance  (Feet): 40 Feet (40'x2) Assistive device: Rolling walker (2 wheeled) Gait Pattern/deviations: Decreased stride length;Shuffle;Trunk flexed Gait velocity: slowed    General Gait Details: cues for RW proximity and posture; required 1 seated rest break   Stairs             Wheelchair Mobility    Modified Rankin (Stroke Patients Only)       Balance Overall balance assessment: Needs assistance Sitting-balance support: No upper extremity supported Sitting balance-Leahy Scale: Good     Standing balance support: Bilateral upper extremity supported Standing balance-Leahy Scale: Poor Standing balance comment: reliant on UE support on RW                             Cognition Arousal/Alertness: Awake/alert Behavior During Therapy: WFL for tasks assessed/performed Overall Cognitive Status: Within Functional Limits for tasks assessed                                        Exercises Other Exercises Other Exercises: 10x cervical retraction and scapular retraction    General Comments General comments (skin integrity, edema, etc.): Pt on 1 LPM O2 with sats 97-98% rest; 94% walking; DOE 2/4 with walking      Pertinent Vitals/Pain Pain Score: 2  Faces Pain Scale: Hurts a little bit Pain Location: neck and L shoulder Pain Descriptors / Indicators: Nagging;Dull Pain Intervention(s):  Repositioned;Limited activity within patient's tolerance;Relaxation;Other (comment) (educated on chin tucks and shoulder retraction)    Home Living                      Prior Function            PT Goals (current goals can now be found in the care plan section) Acute Rehab PT Goals Patient Stated Goal: Patient is wanting to move to next level to continue progression. PT Goal Formulation: With patient Time For Goal Achievement: 12/26/19 Potential to Achieve Goals: Good Progress towards PT goals: Progressing toward goals    Frequency    Min  2X/week      PT Plan Discharge plan needs to be updated;Frequency needs to be updated (insurance denied CIR)    Co-evaluation              AM-PAC PT "6 Clicks" Mobility   Outcome Measure  Help needed turning from your back to your side while in a flat bed without using bedrails?: A Little Help needed moving from lying on your back to sitting on the side of a flat bed without using bedrails?: A Little Help needed moving to and from a bed to a chair (including a wheelchair)?: None Help needed standing up from a chair using your arms (e.g., wheelchair or bedside chair)?: None Help needed to walk in hospital room?: A Little Help needed climbing 3-5 steps with a railing? : A Lot 6 Click Score: 19    End of Session Equipment Utilized During Treatment: Oxygen;Gait belt Activity Tolerance: Patient tolerated treatment well Patient left: with call bell/phone within reach;in chair Nurse Communication: Mobility status PT Visit Diagnosis: Unsteadiness on feet (R26.81);Other abnormalities of gait and mobility (R26.89);Muscle weakness (generalized) (M62.81)     Time: 1610-9604 PT Time Calculation (min) (ACUTE ONLY): 20 min  Charges:  $Gait Training: 8-22 mins                     Abran Richard, PT Acute Rehab Services Pager [phone] [facility] 12/12/2019, 2:17 PM

## 2019-12-12 NOTE — Progress Notes (Signed)
Inpatient Rehabilitation Admissions Coordinator  I have notified Dr. Tyson Babinski that Fransico Him MD would like a peer to peer with acute MD today to determine CIR approval vs SNF. I will follow up after that peer to peer.  Ottie Glazier, RN, MSN Rehab Admissions Coordinator [phone] 12/12/2019 9:08 AM

## 2019-12-12 NOTE — Progress Notes (Signed)
Inpatient Rehabilitation Admissions Coordinator  Insurance has denied CIR approval. I met with patient at bedside and he is aware. We will sign off at this time.  Danne Baxter, RN, MSN Rehab Admissions Coordinator [phone] 12/12/2019 1:20 PM

## 2019-12-12 NOTE — Care Management Important Message (Signed)
Important Message  Patient Details  Name: Kevin [name]. MRN: [id number] Date of Birth: Aug 17, 1950   Medicare Important Message Given:  Yes     Renie Ora 12/12/2019, 11:39 AM

## 2019-12-12 NOTE — Progress Notes (Signed)
PHARMACY CONSULT NOTE FOR:  OUTPATIENT  PARENTERAL ANTIBIOTIC THERAPY (OPAT)  Indication: streptococcus intermedius bacteremia / epidural abscess Regimen: cetriaxone 2g IV q24h  End date: 01/16/2020  IV antibiotic discharge orders are pended. To discharging provider:  please sign these orders via discharge navigator,  Select New Orders & click on the button choice - Manage This Unsigned Work.    The patient was on IV penicillin and had increased potassium levels, so penicillin was changed to ceftriaxone 2g IV q12h.  Since we are treating bacteremia with abscess, we can decrease the ceftriaxone to daily administration as central nervous system infection not present and the organism is completely sensitive to penicillin and ceftriaxone.  Thank you for allowing pharmacy to be a part of this patient's care.  Mickeal Skinner 12/12/2019, 10:19 AM

## 2019-12-12 NOTE — Progress Notes (Addendum)
PROGRESS NOTE    Kevin L Absher Jr.  GXQ:[id number] DOB: 06/02/1950 DOA: 11/22/2019 PCP: Malka So., MD   Brief Narrative:  Patient is a 69 year old male with history of hypertension, morbid obesity, type 2 diabetes mellitus, asthma, hyperlipidemia, heart block status post pacemaker placement, OSA who presented from home with 1 week history of decreased appetite, poor oral intake, diarrhea, abdomen/chest pain.  On presentation, he was found to be in septic shock and was started on vasopressors and was transferred to ICU.  Work-up revealed cervical ventral epidural abscess.  Blood cultures showed Streptococcus intermedius.  ID and  neurosurgery were consulted.  He was transferred to [facility] service on 11/30/2019.  Hospital course complicated by acute hypoxic respiratory failure secondary to pulmonary edema requiring up to 5 to 10 L of high flow nasal cannula.  Received Lasix for volume overload.  His respiratory status has  improved.  PT/OT recommending CIR on discharge.  Patient is stable for discharge to CIR/SNF as soon as bed is available.  Assessment & Plan:    Active Problems:   Hypotension   Pressure injury of skin   Palliative care by specialist   Goals of care, counseling/discussion   DNR (do not resuscitate) discussion   Hypoxia   Acute hypoxemic respiratory failure (HCC)   Weakness generalized   Shortness of breath    Acute respiratory failure with hypoxia/hypercarbia: Likely multifactorial, secondary to atelectasis, acute pulmonary edema with history of obstructive sleep apnea.  Has improved at this time.  Currently on 1 L of nasal cannula oxygen.  CT angiogram of the chest did not show any pulmonary embolism.  Not on oxygen at home.  We will continue to wean oxygen as able.   Septic shock with strep bacteremia: Secondary to cervical ventral epidural abscess.  Blood cultures showed Streptococcus intermedius.  Patient was initially on IV penicillin which has been changed to IV  Rocephin due to hyperkalemia.  Infectious disease on board.  Plan is to continue 8 weeks of IV antibiotic until10/18/2021. TEE done on 8/30 did not show any evidence of endocarditis or pacemaker wire infection.  Cervical ventral epidural abscess: No surgical intervention done.  Neurosurgery on board and plan is to repeat MRI in 2 weeks on 12/14/2019.  Continue supportive care including gabapentin and tramadol.  Continue physical therapy.  Diabetes type 2 with hyperglycemia: Hemoglobin A1c of 7.1.  Continue current insulin regimen.  Takes Metformin, semaglutide at home.  Overall adequately controlled.  Last POC glucose of 160.  Mild hypokalemia.  Potassium of 3.8.  Received 1 dose of p.o. potassium yesterday..    Hyperlipidemia: Resumed on Lipitor.  Morbid obesity: BMI of 41.  Patient would benefit from continued weight loss as outpatient..  Sleep apnea: On CPAP at home.  Not consistent on using it.  Stage II sacral ulcer: Present on admission.  Continue local wound care.  Hyperkalemia: Resolved with Lokelma.  Constipation: Continue MiraLAX and Senokot as necessary..  Essential hypertension: Currently on oral Lasix.  Patient takes Coreg and HCTZ at home.  Coreg on hold at this time.  Blood pressure is borderline low so we will hold off with Coreg and HCTZ..  Lower extremity edema: Likely secondary to aggressive fluid hydration.  On oral Lasix.  2D echo done on 10/2019 showed LVEF of 60-65%,normal diastolic parameters. He is on HCTZ at home, which will be resumed on discharge.  Goals of care: Palliative care for the patient during hospitalization.  Partial code.  No CPR/intubation but okay  to ACLS medications/vasopressors.  Deconditioning/debility: Patient was seen by physical therapy and Occupational Therapy and recommended CIR.    Addendum:  12/12/2019 12:55 PM  Peer to peer  review done for CIR versus SNF.  Patient has been approved for SNF and CIR has been declined.  Updated rehab  team.  DVT prophylaxis: Heparin [place]     Code Status:  Partial code  Family Communication:  Spoke with the patient's spouse Ms. Jola Babinski on the phone and updated her about the clinical condition of the patient and the plan for disposition likely to skilled nursing facility.    Status is: Inpatient  Remains inpatient appropriate because:Inpatient level of care appropriate due to severity of illness  Dispo:  Patient From: Home  Planned Disposition: Inpatient Rehab/snf  Expected discharge date: 12/13/19  Medically stable for discharge: Yes,  MRI has been recommended by neurosurgery for follow up.   Consultants:  Infectious disease, neurosurgery  Procedures: PICC line placement on 11/29/2019.  Antimicrobials:  Rocephin iv   Anti-infectives (From admission, onward)   Start     Dose/Rate Route Frequency Ordered Stop   12/10/19 1000  cefTRIAXone (ROCEPHIN) 2 g in sodium chloride 0.9 % 100 mL IVPB        2 g 200 mL/hr over 30 Minutes Intravenous Every 12 hours 12/10/19 0857     11/29/19 1700  penicillin G potassium 12 Million Units in dextrose 5 % 500 mL continuous infusion  Status:  Discontinued        12 Million Units 41.7 mL/hr over 12 Hours Intravenous Every 12 hours 11/29/19 1539 12/10/19 0857   11/24/19 1530  cefTRIAXone (ROCEPHIN) 2 g in sodium chloride 0.9 % 100 mL IVPB  Status:  Discontinued        2 g 200 mL/hr over 30 Minutes Intravenous Every 12 hours 11/24/19 1522 11/29/19 1539   11/24/19 1400  ceFEPIme (MAXIPIME) 2 g in sodium chloride 0.9 % 100 mL IVPB  Status:  Discontinued        2 g 200 mL/hr over 30 Minutes Intravenous Every 8 hours 11/24/19 0859 11/24/19 1522   11/23/19 2200  vancomycin (VANCOREADY) IVPB 1750 mg/350 mL  Status:  Discontinued        1,750 mg 175 mL/hr over 120 Minutes Intravenous Every 24 hours 11/23/19 0917 11/24/19 1013   11/23/19 1800  ceFEPIme (MAXIPIME) 2 g in sodium chloride 0.9 % 100 mL IVPB  Status:  Discontinued        2 g 200 mL/hr  over 30 Minutes Intravenous Every 12 hours 11/23/19 0924 11/24/19 0859   11/23/19 0400  aztreonam (AZACTAM) 2 g in sodium chloride 0.9 % 100 mL IVPB  Status:  Discontinued        2 g 200 mL/hr over 30 Minutes Intravenous Every 8 hours 11/22/19 1719 11/23/19 0921   11/22/19 1730  vancomycin (VANCOREADY) IVPB 1500 mg/300 mL        1,500 mg 150 mL/hr over 120 Minutes Intravenous  Once 11/22/19 1715 11/22/19 2024   11/22/19 1718  vancomycin variable dose per unstable renal function (pharmacist dosing)  Status:  Discontinued         Does not apply See admin instructions 11/22/19 1719 11/24/19 0859   11/22/19 1645  aztreonam (AZACTAM) 2 g in sodium chloride 0.9 % 100 mL IVPB        2 g 200 mL/hr over 30 Minutes Intravenous  Once 11/22/19 1636 11/22/19 2116   11/22/19 1645  metroNIDAZOLE (FLAGYL) IVPB 500  mg        500 mg 100 mL/hr over 60 Minutes Intravenous  Once 11/22/19 1636 11/22/19 1813   11/22/19 1645  vancomycin (VANCOCIN) IVPB 1000 mg/200 mL premix        1,000 mg 200 mL/hr over 60 Minutes Intravenous  Once 11/22/19 1636 11/22/19 1813      Subjective: Today, patient was seen and examined at bedside.  Patient denies any shortness of breath, cough or fever.  No headache nausea or vomiting.    Objective: Vitals:   12/12/19 0029 12/12/19 0504 12/12/19 0747 12/12/19 0830  BP: 99/74 98/79 104/65 106/63  Pulse: 99 68 77   Resp: 18 18 18    Temp: 99.1 F (37.3 C) 98.5 F (36.9 C) 98.7 F (37.1 C)   TempSrc: Oral Oral Oral   SpO2: 95% 97% 92% 93%  Weight:  126.6 kg    Height:        Intake/Output Summary (Last 24 hours) at 12/12/2019 1000 Last data filed at 12/12/2019 0945 Gross per 24 hour  Intake 1561.24 ml  Output 850 ml  Net 711.24 ml   Filed Weights   12/10/19 0400 12/11/19 0508 12/12/19 0504  Weight: 128 kg 127.6 kg 126.6 kg   Body mass index is 40.03 kg/m.   Examination:  General: Morbidly obese, not in obvious distress, alert awake communicative HENT:   No  scleral pallor or icterus noted. Oral mucosa is moist.  Chest:   Diminished breath sounds bilaterally. No crackles or wheezes.  CVS: S1 &S2 heard. No murmur.  Regular rate and rhythm. Abdomen: Soft, nontender, nondistended.  Bowel sounds are heard.   Extremities: No cyanosis, clubbing but bilateral lower extremity pitting edema ++noted.  Right upper extremity PICC line in place.  Peripheral pulses are palpable. Psych: Alert, awake and oriented, normal mood CNS:  No cranial nerve deficits.  Power equal in all extremities.   Skin: Warm and dry.  No rashes noted.   Data Reviewed: I have personally reviewed following labs and imaging studies  CBC: Recent Labs  Lab 12/08/19 0427 12/11/19 0555 12/12/19 0330  WBC 4.4 5.0 5.7  NEUTROABS 2.5 3.0  --   HGB 9.0* 9.7* 10.1*  HCT 29.5* 30.8* 33.1*  MCV 98.7 98.7 98.8  PLT 290 263 286   Basic Metabolic Panel: Recent Labs  Lab 12/08/19 0427 12/08/19 0427 12/09/19 0500 12/09/19 0917 12/09/19 1815 12/10/19 0500 12/10/19 1633 12/11/19 0555 12/12/19 0330  NA 138  --  134*  --   --  134*  --  140 140  K 4.4   < > 5.7*   < > 6.0* 5.7* 3.6 3.4* 3.8  CL 98  --  95*  --   --  97*  --  96* 98  CO2 35*  --  32  --   --  29  --  35* 34*  GLUCOSE 126*  --  233*  --   --  268*  --  121* 123*  BUN 14  --  14  --   --  13  --  8 13  CREATININE 0.83  --  0.96  --   --  0.92  --  0.97 0.97  CALCIUM 9.1  --  8.9  --   --  8.6*  --  9.1 9.2  MG  --   --   --   --   --   --   --   --  1.7   < > =  values in this interval not displayed.   GFR: Estimated Creatinine Clearance: 93.3 mL/min (by C-G formula based on SCr of 0.97 mg/dL). Liver Function Tests: No results for input(s): AST, ALT, ALKPHOS, BILITOT, PROT, ALBUMIN in the last 168 hours. No results for input(s): LIPASE, AMYLASE in the last 168 hours. No results for input(s): AMMONIA in the last 168 hours. Coagulation Profile: No results for input(s): INR, PROTIME in the last 168 hours. Cardiac  Enzymes: No results for input(s): CKTOTAL, CKMB, CKMBINDEX, TROPONINI in the last 168 hours. BNP (last 3 results) No results for input(s): PROBNP in the last 8760 hours. HbA1C: No results for input(s): HGBA1C in the last 72 hours. CBG: Recent Labs  Lab 12/11/19 0733 12/11/19 1155 12/11/19 1712 12/11/19 2009 12/12/19 0643  GLUCAP 124* 107* 177* 139* 122*   Lipid Profile: No results for input(s): CHOL, HDL, LDLCALC, TRIG, CHOLHDL, LDLDIRECT in the last 72 hours. Thyroid Function Tests: No results for input(s): TSH, T4TOTAL, FREET4, T3FREE, THYROIDAB in the last 72 hours. Anemia Panel: No results for input(s): VITAMINB12, FOLATE, FERRITIN, TIBC, IRON, RETICCTPCT in the last 72 hours. Sepsis Labs: No results for input(s): PROCALCITON, LATICACIDVEN in the last 168 hours.  No results found for this or any previous visit (from the past 240 hour(s)).     Radiology Studies: No results found.    Scheduled Meds: . arformoterol  15 mcg Nebulization BID  . atorvastatin  20 mg Oral Daily  . budesonide (PULMICORT) nebulizer solution  0.5 mg Nebulization BID  . Chlorhexidine Gluconate Cloth  6 each Topical Daily  . furosemide  40 mg Oral Daily  . gabapentin  100 mg Oral TID  . heparin  5,000 Units Subcutaneous Q8H  . hydrOXYzine  25 mg Oral Once  . insulin aspart  0-20 Units Subcutaneous TID WC  . ipratropium-albuterol  3 mL Nebulization BID  . mouth rinse  15 mL Mouth Rinse BID  . melatonin  3 mg Oral QHS  . senna-docusate  2 tablet Oral BID  . sodium chloride flush  10-40 mL Intracatheter Q12H   Continuous Infusions: . sodium chloride Stopped (12/05/19 1015)  . cefTRIAXone (ROCEPHIN)  IV 2 g (12/12/19 0901)     LOS: 20 days    Joycelyn Das, MD [facility]

## 2019-12-12 NOTE — Progress Notes (Signed)
Occupational Therapy Treatment Patient Details Name: Kevin [name]. MRN: [id number] DOB: [birth year] Today's Date: 12/12/2019    History of present illness 69 year old male brought to the emergency room for evaluation of a 1 to 1-1/2-week history of worsening lethargy malaise decreased p.o. intake. 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 Pt found to have acute encephalopathy in setting of hypercarbia, sepsis   OT comments  Patient presents with improved activity tolerance and O2 requirement from 2L to 1L via [place] with sats > 94% throughout.  Overall generalized weakness, stand balance deficits, decreased activity tolerance to sustained activities, and O2 reliance, continue to limit functional independence compared to prior level of function. He continues to participate well, and is anxious to get to the next level of care to continue his rehab.  Continue to follow in the acute setting, discharge recommendations remain appropriate.    Follow Up Recommendations  CIR    Equipment Recommendations  Other (comment)    Recommendations for Other Services Rehab consult    Precautions / Restrictions Precautions Precautions: Fall Restrictions Weight Bearing Restrictions: No       Mobility Bed Mobility                  Transfers Overall transfer level: Needs assistance Equipment used: Rolling walker (2 wheeled) Transfers: Sit to/from Stand Sit to Stand: Supervision Stand pivot transfers: Supervision            Balance Overall balance assessment: Needs assistance         Standing balance support: Bilateral upper extremity supported;During functional activity Standing balance-Leahy Scale: Fair Standing balance comment: reliant on UE support on RW                            ADL either performed or assessed with clinical judgement   ADL               Lower Body  Bathing: Minimal assistance;Sit to/from stand           Toilet Transfer: Supervision/safety;Ambulation;RW                                                                                                       Pertinent Vitals/ Pain       Pain Score: 2  Pain Location: neck and L shoulder Pain Descriptors / Indicators: Nagging;Dull Pain Intervention(s): Repositioned                                                          Frequency  Min 2X/week        Progress Toward Goals  OT Goals(current goals can now be found in the care plan section)  Progress towards OT goals: Progressing toward goals  Acute Rehab OT Goals Patient Stated Goal: Patient is wanting to  move to next level to continue progression. OT Goal Formulation: With patient Time For Goal Achievement: 12/23/19 Potential to Achieve Goals: Good ADL Goals Pt Will Perform Grooming: with set-up;standing Pt Will Perform Upper Body Bathing: with set-up;standing Pt Will Perform Lower Body Bathing: with set-up;sit to/from stand Pt Will Perform Lower Body Dressing: with set-up;sit to/from stand Pt Will Transfer to Toilet: with modified independence;ambulating Pt Will Perform Toileting - Clothing Manipulation and hygiene: with modified independence;sit to/from stand  Plan Discharge plan remains appropriate    Co-evaluation                 AM-PAC OT "6 Clicks" Daily Activity     Outcome Measure   Help from another person eating meals?: None Help from another person taking care of personal grooming?: A Little Help from another person toileting, which includes using toliet, bedpan, or urinal?: A Little Help from another person bathing (including washing, rinsing, drying)?: A Lot Help from another person to put on and taking off regular upper body clothing?: A Little Help from another person to put on and taking off regular lower body clothing?: A  Lot 6 Click Score: 17    End of Session Equipment Utilized During Treatment: Rolling walker  OT Visit Diagnosis: Unsteadiness on feet (R26.81);Other abnormalities of gait and mobility (R26.89);Muscle weakness (generalized) (M62.81);Pain;Other symptoms and signs involving cognitive function   Activity Tolerance Patient tolerated treatment well   Patient Left in chair;with call bell/phone within reach   Nurse Communication          Time: 0626-9485 OT Time Calculation (min): 20 min  Charges: OT General Charges $OT Visit: 1 Visit OT Treatments $Self Care/Home Management : 8-22 mins  12/12/2019  Rich, OTR/L  Acute Rehabilitation Services  Office:  [phone]    Kevin [name] 12/12/2019, 12:45 PM

## 2019-12-12 NOTE — TOC Progression Note (Addendum)
Transition of Care (TOC) - Progression Note    Patient Details  Name: Kevin [name]. MRN: [id number] Date of Birth: September 19, 1950  Transition of Care [facility]) CM/SW Contact  Terrial Rhodes, [place] Phone Number: 12/12/2019, 2:22 PM  Clinical Narrative:     Update 9/13 4:20pm- Insurance authorization has been approved for SNF placement. [employer] ID # [id number]. Auth ID# [id number]. Next review date is 9/15.  Patient has SNF bed at [facility]. Insurance authorization has been approved.  CSW will continue to follow.  Update 9/13 2:35pm CSW started insurance authorization for patient. Reference number is #[id number].  Patient has SNF bed at [facility].  Insurance denied patient for CIR placement. CSW called [facility] to see if they could take patient for SNF placement today. Everardo Pacific with [facility] said she will have a SNF bed available tomorrow before lunch for patient. CSW spoke with patient and patent is agreeable to SNF placement tomorrow at [facility]. CSW will start insurance authorization for patient.  Patient has SNF bed at [facility].  CSW will continue to follow.   Barriers to Discharge: Continued Medical Work up  Expected Discharge Plan and Services         Living arrangements for the past 2 months: Single Family Home                                       Social Determinants of Health (SDOH) Interventions    Readmission Risk Interventions No flowsheet data found.

## 2019-12-13 ENCOUNTER — Inpatient Hospital Stay (HOSPITAL_COMMUNITY): Payer: Medicare Other

## 2019-12-13 LAB — GLUCOSE, CAPILLARY
Glucose-Capillary: 175 mg/dL — ABNORMAL HIGH (ref 70–99)
Glucose-Capillary: 120 mg/dL — ABNORMAL HIGH (ref 70–99)
Glucose-Capillary: 115 mg/dL — ABNORMAL HIGH (ref 70–99)

## 2019-12-13 MED ORDER — SENNOSIDES-DOCUSATE SODIUM 8.6-50 MG PO TABS: 2.0000 | ORAL_TABLET | Freq: Two times a day (BID) | ORAL | Status: AC

## 2019-12-13 MED ORDER — BISACODYL 10 MG RE SUPP: 10.0000 mg | Freq: Every day | RECTAL | Status: AC | PRN

## 2019-12-13 MED ORDER — GADOBUTROL 1 MMOL/ML IV SOLN
10.0000 mL | Freq: Once | INTRAVENOUS | Status: AC | PRN
  Administered 2019-12-13: 10 mL via INTRAVENOUS

## 2019-12-13 MED ORDER — DIPHENHYDRAMINE HCL 25 MG PO CAPS: 25.0000 mg | ORAL_CAPSULE | Freq: Three times a day (TID) | ORAL | 0 refills | Status: AC | PRN

## 2019-12-13 MED ORDER — MELATONIN 3 MG PO TABS: 3.0000 mg | ORAL_TABLET | Freq: Every day | ORAL | 0 refills | Status: AC

## 2019-12-13 MED ORDER — HEPARIN SOD (PORK) LOCK FLUSH 100 UNIT/ML IV SOLN
250.0000 [IU] | INTRAVENOUS | Status: AC | PRN
  Administered 2019-12-13 (×2): 250 [IU]
  Filled 2019-12-13: qty 2.5

## 2019-12-13 MED ORDER — ACETAMINOPHEN 325 MG PO TABS: 650.0000 mg | ORAL_TABLET | Freq: Four times a day (QID) | ORAL | Status: AC | PRN

## 2019-12-13 MED ORDER — ARFORMOTEROL TARTRATE 15 MCG/2ML IN NEBU: 15.0000 ug | INHALATION_SOLUTION | Freq: Two times a day (BID) | RESPIRATORY_TRACT | Status: AC

## 2019-12-13 MED ORDER — ALBUTEROL SULFATE (2.5 MG/3ML) 0.083% IN NEBU: 2.5000 mg | INHALATION_SOLUTION | RESPIRATORY_TRACT | Status: AC | PRN

## 2019-12-13 MED ORDER — CARVEDILOL 6.25 MG PO TABS: 6.2500 mg | ORAL_TABLET | Freq: Two times a day (BID) | ORAL | Status: AC

## 2019-12-13 MED ORDER — FUROSEMIDE 40 MG PO TABS: 40.0000 mg | ORAL_TABLET | Freq: Every day | ORAL | 0 refills | Status: AC

## 2019-12-13 MED ORDER — POLYETHYLENE GLYCOL 3350 17 G PO PACK: 17.0000 g | PACK | Freq: Every day | ORAL | Status: DC | PRN

## 2019-12-13 MED ORDER — TRAMADOL HCL 50 MG PO TABS
50.0000 mg | ORAL_TABLET | Freq: Once | ORAL | 0 refills | Status: AC | PRN
Start: 2019-12-13 — End: ?

## 2019-12-13 MED ORDER — IPRATROPIUM-ALBUTEROL 0.5-2.5 (3) MG/3ML IN SOLN: 3.0000 mL | Freq: Two times a day (BID) | RESPIRATORY_TRACT | Status: AC

## 2019-12-13 MED ORDER — HYDROCHLOROTHIAZIDE 25 MG PO TABS: 25.0000 mg | ORAL_TABLET | Freq: Every morning | ORAL | Status: AC

## 2019-12-13 MED ORDER — CEFTRIAXONE IV (FOR PTA / DISCHARGE USE ONLY): 2.0000 g | INTRAVENOUS | 0 refills | Status: DC

## 2019-12-13 MED ORDER — GABAPENTIN 100 MG PO CAPS: 100.0000 mg | ORAL_CAPSULE | Freq: Three times a day (TID) | ORAL | Status: AC

## 2019-12-13 NOTE — Discharge Summary (Signed)
Physician Discharge Summary  Hershal L Gerda Diss Brooke Bonito. IWP:[id number] DOB: 1950-09-11 DOA: 11/22/2019  PCP: Lilian Coma., MD  Admit date: 11/22/2019 Discharge date: 12/13/2019  Admitted From: Home  Discharge disposition: SNF  Recommendations for Outpatient Follow-Up:   . Follow up with your primary care provider at the skilled nursing facility in 3 to 5 days. . Check CBC, BMP, magnesium in the next visit . Follow-up with Dr. Reatha Armour, neurosurgery in 3 to 4 weeks.  Patient does have epidural abscess which is improving but would benefit from neurosurgery follow-up as outpatient. . Follow-up with infectious disease Dr. Linus Salmons in 4 weeks after completion of IV antibiotics. . Patient is on 1 L of oxygen by nasal cannula.  Can be weaned off in the next few days.  Oral Lasix has been given for next 5 days which could be discontinued once his edema improved.  Please restart HCTZ after 5-day course of Lasix.  Discharge Diagnosis:   Active Problems:   Hypotension   Pressure injury of skin   Palliative care by specialist   Goals of care, counseling/discussion   DNR (do not resuscitate) discussion   Hypoxia   Acute hypoxemic respiratory failure (HCC)   Weakness generalized   Shortness of breath   Discharge Condition: Improved.  Diet recommendation: .  Carbohydrate-modified.    Wound care: None.  Code status: Partial code   History of Present Illness:   Patient is a 69 year old male with history of hypertension, morbid obesity, type 2 diabetes mellitus, asthma, hyperlipidemia, heart block status post pacemaker placement, OSA who presented from home with 1 week history of decreased appetite, poor oral intake, diarrhea, abdomen/chest pain.  On presentation, he was found to be in septic shock and was started on vasopressors and was transferred to ICU.  Work-up revealed cervical ventral epidural abscess.  Blood cultures showed Streptococcus intermedius.  ID and  neurosurgery were consulted.  He  was transferred to [facility] service on 11/30/2019.  Hospital course complicated by acute hypoxic respiratory failure secondary to pulmonary edema requiring up to 5 to 10 L of high flow nasal cannula.  Received Lasix for volume overload with improvement he needs volume status.  At this time respiratory status has improved and patient is stable for disposition.  Hospital Course:   Following conditions were addressed during hospitalization as listed below,  Acute respiratory failure with hypoxia/hypercarbia: Likely multifactorial, secondary to atelectasis, acute pulmonary edema with history of obstructive sleep apnea.  Has improved at this time.  Currently on 1 L of nasal cannula oxygen.  Please wean off oxygen in the next few days. CT angiogram of the chest did not show any pulmonary embolism.  Lasix has been given for the next 5 days for his peripheral edema.    Septic shock with strep bacteremia: Secondary to cervical ventral epidural abscess.  Blood cultures showed Streptococcus intermedius.  Patient was initially on IV penicillin which was changed to IV Rocephin due to hyperkalemia.  Infectious disease followed the patient during hospitalization. Plan is to continue total of 8 weeks of IV antibiotic until10/18/2021. TEE done on 8/30 did not show any evidence of endocarditis or pacemaker wire infection.  Cervical ventral epidural abscess: No surgical intervention done.  Neurosurgery on was consulted and repeat MRI in 2 weeks on 12/13/2019 was done which showed improving epidural abscess.Marland Kitchen  Spoke with neurosurgery about it.  Recommended outpatient follow-up in 3 to 4 weeks. Continue gabapentin and tramadol for pain..  Continue physical therapy at the rehabilitation center  Diabetes type 2 with hyperglycemia: Hemoglobin A1c of 7.1.    Will resume Metformin, semaglutide from home.  Continue diabetic diet.  Mild hypokalemia.    Improved with replacement.  Latest potassium of 3.8.   Hyperlipidemia:  Continue  Lipitor.  Morbid obesity: BMI of 41.  Patient would benefit from continued weight loss as outpatient..  Sleep apnea: On CPAP at home.  Advised wearing CPAP at nighttime.  Stage II sacral ulcer: Present on admission.  Continue local wound care.  Constipation: Continue MiraLAX and Senokot as necessary especially on narcotics...  Essential hypertension: Currently on oral Lasix.  Patient takes Coreg and HCTZ at home.  Coreg will be continued with holding parameters.   Please restart hydrochlorothiazide on 12/19/2019 after completion of Lasix.  Lower extremity edema: Likely secondary to aggressive fluid hydration.    Will be discharged on 5-day course of Lasix followed by HCTZ. 2D echo done on 10/2019 showed LVEF of 32-20%,URKYHC diastolic parameters.  Goals of care: Palliative care for the patient during hospitalization.  Partial code.  No CPR/intubation but okay to ACLS medications/vasopressors.  Deconditioning/debility: Patient was seen by physical therapy and Occupational Therapy and recommended inpatient rehabilitation initially.  He was denied for inpatient rehab and has been accepted for subacute rehab at this time.  Disposition.  At this time, patient is stable for disposition to skilled nursing facility.  Spoke with the patient's spouse on the phone yesterday.  Medical Consultants:   Infectious disease, neurosurgery  Procedures:    PICC line placement on 11/29/2019.  Subjective:   Today, patient was seen and examined at bedside.  Patient denies any fever, chills or rigor.  Denies nausea, vomiting or abdominal pain.  Has mild neck pain.  Discharge Exam:   Vitals:   12/13/19 0821 12/13/19 0822  BP:    Pulse:    Resp:    Temp:    SpO2: 93% 96%   Vitals:   12/13/19 0415 12/13/19 0804 12/13/19 0821 12/13/19 0822  BP: 130/68 (!) 104/57    Pulse: 65 72    Resp: 17 16    Temp: 98.4 F (36.9 C) 97.8 F (36.6 C)    TempSrc: Axillary Oral    SpO2: 97% 94% 93% 96%    Weight: 129.9 kg     Height:       General: Alert awake, not in obvious distress, morbidly obese, HENT: pupils equally reacting to light,  No scleral pallor or icterus noted. Oral mucosa is moist.  Chest: .  Diminished breath sounds bilaterally. No crackles or wheezes.  CVS: S1 &S2 heard. No murmur.  Regular rate and rhythm. Abdomen: Soft, nontender, nondistended.  Bowel sounds are heard.   Extremities: No cyanosis, clubbing but bilateral lower extremity edema noted.  Peripheral pulses are palpable.  Right upper extremity PICC line in place. Psych: Alert, awake and oriented, normal mood CNS:  No cranial nerve deficits.  Power equal in all extremities.   Skin: Warm and dry.  No rashes noted.  The results of significant diagnostics from this hospitalization (including imaging, microbiology, ancillary and laboratory) are listed below for reference.     Diagnostic Studies:   US Abdomen Complete  Result Date: 11/22/2019 CLINICAL DATA:  Abdominal pain for 1 week. EXAM: ABDOMEN ULTRASOUND COMPLETE COMPARISON:  Abdominopelvic CT 08/12/2016 FINDINGS: Gallbladder: Partially distended. No gallstones or wall thickening visualized. No sonographic Murphy sign noted by sonographer. Common bile duct: Diameter: 4 mm, normal, limited visualization. Liver: Heterogeneously increased in parenchymal echogenicity. The liver parenchyma  is difficult to penetrate. No obvious focal lesion. Focal fatty sparing adjacent the gallbladder fossa. Portal vein is patent on color Doppler imaging with normal direction of blood flow towards the liver. IVC: Not well visualized due to habitus and bowel gas. Pancreas: Not well visualized due to habitus and bowel gas. Spleen: Size and appearance within normal limits. Right Kidney: Length: 10.9 cm. Echogenicity within normal limits. No mass or hydronephrosis visualized. Left Kidney: Length: 12.4 cm. Echogenicity within normal limits. No mass or hydronephrosis visualized. Abdominal  aorta: Not well visualized due to bowel gas and body habitus. Other findings: Foley catheter decompresses the urinary bladder which is not well assessed. There is no abdominopelvic ascites. Technically limited exam due to patient body habitus and bowel gas. IMPRESSION: 1. No sonographic explanation for abdominal pain. 2. Hepatic steatosis. 3. Gallbladder appears normal. No biliary dilatation. 4. Technically limited exam due to habitus and overlying bowel gas. Electronically Signed   By: Keith Rake M.D.   On: 11/22/2019 21:22   DG Chest Port 1 View  Result Date: 11/23/2019 CLINICAL DATA:  Shortness of breath, altered mental status EXAM: PORTABLE CHEST 1 VIEW COMPARISON:  Chest 11/22/2019 FINDINGS: Left-sided implanted cardiac device. Stable cardiomegaly. Interval development of diffuse interstitial opacities throughout both lungs. Streaky bibasilar consolidations persist. No pleural effusion. No pneumothorax. IMPRESSION: Interval development of diffuse interstitial opacities throughout both lungs, may reflect pulmonary edema versus multifocal infection. Electronically Signed   By: Davina Poke D.O.   On: 11/23/2019 08:33   DG Chest Port 1 View  Result Date: 11/22/2019 CLINICAL DATA:  Lethargy and weakness for several days EXAM: PORTABLE CHEST 1 VIEW COMPARISON:  08/01/2019 FINDINGS: Cardiac shadow remains enlarged. Pacing device is again seen. No focal infiltrate is seen. Mild atelectatic changes are noted in the bases bilaterally. No acute bony abnormality is noted. IMPRESSION: Mild bibasilar atelectasis. Electronically Signed   By: Inez Catalina M.D.   On: 11/22/2019 15:22   ECHOCARDIOGRAM LIMITED  Result Date: 11/23/2019    ECHOCARDIOGRAM LIMITED REPORT   Patient Name:   Arnold Kester IYMEBRAX Jr. Date of Exam: 11/23/2019 Medical Rec #:  [id number]          Height:       70.0 in Accession #:    [id number]         Weight:       390.0 lb Date of Birth:  1950-04-06          BSA:          2.772 m Patient  Age:    61 years           BP:           104/71 mmHg Patient Gender: M                  HR:           75 bpm. Exam Location:  Inpatient Procedure: Limited Echo, Cardiac Doppler, Color Doppler and Intracardiac            Opacification Agent Indications:    Dyspnea  History:        Patient has no prior history of Echocardiogram examinations.                 Pacemaker, Signs/Symptoms:Altered Mental Status; Risk                 Factors:Hypertension, Diabetes, Dyslipidemia and Morbid obesity.  Confusion.  Sonographer:    Dustin Flock Referring Phys: [place] Comments: Patient is morbidly obese and Technically challenging study due to limited acoustic windows. Image acquisition challenging due to uncooperative patient. IMPRESSIONS  1. Very limited study due to poor windows for echo and patient not being cooperative. Overall, LV function is normal 60-65% without WMA. RV appears mildly dilated with noraml function.  2. Left ventricular ejection fraction, by estimation, is 60 to 65%. The left ventricle has normal function. The left ventricle has no regional wall motion abnormalities. There is mild concentric left ventricular hypertrophy. Left ventricular diastolic parameters were normal.  3. Right ventricular systolic function is normal. The right ventricular size is mildly enlarged.  4. The mitral valve is grossly normal. No evidence of mitral valve regurgitation. No evidence of mitral stenosis.  5. The aortic valve was not well visualized. Aortic valve regurgitation is not visualized. No aortic stenosis is present. FINDINGS  Left Ventricle: Left ventricular ejection fraction, by estimation, is 60 to 65%. The left ventricle has normal function. The left ventricle has no regional wall motion abnormalities. Definity contrast agent was given IV to delineate the left ventricular  endocardial borders. The left ventricular internal cavity size was normal in size. There is mild  concentric left ventricular hypertrophy. Right Ventricle: The right ventricular size is mildly enlarged. No increase in right ventricular wall thickness. Right ventricular systolic function is normal. Pericardium: Trivial pericardial effusion is present. Presence of pericardial fat pad. Mitral Valve: The mitral valve is grossly normal. No evidence of mitral valve stenosis. Tricuspid Valve: The tricuspid valve is not well visualized. Aortic Valve: The aortic valve was not well visualized. Aortic valve regurgitation is not visualized. No aortic stenosis is present. Pulmonic Valve: The pulmonic valve was not well visualized. Aorta: The aortic root is normal in size and structure. Venous: The inferior vena cava was not well visualized. LEFT VENTRICLE PLAX 2D LVIDd:         6.30 cm  Diastology LVIDs:         4.70 cm  LV e' lateral:   9.90 cm/s LV PW:         1.40 cm  LV E/e' lateral: 7.1 LV IVS:        1.40 cm  LV e' medial:    6.74 cm/s LVOT diam:     2.80 cm  LV E/e' medial:  10.4 LVOT Area:     6.16 cm  LEFT ATRIUM         Index LA diam:    5.10 cm 1.84 cm/m   AORTA Ao Root diam: 3.70 cm MITRAL VALVE MV Area (PHT): 3.48 cm    SHUNTS MV Decel Time: 218 msec    Systemic Diam: 2.80 cm MV E velocity: 69.80 cm/s MV A velocity: 88.30 cm/s MV E/A ratio:  0.79 Eleonore Chiquito MD Electronically signed by Eleonore Chiquito MD Signature Date/Time: 11/23/2019/11:54:16 AM    Final      Labs:   Basic Metabolic Panel: Recent Labs  Lab 12/08/19 0427 12/08/19 0427 12/09/19 0500 12/09/19 0917 12/10/19 0500 12/10/19 1633 12/11/19 0555 12/12/19 0330  NA 138  --  134*  --  134*  --  140 140  K 4.4   < > 5.7*   < > 5.7*   < > 3.4* 3.8  CL 98  --  95*  --  97*  --  96* 98  CO2 35*  --  32  --  29  --  35* 34*  GLUCOSE 126*  --  233*  --  268*  --  121* 123*  BUN 14  --  14  --  13  --  8 13  CREATININE 0.83  --  0.96  --  0.92  --  0.97 0.97  CALCIUM 9.1  --  8.9  --  8.6*  --  9.1 9.2  MG  --   --   --   --   --   --    --  1.7   < > = values in this interval not displayed.   GFR Estimated Creatinine Clearance: 94.6 mL/min (by C-G formula based on SCr of 0.97 mg/dL). Liver Function Tests: No results for input(s): AST, ALT, ALKPHOS, BILITOT, PROT, ALBUMIN in the last 168 hours. No results for input(s): LIPASE, AMYLASE in the last 168 hours. No results for input(s): AMMONIA in the last 168 hours. Coagulation profile No results for input(s): INR, PROTIME in the last 168 hours.  CBC: Recent Labs  Lab 12/08/19 0427 12/11/19 0555 12/12/19 0330  WBC 4.4 5.0 5.7  NEUTROABS 2.5 3.0  --   HGB 9.0* 9.7* 10.1*  HCT 29.5* 30.8* 33.1*  MCV 98.7 98.7 98.8  PLT 290 263 286   Cardiac Enzymes: No results for input(s): CKTOTAL, CKMB, CKMBINDEX, TROPONINI in the last 168 hours. BNP: Invalid input(s): POCBNP CBG: Recent Labs  Lab 12/12/19 1128 12/12/19 1645 12/12/19 2036 12/13/19 0801 12/13/19 1226  GLUCAP 160* 104* 146* 175* 120*   D-Dimer No results for input(s): DDIMER in the last 72 hours. Hgb A1c No results for input(s): HGBA1C in the last 72 hours. Lipid Profile No results for input(s): CHOL, HDL, LDLCALC, TRIG, CHOLHDL, LDLDIRECT in the last 72 hours. Thyroid function studies No results for input(s): TSH, T4TOTAL, T3FREE, THYROIDAB in the last 72 hours.  Invalid input(s): FREET3 Anemia work up No results for input(s): VITAMINB12, FOLATE, FERRITIN, TIBC, IRON, RETICCTPCT in the last 72 hours. Microbiology Recent Results (from the past 240 hour(s))  SARS Coronavirus 2 by RT PCR (hospital order, performed in [facility] hospital lab) Nasopharyngeal Nasopharyngeal Swab     Status: None   Collection Time: 12/12/19  2:22 PM   Specimen: Nasopharyngeal Swab  Result Value Ref Range Status   SARS Coronavirus 2 NEGATIVE NEGATIVE Final    Comment: (NOTE) SARS-CoV-2 target nucleic acids are NOT DETECTED.  The SARS-CoV-2 RNA is generally detectable in upper and lower respiratory specimens during  the acute phase of infection. The lowest concentration of SARS-CoV-2 viral copies this assay can detect is 250 copies / mL. A negative result does not preclude SARS-CoV-2 infection and should not be used as the sole basis for treatment or other patient management decisions.  A negative result may occur with improper specimen collection / handling, submission of specimen other than nasopharyngeal swab, presence of viral mutation(s) within the areas targeted by this assay, and inadequate number of viral copies (<250 copies / mL). A negative result must be combined with clinical observations, patient history, and epidemiological information.  Fact Sheet for Patients:   StrictlyIdeas.no  Fact Sheet for Healthcare Providers: BankingDealers.co.za  This test is not yet approved or  cleared by the [place] FDA and has been authorized for detection and/or diagnosis of SARS-CoV-2 by FDA under an Emergency Use Authorization (EUA).  This EUA will remain in effect (meaning this test can be used) for the duration of the COVID-19 declaration under Section 564(b)(1) of the Act, 21 U.S.C. section  360bbb-3(b)(1), unless the authorization is terminated or revoked sooner.  Performed at [facility], [place] [address] [place],  [address]      Discharge Instructions:   Discharge Instructions    Call MD for:  persistant nausea and vomiting   Complete by: As directed    Call MD for:  severe uncontrolled pain   Complete by: As directed    Call MD for:  temperature >100.4   Complete by: As directed    Diet - low sodium heart healthy   Complete by: As directed    Diet Carb Modified   Complete by: As directed    Discharge instructions   Complete by: As directed    Continue to take medications as prescribe including antibiotics via PICC line. Please follow up with primary care provider at the skilled nursing facility in 3-5 days. Follow  up with infectious disease Dr Linus Salmons in 4 weeks. Follow up with neurosurgery in one week.   Increase activity slowly   Complete by: As directed    No wound care   Complete by: As directed      Allergies as of 12/13/2019      Reactions   Benazepril Swelling, Other (See Comments)   Lips only became swollen   Ibuprofen Other (See Comments)   Was told by MD to not take this   Mirabegron Other (See Comments), Cough   Congestion and wheezing, also   Amlodipine Swelling, Other (See Comments)   Leg edema    Cephalexin Itching   Molds & Smuts Other (See Comments)   "RUNNNING NOSE AND EYES"   Oxycodone Itching   Percodan [oxycodone-aspirin] Itching      Medication List    STOP taking these medications   cyclobenzaprine 5 MG tablet Commonly known as: FLEXERIL   methylPREDNISolone 4 MG tablet Commonly known as: MEDROL   tadalafil 5 MG tablet Commonly known as: CIALIS     TAKE these medications   acetaminophen 325 MG tablet Commonly known as: TYLENOL Take 2 tablets (650 mg total) by mouth every 6 (six) hours as needed for headache.   albuterol (2.5 MG/3ML) 0.083% nebulizer solution Commonly known as: PROVENTIL Take 3 mLs (2.5 mg total) by nebulization every 4 (four) hours as needed for wheezing or shortness of breath.   arformoterol 15 MCG/2ML Nebu Commonly known as: BROVANA Take 2 mLs (15 mcg total) by nebulization 2 (two) times daily.   aspirin EC 81 MG tablet Take 81 mg by mouth at bedtime.   atorvastatin 20 MG tablet Commonly known as: LIPITOR Take 20 mg by mouth daily.   augmented betamethasone dipropionate 0.05 % cream Commonly known as: DIPROLENE-AF Apply 1 application topically 2 (two) times daily as needed (to affected areas).   bisacodyl 10 MG suppository Commonly known as: DULCOLAX Place 1 suppository (10 mg total) rectally daily as needed for moderate constipation.   carvedilol 6.25 MG tablet Commonly known as: COREG Take 1 tablet (6.25 mg total) by  mouth 2 (two) times daily with a meal. Hold for SBP<100 and or HR<60 What changed: additional instructions   cefTRIAXone  IVPB Commonly known as: ROCEPHIN Inject 2 g into the vein daily. Indication:  Streptococcus intermedius bacteremia with epidural abscess First Dose: No Last Day of Therapy:  01/16/2020 Labs - Once weekly:  CBC/D and BMP, Labs - Every other week:  ESR and CRP Method of administration: IV Push   diphenhydrAMINE 25 mg capsule Commonly known as: BENADRYL Take 1 capsule (25  mg total) by mouth every 8 (eight) hours as needed for itching or sleep.   fluticasone 50 MCG/ACT nasal spray Commonly known as: FLONASE Place 2 sprays into both nostrils daily as needed for allergies or rhinitis.   furosemide 40 MG tablet Commonly known as: LASIX Take 1 tablet (40 mg total) by mouth daily for 5 days. Start taking on: December 14, 2019   gabapentin 100 MG capsule Commonly known as: NEURONTIN Take 1 capsule (100 mg total) by mouth 3 (three) times daily.   hydrochlorothiazide 25 MG tablet Commonly known as: HYDRODIURIL Take 1 tablet (25 mg total) by mouth in the morning. Start taking on: December 19, 2019 What changed: These instructions start on December 19, 2019. If you are unsure what to do until then, ask your doctor or other care provider.   ipratropium-albuterol 0.5-2.5 (3) MG/3ML Soln Commonly known as: DUONEB Take 3 mLs by nebulization 2 (two) times daily.   melatonin 3 MG Tabs tablet Take 1 tablet (3 mg total) by mouth at bedtime.   metFORMIN 500 MG 24 hr tablet Commonly known as: GLUCOPHAGE-XR Take 500 mg by mouth 2 (two) times daily after a meal.   omeprazole 20 MG capsule Commonly known as: PRILOSEC Take 20 mg by mouth daily before breakfast.   Ozempic (0.25 or 0.5 MG/DOSE) 2 MG/1.5ML Sopn Generic drug: Semaglutide(0.25 or 0.5MG/DOS) Inject 0.5 mg into the skin every 7 (seven) days.   polyethylene glycol 17 g packet Commonly known as: MIRALAX /  GLYCOLAX Take 17 g by mouth daily as needed for moderate constipation.   senna-docusate 8.6-50 MG tablet Commonly known as: Senokot-S Take 2 tablets by mouth 2 (two) times daily.   traMADol 50 MG tablet Commonly known as: ULTRAM Take 1 tablet (50 mg total) by mouth once as needed for moderate pain or severe pain.   urea 40 % Crea Commonly known as: CARMOL Apply 1 application topically daily as needed (for itching/irritation- as directed).   Voltaren 1 % Gel Generic drug: diclofenac Sodium Apply 2-4 g topically 4 (four) times daily as needed (to painful areas of shoulders and neck).   Wixela Inhub 250-50 MCG/DOSE Aepb Generic drug: Fluticasone-Salmeterol Inhale 1 puff into the lungs in the morning. What changed: Another medication with the same name was removed. Continue taking this medication, and follow the directions you see here.            Durable Medical Equipment  (From admission, onward)         Start     Ordered   12/02/19 0535  For home use only DME Walker rolling  Once       Question Answer Comment  Walker: With 5 Inch Wheels   Patient needs a walker to treat with the following condition Ambulatory dysfunction      12/02/19 0534   12/02/19 0535  For home use only DME 3 n 1  Once        12/02/19 0534                   Contact information for follow-up providers    Dawley, Troy C, DO Follow up in 1 week(s).   Why: call for appointment,  Contact information: [address] [place] [address] [phone]        Thayer Headings, MD. Schedule an appointment as soon as possible for a visit in 4 week(s).   Specialty: Infectious Diseases Why: infectious disease followup Contact information: [address]  [place] [address] [phone]            Contact information for after-discharge care    Destination    [facility] Preferred SNF .   Service: Skilled Nursing Contact information: [place] [phone]                   Time coordinating discharge: 39 minutes  Signed:  Ronnika Collett  [facility] 12/13/2019, 1:54 PM

## 2019-12-13 NOTE — TOC Transition Note (Signed)
Transition of Care [facility]) - CM/SW Discharge Note   Patient Details  Name: Kevin [name]. MRN: [id number] Date of Birth: January 20, 1951  Transition of Care [facility]) CM/SW Contact:  Terrial Rhodes, LCSWA Phone Number: 12/13/2019, 3:13 PM   Clinical Narrative:     Patient will DC to: [facility]  Anticipated DC date: 12/13/2019  Family notified: Kevin [name]  Transport by: Sharin Mons  ?  Per MD patient ready for DC to [facility]. RN, patient, patient's family, and facility notified of DC. Discharge Summary sent to facility. RN given number for report tele# [phone] [id number]. DC packet on chart. Ambulance transport requested for patient.  CSW signing off.  Final next level of care: Skilled Nursing Facility Barriers to Discharge: No Barriers Identified   Patient Goals and CMS Choice Patient states their goals for this hospitalization and ongoing recovery are:: to go to SNF CMS Medicare.gov Compare Post Acute Care list provided to:: Patient Choice offered to / list presented to : Patient  Discharge Placement              Patient chooses bed at: [facility] Patient to be transferred to facility by: [facility] Name of family member notified: Kevin [name] Patient and family notified of of transfer: 12/13/19  Discharge Plan and Services                                     Social Determinants of Health (SDOH) Interventions     Readmission Risk Interventions No flowsheet data found.

## 2019-12-13 NOTE — Progress Notes (Signed)
I attempted calling [facility] to give report to RN taking pt three separate times. All three times I was placed on hold for about 10 minutes each time. I left my number with the receptionist, Annice Pih, on the fourth attempt to have the RN give me a call when they are available.

## 2019-12-21 ENCOUNTER — Other Ambulatory Visit: Payer: Self-pay | Admitting: Neurological Surgery

## 2019-12-21 DIAGNOSIS — G062 Extradural and subdural abscess, unspecified: Secondary | ICD-10-CM

## 2019-12-26 ENCOUNTER — Other Ambulatory Visit (HOSPITAL_COMMUNITY): Payer: Self-pay | Admitting: Neurological Surgery

## 2019-12-26 DIAGNOSIS — G062 Extradural and subdural abscess, unspecified: Secondary | ICD-10-CM

## 2019-12-30 ENCOUNTER — Ambulatory Visit (INDEPENDENT_AMBULATORY_CARE_PROVIDER_SITE_OTHER): Payer: Medicare Other | Admitting: Infectious Diseases

## 2019-12-30 ENCOUNTER — Encounter: Payer: Self-pay | Admitting: Infectious Diseases

## 2019-12-30 ENCOUNTER — Other Ambulatory Visit: Payer: Self-pay

## 2019-12-30 ENCOUNTER — Telehealth: Payer: Self-pay

## 2019-12-30 VITALS — BP 119/73 | HR 70

## 2019-12-30 DIAGNOSIS — G061 Intraspinal abscess and granuloma: Secondary | ICD-10-CM

## 2019-12-30 NOTE — Telephone Encounter (Signed)
Error

## 2019-12-30 NOTE — Telephone Encounter (Signed)
Attempted to contact [facility], the rehab facility patient is currently at, to receive labs for Dr. Elinor Parkinson. Was put on hold and after being transferred was hung up on, will attempt to contact again at a later date. Kevin [name]

## 2019-12-30 NOTE — Telephone Encounter (Signed)
-----   Message from Odette Fraction, MD sent at 12/30/2019 11:13 AM EDT ----- Regarding: Labs I have not been recieveing his labs. Would like to have them.

## 2019-12-30 NOTE — Progress Notes (Signed)
[facility]                                                             [place], [place], [place], [address]                                                                  Phn. [phone]; Fax: [phone]                                                                             Date: 12/30/2019  Reason for Referral: Fu for epidural abscess   Assessment 1. Cervical epidural abscess ( extending from midportion of C2 to C5)     Phlegmon in the prevertebral/retropharyngeal abscess without abscess     C3-C4 and C4-C5 Discitis and Osteomyelitis 2. Strep Intermedius Bacteremia 3. Medication monitoiring  Plan Continue ceftriaxone 2 g iv daily for 8 weeks as initially planned. F/u MRI c spine on 10/21 I will follow up patient on 10/22 and will determine further continuation of IV abx based on MRI findings I have requested Labs from patient's SNF ESR and CRP today  All questions and concerns were discussed and addressed  I spent greater than 30 minutes with the patient including greater than 50% of time in face to face counsel of the patient and in coordination of their care.   Rosiland Oz, MD [facility]  Office phone [phone] Fax no. [phone] ______________________________________________________________________________________________________________________  HPI: 69 year old male with history of hypertension, morbid obesity, type 2 diabetes mellitus, asthma, hyperlipidemia, heart block status post pacemaker placement, OSA with recent hospital admission for ventral epidural abscess 8/24-9/14 who is here for a hospital follow up. Patient was found to be in septic shock and was started on vasopressors and was transferred to ICU. Work-up revealed cervical ventral epidural abscess. Blood cultures 2/2 sets 8/24 grew  Streptococcus intermedius,  cleared on 8/26. TTE 8/25( poor study) no vegetations. TEE 8/30 no vegetations noted in the valves and PMK leads. Per Nsx, no acute surgjcal intervention and fu MRI c spine in 2 weeks. Hospital course complicated by acute hypoxic respiratory failure secondary to pulmonary edema requiring up to 5 to 10 L of high flow nasal cannula. Received Lasix for volume overload with improvement he needs volume status. Patient was discharged on 9/14 to SNF.   Patient is accompanied by his wife today. Says that he has been doing well. Getting IV abx through the RT arm PICC line. He is still in the SNF and will be discharged home next week. Denies any issues with his PICC line. Denies any side effects with the IV abx. He says he saw Dr Reatha Armour last week and has been planned to get a repeat MRI c spine  on 10/21.   Denies any fever, chills, sweats. Has some discomfort in the neck but denies any weakness /numbness in the UE. In fact, he feels his strength in the RUE and RLE has been improving. He is able to walk. Has lost some weight since he was hospitalised and feels good about it.   No acute complaints today.  ROS: 11 point ROS negative except as stated above   Past Medical History:  Diagnosis Date  . Diabetes mellitus without complication ([place])   . Hypertension   . Pacemaker    Past Surgical History:  Procedure Laterality Date  . BACK SURGERY    . INSERT / REPLACE / REMOVE PACEMAKER  05/2019  . STOMACH SURGERY     Current Outpatient Medications on File Prior to Visit  Medication Sig Dispense Refill  . acetaminophen (TYLENOL) 325 MG tablet Take 2 tablets (650 mg total) by mouth every 6 (six) hours as needed for headache.    . albuterol (PROVENTIL) (2.5 MG/3ML) 0.083% nebulizer solution Take 3 mLs (2.5 mg total) by nebulization every 4 (four) hours as needed for wheezing or shortness of breath.    Marland Kitchen arformoterol (BROVANA) 15 MCG/2ML NEBU Take 2 mLs (15 mcg total) by nebulization 2 (two) times daily.    Marland Kitchen  aspirin EC 81 MG tablet Take 81 mg by mouth at bedtime.     Marland Kitchen atorvastatin (LIPITOR) 20 MG tablet Take 20 mg by mouth daily.     Marland Kitchen augmented betamethasone dipropionate (DIPROLENE-AF) 0.05 % cream Apply 1 application topically 2 (two) times daily as needed (to affected areas).     . bisacodyl (DULCOLAX) 10 MG suppository Place 1 suppository (10 mg total) rectally daily as needed for moderate constipation.    . carvedilol (COREG) 6.25 MG tablet Take 1 tablet (6.25 mg total) by mouth 2 (two) times daily with a meal. Hold for SBP<100 and or HR<60    . cefTRIAXone (ROCEPHIN) IVPB Inject 2 g into the vein daily. Indication:  Streptococcus intermedius bacteremia with epidural abscess First Dose: No Last Day of Therapy:  01/16/2020 Labs - Once weekly:  CBC/D and BMP, Labs - Every other week:  ESR and CRP Method of administration: IV Push 35 Units 0  . diclofenac Sodium (VOLTAREN) 1 % GEL Apply 2-4 g topically 4 (four) times daily as needed (to painful areas of shoulders and neck).    . diphenhydrAMINE (BENADRYL) 25 mg capsule Take 1 capsule (25 mg total) by mouth every 8 (eight) hours as needed for itching or sleep.  0  . fluticasone (FLONASE) 50 MCG/ACT nasal spray Place 2 sprays into both nostrils daily as needed for allergies or rhinitis.     . Fluticasone-Salmeterol (WIXELA INHUB) 250-50 MCG/DOSE AEPB Inhale 1 puff into the lungs in the morning.    . gabapentin (NEURONTIN) 100 MG capsule Take 1 capsule (100 mg total) by mouth 3 (three) times daily.    . hydrochlorothiazide (HYDRODIURIL) 25 MG tablet Take 1 tablet (25 mg total) by mouth in the morning.    Marland Kitchen ipratropium-albuterol (DUONEB) 0.5-2.5 (3) MG/3ML SOLN Take 3 mLs by nebulization 2 (two) times daily.    . melatonin 3 MG TABS tablet Take 1 tablet (3 mg total) by mouth at bedtime.  0  . metFORMIN (GLUCOPHAGE-XR) 500 MG 24 hr tablet Take 500 mg by mouth 2 (two) times daily after a meal.     . omeprazole (PRILOSEC) 20 MG capsule Take 20 mg by  mouth daily before breakfast.     .  polyethylene glycol (MIRALAX / GLYCOLAX) 17 g packet Take 17 g by mouth daily as needed for moderate constipation.    . Semaglutide,0.25 or 0.5MG/DOS, (OZEMPIC, 0.25 OR 0.5 MG/DOSE,) 2 MG/1.5ML SOPN Inject 0.5 mg into the skin every 7 (seven) days.     Marland Kitchen senna-docusate (SENOKOT-S) 8.6-50 MG tablet Take 2 tablets by mouth 2 (two) times daily.    . traMADol (ULTRAM) 50 MG tablet Take 1 tablet (50 mg total) by mouth once as needed for moderate pain or severe pain. 15 tablet 0  . urea (CARMOL) 40 % CREA Apply 1 application topically daily as needed (for itching/irritation- as directed).     . furosemide (LASIX) 40 MG tablet Take 1 tablet (40 mg total) by mouth daily for 5 days. 5 tablet 0  . [DISCONTINUED] amlodipine-benazepril (LOTREL) 2.5-10 MG capsule Take 1 capsule by mouth daily.     No current facility-administered medications on file prior to visit.   Allergies  Allergen Reactions  . Benazepril Swelling and Other (See Comments)    Lips only became swollen  . Ibuprofen Other (See Comments)    Was told by MD to not take this  . Mirabegron Other (See Comments) and Cough    Congestion and wheezing, also  . Amlodipine Swelling and Other (See Comments)    Leg edema   . Cephalexin Itching  . Molds & Smuts Other (See Comments)    "RUNNNING NOSE AND EYES"  . Oxycodone Itching  . Percodan [Oxycodone-Aspirin] Itching   Social History   Socioeconomic History  . Marital status: Married    Spouse name: Not on file  . Number of children: Not on file  . Years of education: Not on file  . Highest education level: Not on file  Occupational History  . Not on file  Tobacco Use  . Smoking status: Never Smoker  . Smokeless tobacco: Never Used  Vaping Use  . Vaping Use: Never used  Substance and Sexual Activity  . Alcohol use: No  . Drug use: Never  . Sexual activity: Not on file  Other Topics Concern  . Not on file  Social History Narrative  . Not on  file   Social Determinants of Health   Financial Resource Strain:   . Difficulty of Paying Living Expenses: Not on file  Food Insecurity:   . Worried About Charity fundraiser in the Last Year: Not on file  . Ran Out of Food in the Last Year: Not on file  Transportation Needs:   . Lack of Transportation (Medical): Not on file  . Lack of Transportation (Non-Medical): Not on file  Physical Activity:   . Days of Exercise per Week: Not on file  . Minutes of Exercise per Session: Not on file  Stress:   . Feeling of Stress : Not on file  Social Connections:   . Frequency of Communication with Friends and Family: Not on file  . Frequency of Social Gatherings with Friends and Family: Not on file  . Attends Religious Services: Not on file  . Active Member of Clubs or Organizations: Not on file  . Attends Archivist Meetings: Not on file  . Marital Status: Not on file  Intimate Partner Violence:   . Fear of Current or Ex-Partner: Not on file  . Emotionally Abused: Not on file  . Physically Abused: Not on file  . Sexually Abused: Not on file    Vitals BP 119/73   Pulse 70  SpO2 97%    Examination  General - not in acute distress, comfortably sitting in chair, MORBIDLY OBESE HEENT - PEERLA, no pallor and no icterus, ON NASAL CANNULA 2L Chest - b/l clear air entry, no additional sounds,  CVS- Normal s1s2, RRR Abdomen - Soft, Non tender , non distended, OBESE Ext- no pedal edema Neuro: 5/5 STRENGTH IN BOTH UE AND LE, SENSATION BILATERALLY INTACT Back - WNL Psych : calm and cooperative   Recent labs CBC Latest Ref Rng & Units 12/12/2019 12/11/2019 12/08/2019  WBC 4.0 - 10.5 K/uL 5.7 5.0 4.4  Hemoglobin 13.0 - 17.0 g/dL 10.1(L) 9.7(L) 9.0(L)  Hematocrit 39 - 52 % 33.1(L) 30.8(L) 29.5(L)  Platelets 150 - 400 K/uL 286 263 290   CMP Latest Ref Rng & Units 12/12/2019 12/11/2019 12/10/2019  Glucose 70 - 99 mg/dL 123(H) 121(H) -  BUN 8 - 23 mg/dL 13 8 -  Creatinine 0.61 -  1.24 mg/dL 0.97 0.97 -  Sodium 135 - 145 mmol/L 140 140 -  Potassium 3.5 - 5.1 mmol/L 3.8 3.4(L) 3.6  Chloride 98 - 111 mmol/L 98 96(L) -  CO2 22 - 32 mmol/L 34(H) 35(H) -  Calcium 8.9 - 10.3 mg/dL 9.2 9.1 -  Total Protein 6.5 - 8.1 g/dL - - -  Total Bilirubin 0.3 - 1.2 mg/dL - - -  Alkaline Phos 38 - 126 U/L - - -  AST 15 - 41 U/L - - -  ALT 0 - 44 U/L - - -    Pertinent Microbiology Results for orders placed or performed during the hospital encounter of 11/22/19  Blood culture (routine single)     Status: Abnormal   Collection Time: 11/22/19  3:07 PM   Specimen: BLOOD RIGHT HAND  Result Value Ref Range Status   Specimen Description BLOOD RIGHT HAND  Final   Special Requests   Final    BOTTLES DRAWN AEROBIC AND ANAEROBIC Blood Culture adequate volume   Culture  Setup Time   Final    GRAM POSITIVE COCCI IN BOTH AEROBIC AND ANAEROBIC BOTTLES CRITICAL VALUE NOTED.  VALUE IS CONSISTENT WITH PREVIOUSLY REPORTED AND CALLED VALUE.    Culture (A)  Final    STREPTOCOCCUS INTERMEDIUS SUSCEPTIBILITIES PERFORMED ON PREVIOUS CULTURE WITHIN THE LAST 5 DAYS. Performed at [facility], [place] [address] [place], [place] [address]    Report Status 11/25/2019 FINAL  Final  SARS Coronavirus 2 by RT PCR (hospital order, performed in [facility] hospital lab) Nasopharyngeal Nasopharyngeal Swab     Status: None   Collection Time: 11/22/19  3:36 PM   Specimen: Nasopharyngeal Swab  Result Value Ref Range Status   SARS Coronavirus 2 NEGATIVE NEGATIVE Final    Comment: (NOTE) SARS-CoV-2 target nucleic acids are NOT DETECTED.  The SARS-CoV-2 RNA is generally detectable in upper and lower respiratory specimens during the acute phase of infection. The lowest concentration of SARS-CoV-2 viral copies this assay can detect is 250 copies / mL. A negative result does not preclude SARS-CoV-2 infection and should not be used as the sole basis for treatment or other patient management decisions.  A  negative result may occur with improper specimen collection / handling, submission of specimen other than nasopharyngeal swab, presence of viral mutation(s) within the areas targeted by this assay, and inadequate number of viral copies (<250 copies / mL). A negative result must be combined with clinical observations, patient history, and epidemiological information.  Fact Sheet for Patients:   StrictlyIdeas.no  Fact Sheet  for Healthcare Providers: BankingDealers.co.za  This test is not yet approved or  cleared by the [place] and has been authorized for detection and/or diagnosis of SARS-CoV-2 by FDA under an Emergency Use Authorization (EUA).  This EUA will remain in effect (meaning this test can be used) for the duration of the COVID-19 declaration under Section 564(b)(1) of the Act, 21 U.S.C. section 360bbb-3(b)(1), unless the authorization is terminated or revoked sooner.  Performed at [facility], [place] [address] [place], [place] [address]   Blood culture (routine x 2)     Status: Abnormal   Collection Time: 11/22/19  4:35 PM   Specimen: BLOOD  Result Value Ref Range Status   Specimen Description BLOOD RIGHT ANTECUBITAL  Final   Special Requests   Final    BOTTLES DRAWN AEROBIC AND ANAEROBIC Blood Culture results may not be optimal due to an inadequate volume of blood received in culture bottles   Culture  Setup Time   Final    GRAM POSITIVE COCCI ANAEROBIC BOTTLE ONLY Organism ID to follow CRITICAL RESULT CALLED TO, READ BACK BY AND VERIFIED WITH: Hyman Bible PharmD 16:30 11/23/19 ([email or web address]) Performed at [facility], [place] [address], [place], [place] [address]    Culture STREPTOCOCCUS INTERMEDIUS (A)  Final   Report Status 11/25/2019 FINAL  Final   Organism ID, Bacteria STREPTOCOCCUS INTERMEDIUS  Final      Susceptibility   Streptococcus intermedius - MIC*    PENICILLIN <=0.06 SENSITIVE Sensitive      CEFTRIAXONE <=0.12 SENSITIVE Sensitive     ERYTHROMYCIN >=8 RESISTANT Resistant     LEVOFLOXACIN 0.5 SENSITIVE Sensitive     VANCOMYCIN 0.5 SENSITIVE Sensitive     * STREPTOCOCCUS INTERMEDIUS  Blood Culture ID Panel (Reflexed)     Status: Abnormal   Collection Time: 11/22/19  4:35 PM  Result Value Ref Range Status   Enterococcus faecalis NOT DETECTED NOT DETECTED Final   Enterococcus Faecium NOT DETECTED NOT DETECTED Final   Listeria monocytogenes NOT DETECTED NOT DETECTED Final   Staphylococcus species NOT DETECTED NOT DETECTED Final   Staphylococcus aureus (BCID) NOT DETECTED NOT DETECTED Final   Staphylococcus epidermidis NOT DETECTED NOT DETECTED Final   Staphylococcus lugdunensis NOT DETECTED NOT DETECTED Final   Streptococcus species DETECTED (A) NOT DETECTED Final    Comment: Not Enterococcus species, Streptococcus agalactiae, Streptococcus pyogenes, or Streptococcus pneumoniae. CRITICAL RESULT CALLED TO, READ BACK BY AND VERIFIED WITH: Hyman Bible PharmD 16:30 11/23/19 ([email or web address])    Streptococcus agalactiae NOT DETECTED NOT DETECTED Final   Streptococcus pneumoniae NOT DETECTED NOT DETECTED Final   Streptococcus pyogenes NOT DETECTED NOT DETECTED Final   A.calcoaceticus-baumannii NOT DETECTED NOT DETECTED Final   Bacteroides fragilis NOT DETECTED NOT DETECTED Final   Enterobacterales NOT DETECTED NOT DETECTED Final   Enterobacter cloacae complex NOT DETECTED NOT DETECTED Final   Escherichia coli NOT DETECTED NOT DETECTED Final   Klebsiella aerogenes NOT DETECTED NOT DETECTED Final   Klebsiella oxytoca NOT DETECTED NOT DETECTED Final   Klebsiella pneumoniae NOT DETECTED NOT DETECTED Final   Proteus species NOT DETECTED NOT DETECTED Final   Salmonella species NOT DETECTED NOT DETECTED Final   Serratia marcescens NOT DETECTED NOT DETECTED Final   Haemophilus influenzae NOT DETECTED NOT DETECTED Final   Neisseria meningitidis NOT DETECTED NOT DETECTED Final   Pseudomonas  aeruginosa NOT DETECTED NOT DETECTED Final   Stenotrophomonas maltophilia NOT DETECTED NOT DETECTED Final   Candida albicans NOT DETECTED NOT DETECTED Final  Candida auris NOT DETECTED NOT DETECTED Final   Candida glabrata NOT DETECTED NOT DETECTED Final   Candida krusei NOT DETECTED NOT DETECTED Final   Candida parapsilosis NOT DETECTED NOT DETECTED Final   Candida tropicalis NOT DETECTED NOT DETECTED Final   Cryptococcus neoformans/gattii NOT DETECTED NOT DETECTED Final    Comment: Performed at [facility], [place] [address], [place], [place] [address]  Urine culture     Status: Abnormal   Collection Time: 11/22/19  5:17 PM   Specimen: In/Out Cath Urine  Result Value Ref Range Status   Specimen Description IN/OUT CATH URINE  Final   Special Requests   Final    NONE Performed at [facility], [place] [address] [place], [place] [address]    Culture MULTIPLE SPECIES PRESENT, SUGGEST RECOLLECTION (A)  Final   Report Status 11/23/2019 FINAL  Final  MRSA PCR Screening     Status: None   Collection Time: 11/23/19  7:50 PM   Specimen: Nasal Mucosa; Nasopharyngeal  Result Value Ref Range Status   MRSA by PCR NEGATIVE NEGATIVE Final    Comment:        The GeneXpert MRSA Assay (FDA approved for NASAL specimens only), is one component of a comprehensive MRSA colonization surveillance program. It is not intended to diagnose MRSA infection nor to guide or monitor treatment for MRSA infections. Performed at [facility], [place] [address], [place], [place] [address]   Culture, blood (routine x 2)     Status: None   Collection Time: 11/24/19  4:12 PM   Specimen: BLOOD  Result Value Ref Range Status   Specimen Description BLOOD LEFT ANTECUBITAL  Final   Special Requests   Final    BOTTLES DRAWN AEROBIC AND ANAEROBIC Blood Culture adequate volume   Culture   Final    NO GROWTH 5 DAYS Performed at [facility], [place] [address] [place], [place] [address]    Report  Status 11/29/2019 FINAL  Final  Culture, blood (routine x 2)     Status: None   Collection Time: 11/24/19  4:14 PM   Specimen: BLOOD  Result Value Ref Range Status   Specimen Description BLOOD RIGHT ANTECUBITAL  Final   Special Requests   Final    BOTTLES DRAWN AEROBIC AND ANAEROBIC Blood Culture adequate volume   Culture   Final    NO GROWTH 5 DAYS Performed at [facility], [place] [address], [place], [place] [address]    Report Status 11/29/2019 FINAL  Final  SARS Coronavirus 2 by RT PCR (hospital order, performed in [facility] hospital lab) Nasopharyngeal Nasopharyngeal Swab     Status: None   Collection Time: 12/12/19  2:22 PM   Specimen: Nasopharyngeal Swab  Result Value Ref Range Status   SARS Coronavirus 2 NEGATIVE NEGATIVE Final    Comment: (NOTE) SARS-CoV-2 target nucleic acids are NOT DETECTED.  The SARS-CoV-2 RNA is generally detectable in upper and lower respiratory specimens during the acute phase of infection. The lowest concentration of SARS-CoV-2 viral copies this assay can detect is 250 copies / mL. A negative result does not preclude SARS-CoV-2 infection and should not be used as the sole basis for treatment or other patient management decisions.  A negative result may occur with improper specimen collection / handling, submission of specimen other than nasopharyngeal swab, presence of viral mutation(s) within the areas targeted by this assay, and inadequate number of viral copies (<250 copies / mL).  A negative result must be combined with clinical observations, patient history, and epidemiological information.  Fact Sheet for Patients:   StrictlyIdeas.no  Fact Sheet for Healthcare Providers: BankingDealers.co.za  This test is not yet approved or  cleared by the [place] FDA and has been authorized for detection and/or diagnosis of SARS-CoV-2 by FDA under an Emergency Use Authorization (EUA).  This EUA  will remain in effect (meaning this test can be used) for the duration of the COVID-19 declaration under Section 564(b)(1) of the Act, 21 U.S.C. section 360bbb-3(b)(1), unless the authorization is terminated or revoked sooner.  Performed at [facility], [place] [address], [place], [place] [address]     Pertinent Imaging MRI Cervical spine WWO contrast 12/13/19  FINDINGS: Alignment: Stable degenerative cervical spondylosis. Mild posterior subluxation of C4 and C5 appears stable.  Vertebrae: Stable changes of diskitis and osteomyelitis at C3-4 and C4-5. There has been further disc space narrowing at C3-4 suggesting further destructive changes of the disc.  Cord: Normal cord signal intensity. Stable mass effect on the cord by the epidural abscess.  Posterior Fossa, vertebral arteries, paraspinal tissues: Again demonstrated is a long epidural abscess extending from the midportion of C2 down to the bottom of C5. This measures approximately 6.5 cm in length and is approximately 1 cm wide. This previously measured 8.9 cm in length and was 1 cm wide. There is also persistent marked inflammatory phlegmon in the prevertebral/retropharyngeal space without discrete rim enhancing abscess.  Disc levels:  C2-3: No disc protrusions or significant foraminal disease. No foraminal stenosis. Stable appearing small epidural abscess at this level. Mild flattening of the thecal sac and obliteration of the anterior CSF space.  C3-4: Again changes of diskitis and osteomyelitis with further destruction of the disc space. The epidural abscess persists but is not is thick with slight decrease and mass effect on the anterior cord.  C4-5: Stable changes of diskitis and osteomyelitis. Small right paracentral epidural abscess appears relatively slightly smaller the AP diameter is approximately 3.5 mm and was previously 4.3 mm.  C5-6: Stable shallow central disc protrusion and uncinate  spurring changes with mass effect on the ventral thecal sac and bilateral foraminal stenosis. The epidural abscess and just above the disc space.  C6-7: Annular bulge and mild facet disease. Slight flattening of the ventral thecal sac and mild foraminal narrowing bilaterally.  C7-T1: No significant findings.  IMPRESSION: 1. 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 There has been further disc space narrowing at C3-4 suggesting further destructive changes of the disc. 2. Persistent but slightly smaller epidural abscess extending from the midportion of C2 down to the bottom of C5. 3. Persistent marked inflammatory phlegmon in the prevertebral/retropharyngeal space without discrete rim enhancing abscess. 4. Stable degenerative disc disease at C5-6 and C6-7 as above.   All pertinent labs/Imagings/notes reviewed. All pertinent plain films and CT images have been personally visualized and interpreted; radiology reports have been reviewed. Decision making incorporated into the Impression / Recommendations.

## 2019-12-31 LAB — SEDIMENTATION RATE: Sed Rate: 48 mm/h — ABNORMAL HIGH (ref 0–20)

## 2019-12-31 LAB — C-REACTIVE PROTEIN: CRP: 22.5 mg/L — ABNORMAL HIGH (ref <8.0)

## 2019-12-31 NOTE — Progress Notes (Signed)
CRP higher than a month ago. Continue current antibiotics.Will trend ESR and CRP.

## 2020-01-02 ENCOUNTER — Telehealth: Payer: Self-pay

## 2020-01-02 NOTE — Telephone Encounter (Addendum)
Attempted to contact [facility] again today, 01/02/2020, was left on hold for around 10 minutes and then the line went busy and the call was disconnected. Will attempt to contact again later today and will provide an update then. Crissie Figures   ----- Message from Odette Fraction, MD sent at 12/30/2019 11:13 AM EDT ----- Regarding: Labs I have not been recieveing his labs. Would like to have them.

## 2020-01-04 ENCOUNTER — Telehealth: Payer: Self-pay | Admitting: *Deleted

## 2020-01-04 NOTE — Telephone Encounter (Signed)
Thanks

## 2020-01-04 NOTE — Telephone Encounter (Signed)
Attempted to contact [facility] by phone multiple times with no response. Was able to acquire fax number and am faxing over patient snapshot and cover sheet requesting labs be faxed back to [facility]. [facility] fax number is [phone]. Kevin [name]

## 2020-01-04 NOTE — Telephone Encounter (Signed)
Dayton Bailiff, MD; Andree Coss, RN; Granville Lewis Phone Number: [phone]  Hi Dr. Elinor Parkinson and Marcelino Duster.  I just wanted to connect to let you know we received a call today from the CSW at [facility] that Mr. Johannsen will DC back to his home on Thursday, 10/7. Our [facility] team will manage his IV Ceftriaxone at home going forward. Please let us know if you need anything further.   Pam~

## 2020-01-05 NOTE — Telephone Encounter (Signed)
Per Dr. Elinor Parkinson called [facility] with orders to hold off on removing picc until MRI results have returned. Home health will maintain picc until removal orders are called in.  Spoke with Jorja Loa who will update nursing. Lorenso Courier, [place]

## 2020-01-05 NOTE — Telephone Encounter (Signed)
Mary at [facility] called to confirm end date and lab orders (not included on discharge paperwork from [facility]).  Per Dr Levonne Spiller note 10/1, patient will continue IV antibiotics until she sees him 10/22.  Per Dr Linus Salmons, patient labs will be weekly CBC, CMP; ESR, CRP every 2 weeks. Orders repeated and verified. Landis Gandy, RN

## 2020-01-09 ENCOUNTER — Encounter: Payer: Self-pay | Admitting: Infectious Diseases

## 2020-01-19 ENCOUNTER — Other Ambulatory Visit: Payer: Self-pay

## 2020-01-19 ENCOUNTER — Ambulatory Visit (HOSPITAL_COMMUNITY)
Admission: RE | Admit: 2020-01-19 | Discharge: 2020-01-19 | Disposition: A | Discharge status: Home / Self Care | Payer: Medicare Other | Source: Ambulatory Visit | Attending: Neurological Surgery | Admitting: Neurological Surgery

## 2020-01-19 DIAGNOSIS — G062 Extradural and subdural abscess, unspecified: Secondary | ICD-10-CM

## 2020-01-19 MED ORDER — GADOBUTROL 1 MMOL/ML IV SOLN
10.0000 mL | Freq: Once | INTRAVENOUS | Status: AC | PRN
  Administered 2020-01-19: 10 mL via INTRAVENOUS

## 2020-01-20 ENCOUNTER — Ambulatory Visit (INDEPENDENT_AMBULATORY_CARE_PROVIDER_SITE_OTHER): Payer: Medicare Other | Admitting: Infectious Diseases

## 2020-01-20 ENCOUNTER — Encounter: Payer: Self-pay | Admitting: Infectious Diseases

## 2020-01-20 ENCOUNTER — Inpatient Hospital Stay: Payer: Managed Care, Other (non HMO) | Admitting: Infectious Diseases

## 2020-01-20 ENCOUNTER — Telehealth: Payer: Self-pay

## 2020-01-20 VITALS — BP 122/73 | HR 80 | Temp 97.9°F

## 2020-01-20 DIAGNOSIS — G061 Intraspinal abscess and granuloma: Secondary | ICD-10-CM | POA: Diagnosis not present

## 2020-01-20 NOTE — Telephone Encounter (Signed)
Per Md called advance to extend IV antbx x3 weeks with weekly labs. Relayed orders to Amy who will update home health. Will need weekly labs faxed to office. Patient will need to schedule follow up with Neuro surgery.

## 2020-01-20 NOTE — Progress Notes (Signed)
[facility]                                                             [place], [place], [place], [address]                                                                  Phn. [phone]; Fax: [phone]                                                                             Date: 01/20/2020  Reason for Referral: Fu for epidural abscess   Assessment 1. Cervical epidural abscess ( extending from midportion of C2 to C5)     Phlegmon in the prevertebral/retropharyngeal abscess without abscess - Significantly improved from prior MRI, down from 6.4*0.9cm --> 1.3*0.7cm   2. C3-C4 and C4-C5 Discitis and Osteomyelitis 3. Strep Intermedius Bacteremia 4. Medication monitoiring  Plan The cervical abscess although has significantly decreased in size, still present. Hence, I will extend IV ceftriaxone as is for another 3 weeks and hopefully this will resolve the residual abscess.  Will monitor weekly CBC, CMP, ESR and CRP Follow up in 3 weeks  Discussed about follow up with NeuroSurgery  All questions and concerns were discussed and addressed  I spent greater than 25 minutes with the patient including greater than 50% of time in face to face counsel of the patient and in coordination of their care.   Rosiland Oz, [facility]  Office phone [phone] Fax no. [phone] ______________________________________________________________________________________________________________________ Subjective Patient is here for follow of cervical epidural abscess. Still getting IV ceftriaxone through RT arm PICC line. Denies any side effects with the IV antibiotics. Denies issues with the PICC line. Had a MRI c spine yesterday which showed the cervical epidural abscess has decreased  significantly from 6.4*0.9cm to 1.3*0.7cm. Neck pain has significantly got better, the numbness in the Left hand has improved. Says the numbness in the left pinky finger has improved although the tip still feels numb. He is able to walk with the help of walker. He is also working with PT. Overall feels well and improving, denies any complaints,.  ROS: 11 point ROS negative except as stated above   Past Medical History:  Diagnosis Date  . Diabetes mellitus without complication ([place])   . Hypertension   . Pacemaker  Past Surgical History:  Procedure Laterality Date  . BACK SURGERY    . INSERT / REPLACE / REMOVE PACEMAKER  05/2019  . STOMACH SURGERY     Current Outpatient Medications on File Prior to Visit  Medication Sig Dispense Refill  . acetaminophen (TYLENOL) 325 MG tablet Take 2 tablets (650 mg total) by mouth every 6 (six) hours as needed for headache.    . albuterol (PROVENTIL) (2.5 MG/3ML) 0.083% nebulizer solution Take 3 mLs (2.5 mg total) by nebulization every 4 (four) hours as needed for wheezing or shortness of breath.    Marland Kitchen arformoterol (BROVANA) 15 MCG/2ML NEBU Take 2 mLs (15 mcg total) by nebulization 2 (two) times daily.    Marland Kitchen aspirin EC 81 MG tablet Take 81 mg by mouth at bedtime.     Marland Kitchen atorvastatin (LIPITOR) 20 MG tablet Take 20 mg by mouth daily.     Marland Kitchen augmented betamethasone dipropionate (DIPROLENE-AF) 0.05 % cream Apply 1 application topically 2 (two) times daily as needed (to affected areas).     . bisacodyl (DULCOLAX) 10 MG suppository Place 1 suppository (10 mg total) rectally daily as needed for moderate constipation.    . carvedilol (COREG) 6.25 MG tablet Take 1 tablet (6.25 mg total) by mouth 2 (two) times daily with a meal. Hold for SBP<100 and or HR<60    . cefTRIAXone (ROCEPHIN) IVPB Inject 2 g into the vein daily. Indication:  Streptococcus intermedius bacteremia with epidural abscess First Dose: No Last Day of Therapy:  01/16/2020 Labs - Once weekly:  CBC/D  and BMP, Labs - Every other week:  ESR and CRP Method of administration: IV Push 35 Units 0  . diclofenac Sodium (VOLTAREN) 1 % GEL Apply 2-4 g topically 4 (four) times daily as needed (to painful areas of shoulders and neck).    . diphenhydrAMINE (BENADRYL) 25 mg capsule Take 1 capsule (25 mg total) by mouth every 8 (eight) hours as needed for itching or sleep.  0  . fluticasone (FLONASE) 50 MCG/ACT nasal spray Place 2 sprays into both nostrils daily as needed for allergies or rhinitis.     . Fluticasone-Salmeterol (WIXELA INHUB) 250-50 MCG/DOSE AEPB Inhale 1 puff into the lungs in the morning.    . gabapentin (NEURONTIN) 100 MG capsule Take 1 capsule (100 mg total) by mouth 3 (three) times daily.    . hydrochlorothiazide (HYDRODIURIL) 25 MG tablet Take 1 tablet (25 mg total) by mouth in the morning.    Marland Kitchen ipratropium-albuterol (DUONEB) 0.5-2.5 (3) MG/3ML SOLN Take 3 mLs by nebulization 2 (two) times daily.    . melatonin 3 MG TABS tablet Take 1 tablet (3 mg total) by mouth at bedtime.  0  . metFORMIN (GLUCOPHAGE-XR) 500 MG 24 hr tablet Take 500 mg by mouth 2 (two) times daily after a meal.     . omeprazole (PRILOSEC) 20 MG capsule Take 20 mg by mouth daily before breakfast.     . polyethylene glycol (MIRALAX / GLYCOLAX) 17 g packet Take 17 g by mouth daily as needed for moderate constipation.    . Semaglutide,0.25 or 0.5MG/DOS, (OZEMPIC, 0.25 OR 0.5 MG/DOSE,) 2 MG/1.5ML SOPN Inject 0.5 mg into the skin every 7 (seven) days.     Marland Kitchen senna-docusate (SENOKOT-S) 8.6-50 MG tablet Take 2 tablets by mouth 2 (two) times daily.    . traMADol (ULTRAM) 50 MG tablet Take 1 tablet (50 mg total) by mouth once as needed for moderate pain or severe pain. 15 tablet 0  .  urea (CARMOL) 40 % CREA Apply 1 application topically daily as needed (for itching/irritation- as directed).     . furosemide (LASIX) 40 MG tablet Take 1 tablet (40 mg total) by mouth daily for 5 days. 5 tablet 0  . [DISCONTINUED]  amlodipine-benazepril (LOTREL) 2.5-10 MG capsule Take 1 capsule by mouth daily.     No current facility-administered medications on file prior to visit.   Allergies  Allergen Reactions  . Benazepril Swelling and Other (See Comments)    Lips only became swollen  . Ibuprofen Other (See Comments)    Was told by MD to not take this  . Mirabegron Other (See Comments) and Cough    Congestion and wheezing, also  . Amlodipine Swelling and Other (See Comments)    Leg edema   . Cephalexin Itching  . Molds & Smuts Other (See Comments)    "RUNNNING NOSE AND EYES"  . Oxycodone Itching  . Percodan [Oxycodone-Aspirin] Itching   Social History   Socioeconomic History  . Marital status: Married    Spouse name: Not on file  . Number of children: Not on file  . Years of education: Not on file  . Highest education level: Not on file  Occupational History  . Not on file  Tobacco Use  . Smoking status: Never Smoker  . Smokeless tobacco: Never Used  Vaping Use  . Vaping Use: Never used  Substance and Sexual Activity  . Alcohol use: No  . Drug use: Never  . Sexual activity: Not on file  Other Topics Concern  . Not on file  Social History Narrative  . Not on file   Social Determinants of Health   Financial Resource Strain:   . Difficulty of Paying Living Expenses: Not on file  Food Insecurity:   . Worried About Charity fundraiser in the Last Year: Not on file  . Ran Out of Food in the Last Year: Not on file  Transportation Needs:   . Lack of Transportation (Medical): Not on file  . Lack of Transportation (Non-Medical): Not on file  Physical Activity:   . Days of Exercise per Week: Not on file  . Minutes of Exercise per Session: Not on file  Stress:   . Feeling of Stress : Not on file  Social Connections:   . Frequency of Communication with Friends and Family: Not on file  . Frequency of Social Gatherings with Friends and Family: Not on file  . Attends Religious Services: Not on  file  . Active Member of Clubs or Organizations: Not on file  . Attends Archivist Meetings: Not on file  . Marital Status: Not on file  Intimate Partner Violence:   . Fear of Current or Ex-Partner: Not on file  . Emotionally Abused: Not on file  . Physically Abused: Not on file  . Sexually Abused: Not on file    Vitals BP 122/73   Pulse 80   Temp 97.9 F (36.6 C) (Oral)    Examination  General - not in acute distress, comfortably sitting in chair, MORBIDLY OBESE HEENT - PEERLA, no pallor and no icterus, ON NASAL CANNULA 2L Chest - b/l clear air entry, no additional sounds,  CVS- Normal s1s2, RRR Abdomen - Soft, Non tender , non distended, OBESE Ext- no pedal edema Neuro: 5/5 STRENGTH IN BOTH UE AND LE, SENSATION BILATERALLY INTACT Back - WNL Psych : calm and cooperative   Recent labs CBC Latest Ref Rng & Units 12/12/2019 12/11/2019  12/08/2019  WBC 4.0 - 10.5 K/uL 5.7 5.0 4.4  Hemoglobin 13.0 - 17.0 g/dL 10.1(L) 9.7(L) 9.0(L)  Hematocrit 39 - 52 % 33.1(L) 30.8(L) 29.5(L)  Platelets 150 - 400 K/uL 286 263 290   CMP Latest Ref Rng & Units 12/12/2019 12/11/2019 12/10/2019  Glucose 70 - 99 mg/dL 123(H) 121(H) -  BUN 8 - 23 mg/dL 13 8 -  Creatinine 0.61 - 1.24 mg/dL 0.97 0.97 -  Sodium 135 - 145 mmol/L 140 140 -  Potassium 3.5 - 5.1 mmol/L 3.8 3.4(L) 3.6  Chloride 98 - 111 mmol/L 98 96(L) -  CO2 22 - 32 mmol/L 34(H) 35(H) -  Calcium 8.9 - 10.3 mg/dL 9.2 9.1 -  Total Protein 6.5 - 8.1 g/dL - - -  Total Bilirubin 0.3 - 1.2 mg/dL - - -  Alkaline Phos 38 - 126 U/L - - -  AST 15 - 41 U/L - - -  ALT 0 - 44 U/L - - -    Pertinent Microbiology Results for orders placed or performed during the hospital encounter of 11/22/19  Blood culture (routine single)     Status: Abnormal   Collection Time: 11/22/19  3:07 PM   Specimen: BLOOD RIGHT HAND  Result Value Ref Range Status   Specimen Description BLOOD RIGHT HAND  Final   Special Requests   Final    BOTTLES DRAWN  AEROBIC AND ANAEROBIC Blood Culture adequate volume   Culture  Setup Time   Final    GRAM POSITIVE COCCI IN BOTH AEROBIC AND ANAEROBIC BOTTLES CRITICAL VALUE NOTED.  VALUE IS CONSISTENT WITH PREVIOUSLY REPORTED AND CALLED VALUE.    Culture (A)  Final    STREPTOCOCCUS INTERMEDIUS SUSCEPTIBILITIES PERFORMED ON PREVIOUS CULTURE WITHIN THE LAST 5 DAYS. Performed at [facility], [place] [address] [place], [place] [address]    Report Status 11/25/2019 FINAL  Final  SARS Coronavirus 2 by RT PCR (hospital order, performed in [facility] hospital lab) Nasopharyngeal Nasopharyngeal Swab     Status: None   Collection Time: 11/22/19  3:36 PM   Specimen: Nasopharyngeal Swab  Result Value Ref Range Status   SARS Coronavirus 2 NEGATIVE NEGATIVE Final    Comment: (NOTE) SARS-CoV-2 target nucleic acids are NOT DETECTED.  The SARS-CoV-2 RNA is generally detectable in upper and lower respiratory specimens during the acute phase of infection. The lowest concentration of SARS-CoV-2 viral copies this assay can detect is 250 copies / mL. A negative result does not preclude SARS-CoV-2 infection and should not be used as the sole basis for treatment or other patient management decisions.  A negative result may occur with improper specimen collection / handling, submission of specimen other than nasopharyngeal swab, presence of viral mutation(s) within the areas targeted by this assay, and inadequate number of viral copies (<250 copies / mL). A negative result must be combined with clinical observations, patient history, and epidemiological information.  Fact Sheet for Patients:   StrictlyIdeas.no  Fact Sheet for Healthcare Providers: BankingDealers.co.za  This test is not yet approved or  cleared by the [place] FDA and has been authorized for detection and/or diagnosis of SARS-CoV-2 by FDA under an Emergency Use Authorization (EUA).  This EUA  will remain in effect (meaning this test can be used) for the duration of the COVID-19 declaration under Section 564(b)(1) of the Act, 21 U.S.C. section 360bbb-3(b)(1), unless the authorization is terminated or revoked sooner.  Performed at [facility], [place] [address] [place], [place] [address]  Blood culture (routine x 2)     Status: Abnormal   Collection Time: 11/22/19  4:35 PM   Specimen: BLOOD  Result Value Ref Range Status   Specimen Description BLOOD RIGHT ANTECUBITAL  Final   Special Requests   Final    BOTTLES DRAWN AEROBIC AND ANAEROBIC Blood Culture results may not be optimal due to an inadequate volume of blood received in culture bottles   Culture  Setup Time   Final    GRAM POSITIVE COCCI ANAEROBIC BOTTLE ONLY Organism ID to follow CRITICAL RESULT CALLED TO, READ BACK BY AND VERIFIED WITH: Hyman Bible PharmD 16:30 11/23/19 ([email or web address]) Performed at [facility], [address] [place], [place] [address]    Culture STREPTOCOCCUS INTERMEDIUS (A)  Final   Report Status 11/25/2019 FINAL  Final   Organism ID, Bacteria STREPTOCOCCUS INTERMEDIUS  Final      Susceptibility   Streptococcus intermedius - MIC*    PENICILLIN <=0.06 SENSITIVE Sensitive     CEFTRIAXONE <=0.12 SENSITIVE Sensitive     ERYTHROMYCIN >=8 RESISTANT Resistant     LEVOFLOXACIN 0.5 SENSITIVE Sensitive     VANCOMYCIN 0.5 SENSITIVE Sensitive     * STREPTOCOCCUS INTERMEDIUS  Blood Culture ID Panel (Reflexed)     Status: Abnormal   Collection Time: 11/22/19  4:35 PM  Result Value Ref Range Status   Enterococcus faecalis NOT DETECTED NOT DETECTED Final   Enterococcus Faecium NOT DETECTED NOT DETECTED Final   Listeria monocytogenes NOT DETECTED NOT DETECTED Final   Staphylococcus species NOT DETECTED NOT DETECTED Final   Staphylococcus aureus (BCID) NOT DETECTED NOT DETECTED Final   Staphylococcus epidermidis NOT DETECTED NOT DETECTED Final   Staphylococcus lugdunensis NOT DETECTED NOT DETECTED  Final   Streptococcus species DETECTED (A) NOT DETECTED Final    Comment: Not Enterococcus species, Streptococcus agalactiae, Streptococcus pyogenes, or Streptococcus pneumoniae. CRITICAL RESULT CALLED TO, READ BACK BY AND VERIFIED WITH: Hyman Bible PharmD 16:30 11/23/19 ([email or web address])    Streptococcus agalactiae NOT DETECTED NOT DETECTED Final   Streptococcus pneumoniae NOT DETECTED NOT DETECTED Final   Streptococcus pyogenes NOT DETECTED NOT DETECTED Final   A.calcoaceticus-baumannii NOT DETECTED NOT DETECTED Final   Bacteroides fragilis NOT DETECTED NOT DETECTED Final   Enterobacterales NOT DETECTED NOT DETECTED Final   Enterobacter cloacae complex NOT DETECTED NOT DETECTED Final   Escherichia coli NOT DETECTED NOT DETECTED Final   Klebsiella aerogenes NOT DETECTED NOT DETECTED Final   Klebsiella oxytoca NOT DETECTED NOT DETECTED Final   Klebsiella pneumoniae NOT DETECTED NOT DETECTED Final   Proteus species NOT DETECTED NOT DETECTED Final   Salmonella species NOT DETECTED NOT DETECTED Final   Serratia marcescens NOT DETECTED NOT DETECTED Final   Haemophilus influenzae NOT DETECTED NOT DETECTED Final   Neisseria meningitidis NOT DETECTED NOT DETECTED Final   Pseudomonas aeruginosa NOT DETECTED NOT DETECTED Final   Stenotrophomonas maltophilia NOT DETECTED NOT DETECTED Final   Candida albicans NOT DETECTED NOT DETECTED Final   Candida auris NOT DETECTED NOT DETECTED Final   Candida glabrata NOT DETECTED NOT DETECTED Final   Candida krusei NOT DETECTED NOT DETECTED Final   Candida parapsilosis NOT DETECTED NOT DETECTED Final   Candida tropicalis NOT DETECTED NOT DETECTED Final   Cryptococcus neoformans/gattii NOT DETECTED NOT DETECTED Final    Comment: Performed at [facility], [address] [place], [place] [address]  Urine culture     Status: Abnormal   Collection Time: 11/22/19  5:17 PM   Specimen: In/Out Cath  Urine  Result Value Ref Range Status   Specimen Description IN/OUT  CATH URINE  Final   Special Requests   Final    NONE Performed at [name], [address], [place], [place] [address]    Culture MULTIPLE SPECIES PRESENT, SUGGEST RECOLLECTION (A)  Final   Report Status 11/23/2019 FINAL  Final  MRSA PCR Screening     Status: None   Collection Time: 11/23/19  7:50 PM   Specimen: Nasal Mucosa; Nasopharyngeal  Result Value Ref Range Status   MRSA by PCR NEGATIVE NEGATIVE Final    Comment:        The GeneXpert MRSA Assay (FDA approved for NASAL specimens only), is one component of a comprehensive MRSA colonization surveillance program. It is not intended to diagnose MRSA infection nor to guide or monitor treatment for MRSA infections. Performed at [facility], [place] [address], [place], [place] [address]   Culture, blood (routine x 2)     Status: None   Collection Time: 11/24/19  4:12 PM   Specimen: BLOOD  Result Value Ref Range Status   Specimen Description BLOOD LEFT ANTECUBITAL  Final   Special Requests   Final    BOTTLES DRAWN AEROBIC AND ANAEROBIC Blood Culture adequate volume   Culture   Final    NO GROWTH 5 DAYS Performed at [facility], [place] [address] [place], [place] [address]    Report Status 11/29/2019 FINAL  Final  Culture, blood (routine x 2)     Status: None   Collection Time: 11/24/19  4:14 PM   Specimen: BLOOD  Result Value Ref Range Status   Specimen Description BLOOD RIGHT ANTECUBITAL  Final   Special Requests   Final    BOTTLES DRAWN AEROBIC AND ANAEROBIC Blood Culture adequate volume   Culture   Final    NO GROWTH 5 DAYS Performed at [facility], [place] [address] [place], [place] [address]    Report Status 11/29/2019 FINAL  Final  SARS Coronavirus 2 by RT PCR (hospital order, performed in [facility] hospital lab) Nasopharyngeal Nasopharyngeal Swab     Status: None   Collection Time: 12/12/19  2:22 PM   Specimen: Nasopharyngeal Swab  Result Value Ref Range Status   SARS Coronavirus 2  NEGATIVE NEGATIVE Final    Comment: (NOTE) SARS-CoV-2 target nucleic acids are NOT DETECTED.  The SARS-CoV-2 RNA is generally detectable in upper and lower respiratory specimens during the acute phase of infection. The lowest concentration of SARS-CoV-2 viral copies this assay can detect is 250 copies / mL. A negative result does not preclude SARS-CoV-2 infection and should not be used as the sole basis for treatment or other patient management decisions.  A negative result may occur with improper specimen collection / handling, submission of specimen other than nasopharyngeal swab, presence of viral mutation(s) within the areas targeted by this assay, and inadequate number of viral copies (<250 copies / mL). A negative result must be combined with clinical observations, patient history, and epidemiological information.  Fact Sheet for Patients:   StrictlyIdeas.no  Fact Sheet for Healthcare Providers: BankingDealers.co.za  This test is not yet approved or  cleared by the [place] FDA and has been authorized for detection and/or diagnosis of SARS-CoV-2 by FDA under an Emergency Use Authorization (EUA).  This EUA will remain in effect (meaning this test can be used) for the duration of the COVID-19 declaration under Section 564(b)(1) of the Act, 21 U.S.C.  section 360bbb-3(b)(1), unless the authorization is terminated or revoked sooner.  Performed at [facility], [place] [address] [place], [place] [address]     Pertinent Imaging MRI Cervical spine WWO Contrast 01/19/20 FINDINGS: Alignment: Physiologic.  Vertebrae: Interval improvement of the marrow edema at C2-3, C4 and C5 with minimal residual edema. Further progression of C3-4 disc space height loss with erosion of the corresponding endplates.  Cord: Flattening of the anterior cord contour at C3-4 through C6-7, more pronounced at C3-4, without cord signal  abnormality.  Posterior Fossa, vertebral arteries, paraspinal tissues: Edema and contrast enhancement of prevertebral musculature from C2 through C5 with decreased thickness when compared to prior.  Anterior epidural abscess and phlegmon appear decreased from prior, extending from the C2-3 disc space to C6. The fluid collection portion measures approximately 1.3 x 0.7 cm, compared to 6.4 x 0.9 cm on prior.  Disc levels:  C2-3: No spinal canal and neural foraminal stenosis.  C3-4: Left paracentral epidural abscess resulting in moderate spinal canal stenosis and causing flattening of the anterior cord contour and severe left neural foraminal stenosis.  C4-5: Small posterior disc protrusion and anterior epidural phlegmon resulting in mild spinal canal stenosis and causing mild flattening of the anterior cord. Uncovertebral and facet degenerative changes resulting in severe right and moderate left neural foraminal narrowing.  C5-6: Small posterior disc protrusion and anterior epidural phlegmon resulting in mild spinal canal stenosis and causing mild flattening of the anterior cord. Uncovertebral and facet degenerative changes resulting in severe bilateral neural foraminal narrowing.  C6-7: Left paracentral disc protrusion with minimal flattening of the anterior cord, without significant spinal canal stenosis. Uncovertebral and facet degenerative changes resulting in moderate bilateral neural foraminal narrowing.  C7-T1: No spinal canal or neural foraminal stenosis.  IMPRESSION: 1. Interval improvement in prevertebral phlegmon and anterior epidural phlegmon/abscess extending from the C2-3 disc space to C6. Decreased mass effect on the anterior cord. 2. Further progression of C3-4 disc space height loss with erosion of the corresponding endplates. 3. Moderate spinal canal stenosis at C3-4, mild spinal canal stenosis at C4-5 and C5-6. 4. Multilevel neural foraminal  stenosis, severe on the left at C3-4 and bilaterally at C5-6.   MRI Cervical spine WWO contrast 12/13/19  FINDINGS: Alignment: Stable degenerative cervical spondylosis. Mild posterior subluxation of C4 and C5 appears stable.  Vertebrae: Stable changes of diskitis and osteomyelitis at C3-4 and C4-5. There has been further disc space narrowing at C3-4 suggesting further destructive changes of the disc.  Cord: Normal cord signal intensity. Stable mass effect on the cord by the epidural abscess.  Posterior Fossa, vertebral arteries, paraspinal tissues: Again demonstrated is a long epidural abscess extending from the midportion of C2 down to the bottom of C5. This measures approximately 6.5 cm in length and is approximately 1 cm wide. This previously measured 8.9 cm in length and was 1 cm wide. There is also persistent marked inflammatory phlegmon in the prevertebral/retropharyngeal space without discrete rim enhancing abscess.  Disc levels:  C2-3: No disc protrusions or significant foraminal disease. No foraminal stenosis. Stable appearing small epidural abscess at this level. Mild flattening of the thecal sac and obliteration of the anterior CSF space.  C3-4: Again changes of diskitis and osteomyelitis with further destruction of the disc space. The epidural abscess persists but is not is thick with slight decrease and mass effect on the anterior cord.  C4-5: Stable changes of diskitis and osteomyelitis. Small right paracentral epidural abscess appears relatively slightly smaller the  AP diameter is approximately 3.5 mm and was previously 4.3 mm.  C5-6: Stable shallow central disc protrusion and uncinate spurring changes with mass effect on the ventral thecal sac and bilateral foraminal stenosis. The epidural abscess and just above the disc space.  C6-7: Annular bulge and mild facet disease. Slight flattening of the ventral thecal sac and mild foraminal narrowing  bilaterally.  C7-T1: No significant findings.  IMPRESSION: 1. 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 There has been further disc space narrowing at C3-4 suggesting further destructive changes of the disc. 2. Persistent but slightly smaller epidural abscess extending from the midportion of C2 down to the bottom of C5. 3. Persistent marked inflammatory phlegmon in the prevertebral/retropharyngeal space without discrete rim enhancing abscess. 4. Stable degenerative disc disease at C5-6 and C6-7 as above.   All pertinent labs/Imagings/notes reviewed. All pertinent plain films and CT images have been personally visualized and interpreted; radiology reports have been reviewed. Decision making incorporated into the Impression / Recommendations.

## 2020-02-09 ENCOUNTER — Telehealth: Payer: Self-pay | Admitting: *Deleted

## 2020-02-09 ENCOUNTER — Telehealth: Payer: Self-pay

## 2020-02-09 DIAGNOSIS — G061 Intraspinal abscess and granuloma: Secondary | ICD-10-CM

## 2020-02-09 NOTE — Telephone Encounter (Signed)
Do you have his labs like CRP and ESR, CBC and CMP from last few weeks? If yes, could you please forward it to me?   Would you please also check with the patient how is he doing in terms of neck pain/mobility, improvement etc?

## 2020-02-09 NOTE — Telephone Encounter (Signed)
Patient left voicemail in triage stating he was a patient of Dr. Leafy Half and needs a call returned. Returned call and left patient a voicemail to contact [facility] in regards to his voicemail. Valarie Cones

## 2020-02-09 NOTE — Telephone Encounter (Signed)
Patient's IV antibiotics scheduled to end 11/12, no orders at [facility] to pull PICC or to maintain until he is seen in office.  Next office visit scheduled for 11/29. Please advise on PICC. Andree Coss, RN

## 2020-02-10 NOTE — Telephone Encounter (Signed)
Last labs available to view via [facility] are from 10/31. Requested all recent labs from [facility].  Per Amy, pharmacist, BMP redrawn today 11/12 due to some abnormalities.  Will bring rest of labs once they are received. Spoke with patient. He reports new weakness in knees, ankles, mostly right side. He says his finger is less numb, slightly swollen.  He feels he is making progress overall, still working with PT on range of motion and regaining strength.  Regarding his neck, he feels is most often looking at the floor, has to consciously move his head up as he has a hard time looking straight ahead or at a person's face. He states his neck is still a little stiff, but not as painful as before and it has been a week since he felt "the previous pain in the middle of my neck." He feels "about 45% of normal." As [facility] is waiting for new lab results, RN asked to maintain PICC for now.  Patient aware. Andree Coss, RN

## 2020-02-10 NOTE — Telephone Encounter (Signed)
Sounds good michelle. Thanks

## 2020-02-13 MED ORDER — AMOXICILLIN 500 MG PO TABS
1000.0000 mg | ORAL_TABLET | Freq: Three times a day (TID) | ORAL | 0 refills | Status: AC
Start: 2020-02-13 — End: ?

## 2020-02-13 MED ORDER — AMOXICILLIN 500 MG PO TABS: 1000.0000 mg | ORAL_TABLET | Freq: Three times a day (TID) | ORAL | 0 refills | Status: DC

## 2020-02-13 NOTE — Telephone Encounter (Signed)
Did you receive lab results? Do you want to continue to maintain PICC or ok to pull? Andree Coss, RN

## 2020-02-13 NOTE — Addendum Note (Signed)
Addended by: Andree Coss on: 02/13/2020 02:49 PM   Modules accepted: Orders

## 2020-02-13 NOTE — Telephone Encounter (Addendum)
Spoke with Dr Manandhar.  Relayed verbal orders per Dr Manandhar to Debbie at [facility] for ESR, CRP, CMP, CBC to be drawn today. She will advise on PICC pull/IV antibiotic restart after reviewing those labs. Dr Manandhar would like patient to take amoxicillin 1 gm TID until he is seen in clinic. RN updated patient with lab orders, notified him about oral antibiotics.  Sent rx to [facility] in [place] at his request.  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  

## 2020-02-13 NOTE — Telephone Encounter (Signed)
I have not received lab results yet. I want to decide on the PICC after I see the labs

## 2020-02-21 ENCOUNTER — Telehealth: Payer: Self-pay

## 2020-02-21 NOTE — Telephone Encounter (Signed)
Reached out to patient and made aware of plan to remove picc line and continue oral antibiotics as recommended by Dr. Elinor Parkinson. Patient also reminded of upcoming appointment.  Patient verbalized understanding.  Call placed to [facility] and gave verbal order to [facility] to pull picc line per Dr. Elinor Parkinson. Verbal order read back and understood.  Valarie Cones

## 2020-02-21 NOTE — Telephone Encounter (Signed)
-----   Message from Odette Fraction, MD sent at 02/21/2020  7:47 AM EST ----- Regarding: labs Saw his labs, his inflammatory markers have not decreased and are still high. He can continue the oral amoxicillin prescribed earlier 1000mg  three times a day. PICC line can come out. I will see him soon in the clinic and assess him further need for antibiotics or repeat scan of his spine.

## 2020-02-27 ENCOUNTER — Encounter: Payer: Self-pay | Admitting: Infectious Diseases

## 2020-02-27 ENCOUNTER — Ambulatory Visit (INDEPENDENT_AMBULATORY_CARE_PROVIDER_SITE_OTHER): Payer: Medicare Other | Admitting: Infectious Diseases

## 2020-02-27 ENCOUNTER — Other Ambulatory Visit: Payer: Self-pay

## 2020-02-27 VITALS — BP 116/72 | HR 69 | Temp 97.5°F | Resp 16 | Ht 70.0 in | Wt 286.0 lb

## 2020-02-27 DIAGNOSIS — G062 Extradural and subdural abscess, unspecified: Secondary | ICD-10-CM | POA: Diagnosis not present

## 2020-02-27 DIAGNOSIS — G061 Intraspinal abscess and granuloma: Secondary | ICD-10-CM

## 2020-02-27 NOTE — Progress Notes (Signed)
[facility]                                                             [place], [place], [place], [address]                                                                  Phn. [phone]; Fax: [phone]                                                                             Date: 02/27/2020  Reason for Referral: Fu for epidural abscess   Assessment 1. Cervical epidural abscess ( extending from midportion of C2 to C5) 2. C3-C4 and C4-C5 Discitis and Osteomyelitis 3. Strep Intermedius Bacteremia -Patient has received 6 weeks of IV ceftriaxone on 10/18 followed by a repeat MRI on 10/21 which showed that Phlegmon in the prevertebral/retropharyngeal abscess without abscess has significantly improved from prior MRI, down from 6.4*0.9cm --> 1.3*0.7cm. IV ceftriaxone was until 11/12 after which switched to PO amoxicillin  4. Medication monitoiring Pertinent labs  02/06/20 Cr 0.88 WBC 5.2, hb 12.5, platelets 251 02/13/20 Cr 0.95, WBC 7.1, Hb 12.3, platelets 282, ESR 38, CRP 23  Plan Will DC amoxicillin given significant improvement in neurological symptoms with downtrending inflammatory markers ( ESR and CRP) All questions and concerns were discussed and addressed Follow up PRN  I spent greater than 25 minutes with the patient including greater than 50% of time in face to face counsel of the patient and in coordination of their care.   Rosiland Oz, [facility]  Office phone [phone] Fax no. [phone] ______________________________________________________________________________________________________________________ Subjective Patient is here for follow of cervical epidural abscess. Patient has received 6 weeks of IV ceftriaxone on 10/18 followed by a repeat MRI on  10/21 which showed that Phlegmon in the prevertebral/retropharyngeal abscess without abscess has significantly improved from prior MRI, down from 6.4*0.9cm --> 1.3*0.7cm. IV ceftriaxone was until 11/12 after which  switched to PO amoxicillin which he has been taking three times a day.  Feels overall good. Neck pain is improved. Denies any pain/numbness/tingling or weakness in the upper extremities. Denies any fevers, chills and sweats. He will be following up with NSx in  February 2022. He is still on oxygen and says he will be following up with his PCP and Cardiologist for it. Able to walk.   PICC line has been removed. Denies any side effects with the antibiotics except few episodes of loose stool.   ROS: 11 point ROS negative except as stated above   Past Medical History:  Diagnosis Date  . Diabetes mellitus without complication ([place])   . Hypertension   . Pacemaker    Past Surgical History:  Procedure Laterality Date  . BACK SURGERY    . INSERT / REPLACE / REMOVE PACEMAKER  05/2019  . STOMACH SURGERY     Current Outpatient Medications on File Prior to Visit  Medication Sig Dispense Refill  . acetaminophen (TYLENOL) 325 MG tablet Take 2 tablets (650 mg total) by mouth every 6 (six) hours as needed for headache.    . albuterol (PROVENTIL) (2.5 MG/3ML) 0.083% nebulizer solution Take 3 mLs (2.5 mg total) by nebulization every 4 (four) hours as needed for wheezing or shortness of breath.    Marland Kitchen amoxicillin (AMOXIL) 500 MG tablet Take 2 tablets (1,000 mg total) by mouth in the morning, at noon, and at bedtime. 84 tablet 0  . arformoterol (BROVANA) 15 MCG/2ML NEBU Take 2 mLs (15 mcg total) by nebulization 2 (two) times daily.    Marland Kitchen aspirin EC 81 MG tablet Take 81 mg by mouth at bedtime.     Marland Kitchen atorvastatin (LIPITOR) 20 MG tablet Take 20 mg by mouth daily.     Marland Kitchen augmented betamethasone dipropionate (DIPROLENE-AF) 0.05 % cream Apply 1 application topically 2 (two) times daily as needed (to affected  areas).     . bisacodyl (DULCOLAX) 10 MG suppository Place 1 suppository (10 mg total) rectally daily as needed for moderate constipation.    . carvedilol (COREG) 6.25 MG tablet Take 1 tablet (6.25 mg total) by mouth 2 (two) times daily with a meal. Hold for SBP<100 and or HR<60    . cefTRIAXone (ROCEPHIN) IVPB Inject 2 g into the vein daily. Indication:  Streptococcus intermedius bacteremia with epidural abscess First Dose: No Last Day of Therapy:  01/16/2020 Labs - Once weekly:  CBC/D and BMP, Labs - Every other week:  ESR and CRP Method of administration: IV Push 35 Units 0  . diclofenac Sodium (VOLTAREN) 1 % GEL Apply 2-4 g topically 4 (four) times daily as needed (to painful areas of shoulders and neck).    . diphenhydrAMINE (BENADRYL) 25 mg capsule Take 1 capsule (25 mg total) by mouth every 8 (eight) hours as needed for itching or sleep.  0  . fluticasone (FLONASE) 50 MCG/ACT nasal spray Place 2 sprays into both nostrils daily as needed for allergies or rhinitis.     . Fluticasone-Salmeterol (WIXELA INHUB) 250-50 MCG/DOSE AEPB Inhale 1 puff into the lungs in the morning.    . furosemide (LASIX) 40 MG tablet Take 1 tablet (40 mg total) by mouth daily for 5 days. 5 tablet 0  . gabapentin (NEURONTIN) 100 MG capsule Take 1 capsule (100 mg total) by mouth 3 (three) times daily.    . hydrochlorothiazide (HYDRODIURIL) 25 MG tablet Take 1 tablet (25 mg total) by mouth in the morning.    Marland Kitchen  ipratropium-albuterol (DUONEB) 0.5-2.5 (3) MG/3ML SOLN Take 3 mLs by nebulization 2 (two) times daily.    . melatonin 3 MG TABS tablet Take 1 tablet (3 mg total) by mouth at bedtime.  0  . metFORMIN (GLUCOPHAGE-XR) 500 MG 24 hr tablet Take 500 mg by mouth 2 (two) times daily after a meal.     . omeprazole (PRILOSEC) 20 MG capsule Take 20 mg by mouth daily before breakfast.     . polyethylene glycol (MIRALAX / GLYCOLAX) 17 g packet Take 17 g by mouth daily as needed for moderate constipation.    .  Semaglutide,0.25 or 0.5MG/DOS, (OZEMPIC, 0.25 OR 0.5 MG/DOSE,) 2 MG/1.5ML SOPN Inject 0.5 mg into the skin every 7 (seven) days.     Marland Kitchen senna-docusate (SENOKOT-S) 8.6-50 MG tablet Take 2 tablets by mouth 2 (two) times daily.    . traMADol (ULTRAM) 50 MG tablet Take 1 tablet (50 mg total) by mouth once as needed for moderate pain or severe pain. 15 tablet 0  . urea (CARMOL) 40 % CREA Apply 1 application topically daily as needed (for itching/irritation- as directed).     . [DISCONTINUED] amlodipine-benazepril (LOTREL) 2.5-10 MG capsule Take 1 capsule by mouth daily.     No current facility-administered medications on file prior to visit.   Allergies  Allergen Reactions  . Benazepril Swelling and Other (See Comments)    Lips only became swollen  . Ibuprofen Other (See Comments)    Was told by MD to not take this  . Mirabegron Other (See Comments) and Cough    Congestion and wheezing, also  . Amlodipine Swelling and Other (See Comments)    Leg edema   . Cephalexin Itching  . Molds & Smuts Other (See Comments)    "RUNNNING NOSE AND EYES"  . Oxycodone Itching  . Percodan [Oxycodone-Aspirin] Itching   Social History   Socioeconomic History  . Marital status: Married    Spouse name: Not on file  . Number of children: Not on file  . Years of education: Not on file  . Highest education level: Not on file  Occupational History  . Not on file  Tobacco Use  . Smoking status: Never Smoker  . Smokeless tobacco: Never Used  Vaping Use  . Vaping Use: Never used  Substance and Sexual Activity  . Alcohol use: No  . Drug use: Never  . Sexual activity: Not on file  Other Topics Concern  . Not on file  Social History Narrative  . Not on file   Social Determinants of Health   Financial Resource Strain:   . Difficulty of Paying Living Expenses: Not on file  Food Insecurity:   . Worried About Charity fundraiser in the Last Year: Not on file  . Ran Out of Food in the Last Year: Not on  file  Transportation Needs:   . Lack of Transportation (Medical): Not on file  . Lack of Transportation (Non-Medical): Not on file  Physical Activity:   . Days of Exercise per Week: Not on file  . Minutes of Exercise per Session: Not on file  Stress:   . Feeling of Stress : Not on file  Social Connections:   . Frequency of Communication with Friends and Family: Not on file  . Frequency of Social Gatherings with Friends and Family: Not on file  . Attends Religious Services: Not on file  . Active Member of Clubs or Organizations: Not on file  . Attends Archivist  Meetings: Not on file  . Marital Status: Not on file  Intimate Partner Violence:   . Fear of Current or Ex-Partner: Not on file  . Emotionally Abused: Not on file  . Physically Abused: Not on file  . Sexually Abused: Not on file    Vitals BP 116/72   Pulse 69   Temp (!) 97.5 F (36.4 C) (Temporal)   Resp 16   Ht 5' 10" (1.778 m)   Wt 286 lb (129.7 kg)   SpO2 97%   BMI 41.04 kg/m    Examination  General - not in acute distress, comfortably sitting in chair, MORBIDLY OBESE HEENT - PEERLA, no pallor and no icterus, ON NASAL CANNULA 2L Chest - b/l clear air entry, no additional sounds,  CVS- Normal s1s2, RRR Abdomen - Soft, Non tender , non distended, OBESE Ext- no pedal edema Neuro: 5/5 STRENGTH IN BOTH UE AND LE, SENSATION BILATERALLY INTACT Back - WNL Psych : calm and cooperative   Recent labs CBC Latest Ref Rng & Units 12/12/2019 12/11/2019 12/08/2019  WBC 4.0 - 10.5 K/uL 5.7 5.0 4.4  Hemoglobin 13.0 - 17.0 g/dL 10.1(L) 9.7(L) 9.0(L)  Hematocrit 39 - 52 % 33.1(L) 30.8(L) 29.5(L)  Platelets 150 - 400 K/uL 286 263 290   CMP Latest Ref Rng & Units 12/12/2019 12/11/2019 12/10/2019  Glucose 70 - 99 mg/dL 123(H) 121(H) -  BUN 8 - 23 mg/dL 13 8 -  Creatinine 0.61 - 1.24 mg/dL 0.97 0.97 -  Sodium 135 - 145 mmol/L 140 140 -  Potassium 3.5 - 5.1 mmol/L 3.8 3.4(L) 3.6  Chloride 98 - 111 mmol/L 98 96(L) -   CO2 22 - 32 mmol/L 34(H) 35(H) -  Calcium 8.9 - 10.3 mg/dL 9.2 9.1 -  Total Protein 6.5 - 8.1 g/dL - - -  Total Bilirubin 0.3 - 1.2 mg/dL - - -  Alkaline Phos 38 - 126 U/L - - -  AST 15 - 41 U/L - - -  ALT 0 - 44 U/L - - -     Pertinent Microbiology Results for orders placed or performed during the hospital encounter of 11/22/19  Blood culture (routine single)     Status: Abnormal   Collection Time: 11/22/19  3:07 PM   Specimen: BLOOD RIGHT HAND  Result Value Ref Range Status   Specimen Description BLOOD RIGHT HAND  Final   Special Requests   Final    BOTTLES DRAWN AEROBIC AND ANAEROBIC Blood Culture adequate volume   Culture  Setup Time   Final    GRAM POSITIVE COCCI IN BOTH AEROBIC AND ANAEROBIC BOTTLES CRITICAL VALUE NOTED.  VALUE IS CONSISTENT WITH PREVIOUSLY REPORTED AND CALLED VALUE.    Culture (A)  Final    STREPTOCOCCUS INTERMEDIUS SUSCEPTIBILITIES PERFORMED ON PREVIOUS CULTURE WITHIN THE LAST 5 DAYS. Performed at [facility], [place] [address], [place], [place] [address]    Report Status 11/25/2019 FINAL  Final  SARS Coronavirus 2 by RT PCR (hospital order, performed in [facility] hospital lab) Nasopharyngeal Nasopharyngeal Swab     Status: None   Collection Time: 11/22/19  3:36 PM   Specimen: Nasopharyngeal Swab  Result Value Ref Range Status   SARS Coronavirus 2 NEGATIVE NEGATIVE Final    Comment: (NOTE) SARS-CoV-2 target nucleic acids are NOT DETECTED.  The SARS-CoV-2 RNA is generally detectable in upper and lower respiratory specimens during the acute phase of infection. The lowest concentration of SARS-CoV-2 viral copies this assay can detect is 250 copies /  mL. A negative result does not preclude SARS-CoV-2 infection and should not be used as the sole basis for treatment or other patient management decisions.  A negative result may occur with improper specimen collection / handling, submission of specimen other than nasopharyngeal swab, presence of  viral mutation(s) within the areas targeted by this assay, and inadequate number of viral copies (<250 copies / mL). A negative result must be combined with clinical observations, patient history, and epidemiological information.  Fact Sheet for Patients:   StrictlyIdeas.no  Fact Sheet for Healthcare Providers: BankingDealers.co.za  This test is not yet approved or  cleared by the [place] FDA and has been authorized for detection and/or diagnosis of SARS-CoV-2 by FDA under an Emergency Use Authorization (EUA).  This EUA will remain in effect (meaning this test can be used) for the duration of the COVID-19 declaration under Section 564(b)(1) of the Act, 21 U.S.C. section 360bbb-3(b)(1), unless the authorization is terminated or revoked sooner.  Performed at [facility], [place] [address] [place], [place] [address]   Blood culture (routine x 2)     Status: Abnormal   Collection Time: 11/22/19  4:35 PM   Specimen: BLOOD  Result Value Ref Range Status   Specimen Description BLOOD RIGHT ANTECUBITAL  Final   Special Requests   Final    BOTTLES DRAWN AEROBIC AND ANAEROBIC Blood Culture results may not be optimal due to an inadequate volume of blood received in culture bottles   Culture  Setup Time   Final    GRAM POSITIVE COCCI ANAEROBIC BOTTLE ONLY Organism ID to follow CRITICAL RESULT CALLED TO, READ BACK BY AND VERIFIED WITH: Hyman Bible PharmD 16:30 11/23/19 ([email or web address]) Performed at [facility], [place] [address], [place], [place] [address]    Culture STREPTOCOCCUS INTERMEDIUS (A)  Final   Report Status 11/25/2019 FINAL  Final   Organism ID, Bacteria STREPTOCOCCUS INTERMEDIUS  Final      Susceptibility   Streptococcus intermedius - MIC*    PENICILLIN <=0.06 SENSITIVE Sensitive     CEFTRIAXONE <=0.12 SENSITIVE Sensitive     ERYTHROMYCIN >=8 RESISTANT Resistant     LEVOFLOXACIN 0.5 SENSITIVE Sensitive     VANCOMYCIN 0.5  SENSITIVE Sensitive     * STREPTOCOCCUS INTERMEDIUS  Blood Culture ID Panel (Reflexed)     Status: Abnormal   Collection Time: 11/22/19  4:35 PM  Result Value Ref Range Status   Enterococcus faecalis NOT DETECTED NOT DETECTED Final   Enterococcus Faecium NOT DETECTED NOT DETECTED Final   Listeria monocytogenes NOT DETECTED NOT DETECTED Final   Staphylococcus species NOT DETECTED NOT DETECTED Final   Staphylococcus aureus (BCID) NOT DETECTED NOT DETECTED Final   Staphylococcus epidermidis NOT DETECTED NOT DETECTED Final   Staphylococcus lugdunensis NOT DETECTED NOT DETECTED Final   Streptococcus species DETECTED (A) NOT DETECTED Final    Comment: Not Enterococcus species, Streptococcus agalactiae, Streptococcus pyogenes, or Streptococcus pneumoniae. CRITICAL RESULT CALLED TO, READ BACK BY AND VERIFIED WITH: Hyman Bible PharmD 16:30 11/23/19 ([email or web address])    Streptococcus agalactiae NOT DETECTED NOT DETECTED Final   Streptococcus pneumoniae NOT DETECTED NOT DETECTED Final   Streptococcus pyogenes NOT DETECTED NOT DETECTED Final   A.calcoaceticus-baumannii NOT DETECTED NOT DETECTED Final   Bacteroides fragilis NOT DETECTED NOT DETECTED Final   Enterobacterales NOT DETECTED NOT DETECTED Final   Enterobacter cloacae complex NOT DETECTED NOT DETECTED Final   Escherichia coli NOT DETECTED NOT DETECTED Final   Klebsiella aerogenes NOT DETECTED NOT DETECTED Final  Klebsiella oxytoca NOT DETECTED NOT DETECTED Final   Klebsiella pneumoniae NOT DETECTED NOT DETECTED Final   Proteus species NOT DETECTED NOT DETECTED Final   Salmonella species NOT DETECTED NOT DETECTED Final   Serratia marcescens NOT DETECTED NOT DETECTED Final   Haemophilus influenzae NOT DETECTED NOT DETECTED Final   Neisseria meningitidis NOT DETECTED NOT DETECTED Final   Pseudomonas aeruginosa NOT DETECTED NOT DETECTED Final   Stenotrophomonas maltophilia NOT DETECTED NOT DETECTED Final   Candida albicans NOT DETECTED NOT DETECTED  Final   Candida auris NOT DETECTED NOT DETECTED Final   Candida glabrata NOT DETECTED NOT DETECTED Final   Candida krusei NOT DETECTED NOT DETECTED Final   Candida parapsilosis NOT DETECTED NOT DETECTED Final   Candida tropicalis NOT DETECTED NOT DETECTED Final   Cryptococcus neoformans/gattii NOT DETECTED NOT DETECTED Final    Comment: Performed at [facility], [place] [address], [place], [place] [address]  Urine culture     Status: Abnormal   Collection Time: 11/22/19  5:17 PM   Specimen: In/Out Cath Urine  Result Value Ref Range Status   Specimen Description IN/OUT CATH URINE  Final   Special Requests   Final    NONE Performed at [facility], [place] [address], [place], [place] [address]    Culture MULTIPLE SPECIES PRESENT, SUGGEST RECOLLECTION (A)  Final   Report Status 11/23/2019 FINAL  Final  MRSA PCR Screening     Status: None   Collection Time: 11/23/19  7:50 PM   Specimen: Nasal Mucosa; Nasopharyngeal  Result Value Ref Range Status   MRSA by PCR NEGATIVE NEGATIVE Final    Comment:        The GeneXpert MRSA Assay (FDA approved for NASAL specimens only), is one component of a comprehensive MRSA colonization surveillance program. It is not intended to diagnose MRSA infection nor to guide or monitor treatment for MRSA infections. Performed at [facility], [place] [address] [place], [place] [address]   Culture, blood (routine x 2)     Status: None   Collection Time: 11/24/19  4:12 PM   Specimen: BLOOD  Result Value Ref Range Status   Specimen Description BLOOD LEFT ANTECUBITAL  Final   Special Requests   Final    BOTTLES DRAWN AEROBIC AND ANAEROBIC Blood Culture adequate volume   Culture   Final    NO GROWTH 5 DAYS Performed at [facility], [place] [address] [place], [place] [address]    Report Status 11/29/2019 FINAL  Final  Culture, blood (routine x 2)     Status: None   Collection Time: 11/24/19  4:14 PM   Specimen: BLOOD  Result Value Ref  Range Status   Specimen Description BLOOD RIGHT ANTECUBITAL  Final   Special Requests   Final    BOTTLES DRAWN AEROBIC AND ANAEROBIC Blood Culture adequate volume   Culture   Final    NO GROWTH 5 DAYS Performed at [facility], [place] [address] [place], [place] [address]    Report Status 11/29/2019 FINAL  Final  SARS Coronavirus 2 by RT PCR (hospital order, performed in [facility] hospital lab) Nasopharyngeal Nasopharyngeal Swab     Status: None   Collection Time: 12/12/19  2:22 PM   Specimen: Nasopharyngeal Swab  Result Value Ref Range Status   SARS Coronavirus 2 NEGATIVE NEGATIVE Final    Comment: (NOTE) SARS-CoV-2 target nucleic acids are NOT DETECTED.  The SARS-CoV-2 RNA is generally detectable in  upper and lower respiratory specimens during the acute phase of infection. The lowest concentration of SARS-CoV-2 viral copies this assay can detect is 250 copies / mL. A negative result does not preclude SARS-CoV-2 infection and should not be used as the sole basis for treatment or other patient management decisions.  A negative result may occur with improper specimen collection / handling, submission of specimen other than nasopharyngeal swab, presence of viral mutation(s) within the areas targeted by this assay, and inadequate number of viral copies (<250 copies / mL). A negative result must be combined with clinical observations, patient history, and epidemiological information.  Fact Sheet for Patients:   StrictlyIdeas.no  Fact Sheet for Healthcare Providers: BankingDealers.co.za  This test is not yet approved or  cleared by the [place] FDA and has been authorized for detection and/or diagnosis of SARS-CoV-2 by FDA under an Emergency Use Authorization (EUA).  This EUA will remain in effect (meaning this test can be used) for the duration of the COVID-19 declaration under Section 564(b)(1) of the Act, 21 U.S.C. section  360bbb-3(b)(1), unless the authorization is terminated or revoked sooner.  Performed at [facility], [place] [address] [place], [place] [address]     Pertinent Imaging MRI Cervical spine [facility]WO Contrast 01/19/20 FINDINGS: Alignment: Physiologic.  Vertebrae: Interval improvement of the marrow edema at C2-3, C4 and C5 with minimal residual edema. Further progression of C3-4 disc space height loss with erosion of the corresponding endplates.  Cord: Flattening of the anterior cord contour at C3-4 through C6-7, more pronounced at C3-4, without cord signal abnormality.  Posterior Fossa, vertebral arteries, paraspinal tissues: Edema and contrast enhancement of prevertebral musculature from C2 through C5 with decreased thickness when compared to prior.  Anterior epidural abscess and phlegmon appear decreased from prior, extending from the C2-3 disc space to C6. The fluid collection portion measures approximately 1.3 x 0.7 cm, compared to 6.4 x 0.9 cm on prior.  Disc levels:  C2-3: No spinal canal and neural foraminal stenosis.  C3-4: Left paracentral epidural abscess resulting in moderate spinal canal stenosis and causing flattening of the anterior cord contour and severe left neural foraminal stenosis.  C4-5: Small posterior disc protrusion and anterior epidural phlegmon resulting in mild spinal canal stenosis and causing mild flattening of the anterior cord. Uncovertebral and facet degenerative changes resulting in severe right and moderate left neural foraminal narrowing.  C5-6: Small posterior disc protrusion and anterior epidural phlegmon resulting in mild spinal canal stenosis and causing mild flattening of the anterior cord. Uncovertebral and facet degenerative changes resulting in severe bilateral neural foraminal narrowing.  C6-7: Left paracentral disc protrusion with minimal flattening of the anterior cord, without significant spinal canal  stenosis. Uncovertebral and facet degenerative changes resulting in moderate bilateral neural foraminal narrowing.  C7-T1: No spinal canal or neural foraminal stenosis.  IMPRESSION: 1. Interval improvement in prevertebral phlegmon and anterior epidural phlegmon/abscess extending from the C2-3 disc space to C6. Decreased mass effect on the anterior cord. 2. Further progression of C3-4 disc space height loss with erosion of the corresponding endplates. 3. Moderate spinal canal stenosis at C3-4, mild spinal canal stenosis at C4-5 and C5-6. 4. Multilevel neural foraminal stenosis, severe on the left at C3-4 and bilaterally at C5-6.   MRI Cervical spine WWO contrast 12/13/19  FINDINGS: Alignment: Stable degenerative cervical spondylosis. Mild posterior subluxation of C4 and C5 appears stable.  Vertebrae: Stable changes of diskitis and osteomyelitis at C3-4 and C4-5. There has been further disc space  narrowing at C3-4 suggesting further destructive changes of the disc.  Cord: Normal cord signal intensity. Stable mass effect on the cord by the epidural abscess.  Posterior Fossa, vertebral arteries, paraspinal tissues: Again demonstrated is a long epidural abscess extending from the midportion of C2 down to the bottom of C5. This measures approximately 6.5 cm in length and is approximately 1 cm wide. This previously measured 8.9 cm in length and was 1 cm wide. There is also persistent marked inflammatory phlegmon in the prevertebral/retropharyngeal space without discrete rim enhancing abscess.  Disc levels:  C2-3: No disc protrusions or significant foraminal disease. No foraminal stenosis. Stable appearing small epidural abscess at this level. Mild flattening of the thecal sac and obliteration of the anterior CSF space.  C3-4: Again changes of diskitis and osteomyelitis with further destruction of the disc space. The epidural abscess persists but is not is thick with  slight decrease and mass effect on the anterior cord.  C4-5: Stable changes of diskitis and osteomyelitis. Small right paracentral epidural abscess appears relatively slightly smaller the AP diameter is approximately 3.5 mm and was previously 4.3 mm.  C5-6: Stable shallow central disc protrusion and uncinate spurring changes with mass effect on the ventral thecal sac and bilateral foraminal stenosis. The epidural abscess and just above the disc space.  C6-7: Annular bulge and mild facet disease. Slight flattening of the ventral thecal sac and mild foraminal narrowing bilaterally.  C7-T1: No significant findings.  IMPRESSION: 1. 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 There has been further disc space narrowing at C3-4 suggesting further destructive changes of the disc. 2. Persistent but slightly smaller epidural abscess extending from the midportion of C2 down to the bottom of C5. 3. Persistent marked inflammatory phlegmon in the prevertebral/retropharyngeal space without discrete rim enhancing abscess. 4. Stable degenerative disc disease at C5-6 and C6-7 as above.   All pertinent labs/Imagings/notes reviewed. All pertinent plain films and CT images have been personally visualized and interpreted; radiology reports have been reviewed. Decision making incorporated into the Impression / Recommendations.

## 2020-02-28 ENCOUNTER — Telehealth: Payer: Self-pay

## 2020-02-28 DIAGNOSIS — G062 Extradural and subdural abscess, unspecified: Secondary | ICD-10-CM | POA: Insufficient documentation

## 2020-02-28 LAB — CBC
HCT: 37.8 % — ABNORMAL LOW (ref 38.5–50.0)
Hemoglobin: 12.4 g/dL — ABNORMAL LOW (ref 13.2–17.1)
MCH: 30.3 pg (ref 27.0–33.0)
MCHC: 32.8 g/dL (ref 32.0–36.0)
MCV: 92.4 fL (ref 80.0–100.0)
MPV: 10 fL (ref 7.5–12.5)
Platelets: 318 10*3/uL (ref 140–400)
RBC: 4.09 10*6/uL — ABNORMAL LOW (ref 4.20–5.80)
RDW: 12.4 % (ref 11.0–15.0)
WBC: 8.2 10*3/uL (ref 3.8–10.8)

## 2020-02-28 LAB — COMPREHENSIVE METABOLIC PANEL
AG Ratio: 1.2 (calc) (ref 1.0–2.5)
ALT: 16 U/L (ref 9–46)
AST: 14 U/L (ref 10–35)
Albumin: 4 g/dL (ref 3.6–5.1)
Alkaline phosphatase (APISO): 79 U/L (ref 35–144)
BUN: 22 mg/dL (ref 7–25)
CO2: 38 mmol/L — ABNORMAL HIGH (ref 20–32)
Calcium: 10.3 mg/dL (ref 8.6–10.3)
Chloride: 94 mmol/L — ABNORMAL LOW (ref 98–110)
Creat: 0.93 mg/dL (ref 0.70–1.18)
Globulin: 3.3 g/dL (calc) (ref 1.9–3.7)
Glucose, Bld: 114 mg/dL — ABNORMAL HIGH (ref 65–99)
Potassium: 3.6 mmol/L (ref 3.5–5.3)
Sodium: 142 mmol/L (ref 135–146)
Total Bilirubin: 0.5 mg/dL (ref 0.2–1.2)
Total Protein: 7.3 g/dL (ref 6.1–8.1)

## 2020-02-28 LAB — C-REACTIVE PROTEIN: CRP: 8.7 mg/L — ABNORMAL HIGH (ref <8.0)

## 2020-02-28 LAB — SEDIMENTATION RATE: Sed Rate: 31 mm/h — ABNORMAL HIGH (ref 0–20)

## 2020-02-28 NOTE — Telephone Encounter (Signed)
Spoke with the patient to relay results per Dr. West Bali that his CRP and ESR inflammatory markers are down significantly. Informed patient that Dr. West Bali would like for him to finish his two days worth of amoxicillin and that once he finishes that, there is no need for further antibiotics or follow up appointment. Patient verbalized understanding and has no further questions.   Beryle Flock, RN

## 2020-02-28 NOTE — Telephone Encounter (Signed)
-----  Message from Rosiland Oz, MD sent at 02/28/2020  7:44 AM EST ----- Could you please let the patient know that his inflammatory markers CRP and ESR have gone down significantly? He told me that he has only 2 days worth of amoxicillin. He can finish that, no further need for antibiotics after that.

## 2020-02-28 NOTE — Progress Notes (Signed)
Could you please let the patient know that his inflammatory markers CRP and ESR have gone down significantly? He told me that he has only 2 days worth of amoxicillin. He can finish that, no further need for antibiotics after that.

## 2020-02-28 NOTE — Assessment & Plan Note (Signed)
Will DC amoxicillin given improvement in neurological symptoms and downtrending inflammatory markers

## 2021-09-15 ENCOUNTER — Other Ambulatory Visit: Payer: Self-pay | Admitting: Urology

## 2021-10-09 ENCOUNTER — Other Ambulatory Visit: Payer: Self-pay | Admitting: Urology

## 2022-02-27 ENCOUNTER — Other Ambulatory Visit: Payer: Self-pay | Admitting: Urology

## 2022-08-06 IMAGING — MR MR CERVICAL SPINE WO/W CM
6 of 8 series · 28 of 48 positions shown · IV contrast (Eovist)
Comparison: MRI 11/29/2019

CLINICAL DATA: Follow-up epidural abscess.

EXAM:
MRI CERVICAL SPINE WITHOUT AND WITH CONTRAST
TECHNIQUE: Multiplanar and multiecho pulse sequences of the cervical spine, to
include the craniocervical junction and cervicothoracic junction,
were obtained without and with intravenous contrast.
CONTRAST:  10mL GADAVIST GADOBUTROL 1 MMOL/ML IV SOLN

[Series 9: T2 · sagittal · 3.0mm · 0.69mm/px · 3 of 17 slices shown (1 of 2)]
[im 1/17]
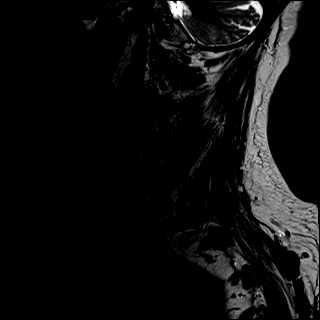
[im 9/17]
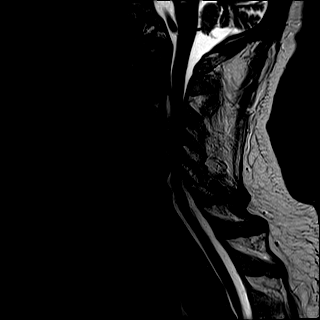
[im 17/17]
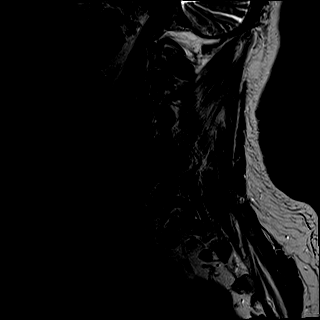

[Series 10: T1 · sagittal · 3.0mm · 0.69mm/px · 3 of 17 slices shown (1 of 2)]
[im 1/17]
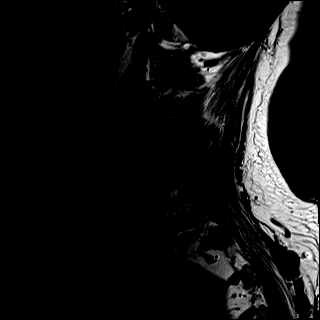
[im 9/17]
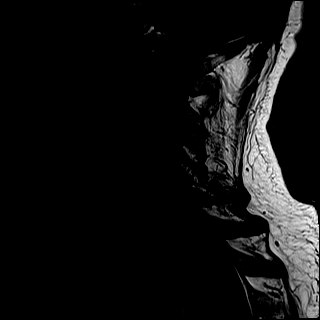
[im 17/17]
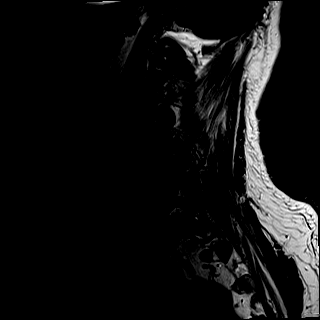

[Series 11: STIR · sagittal · 3.0mm · 0.86mm/px · 1 of 17 slices shown]
[im 1/17]
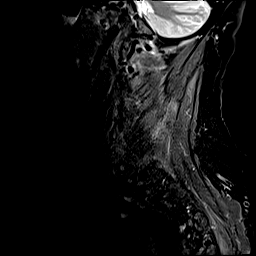

[Series 12: T2 · axial · 3.0mm · 0.66mm/px · z∈[-185,-38]mm · 9 of 50 slices shown (2 of 2)]
[im 1/50]
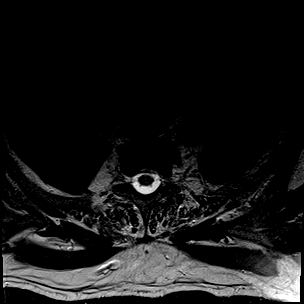
[im 7/50]
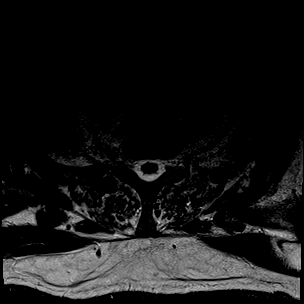
[im 13/50]
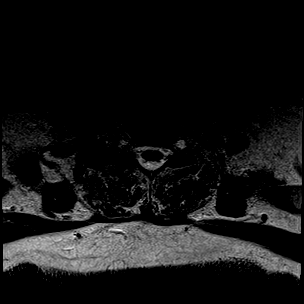
[im 19/50]
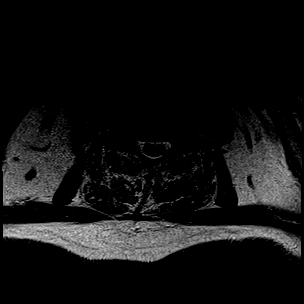
[im 25/50]
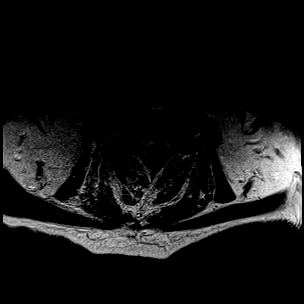
[im 31/50]
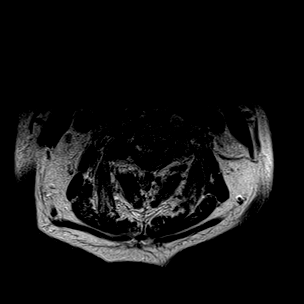
[im 37/50]
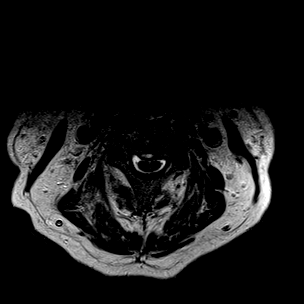
[im 43/50]
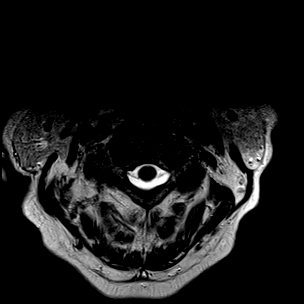
[im 50/50]
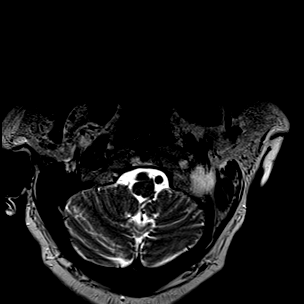

[Series 14: T1 · axial · 3.0mm · 0.39mm/px · z∈[-185,-38]mm · 9 of 50 slices shown (2 of 2)]
[im 1/50]
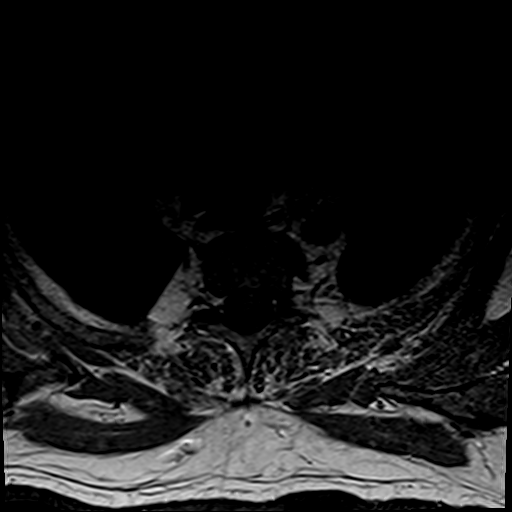
[im 7/50]
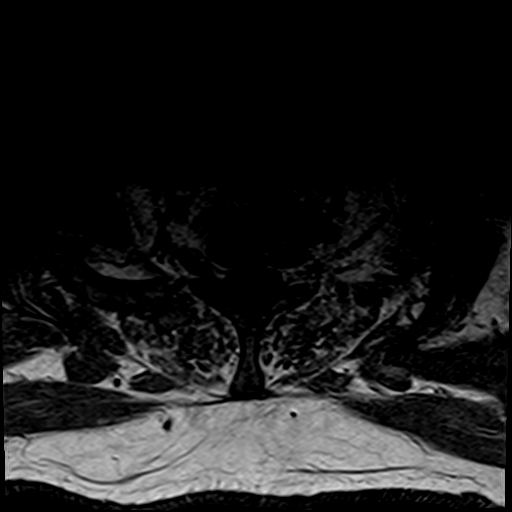
[im 13/50]
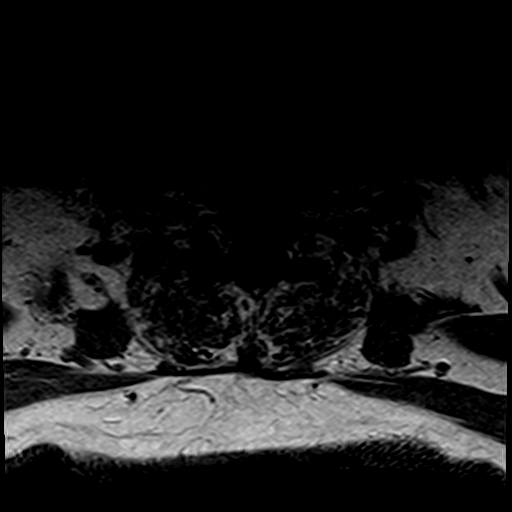
[im 19/50]
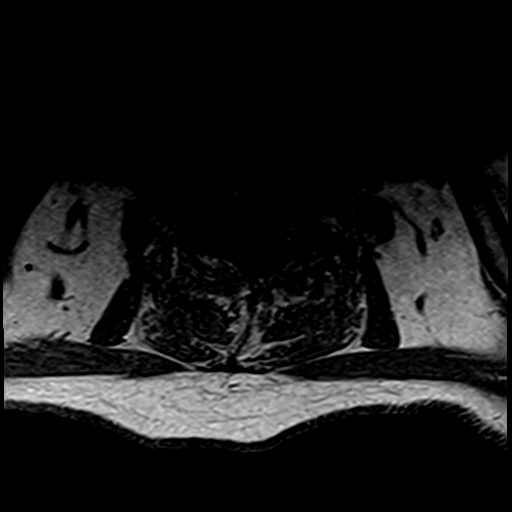
[im 25/50]
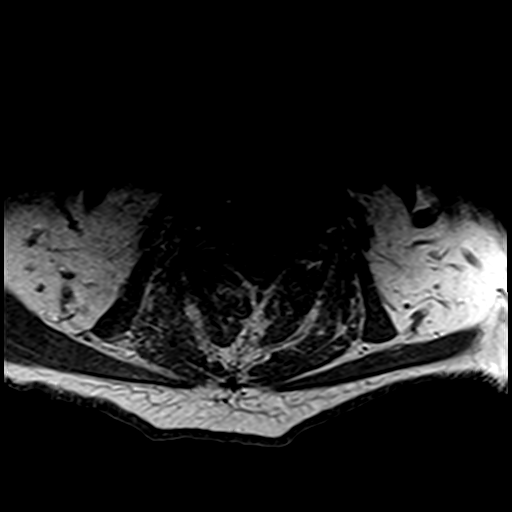
[im 31/50]
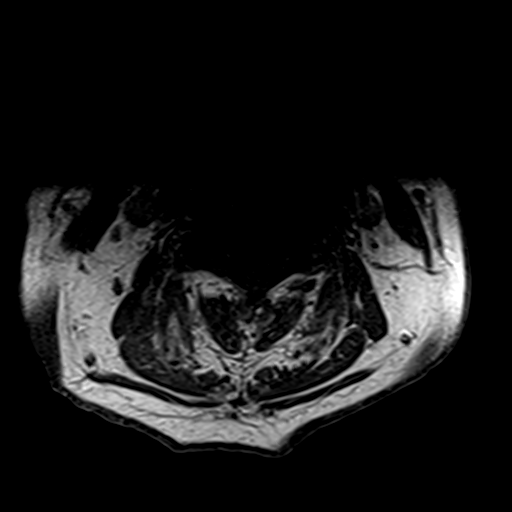
[im 37/50]
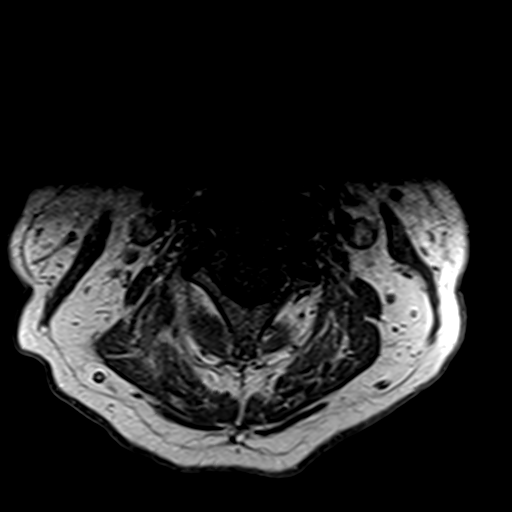
[im 43/50]
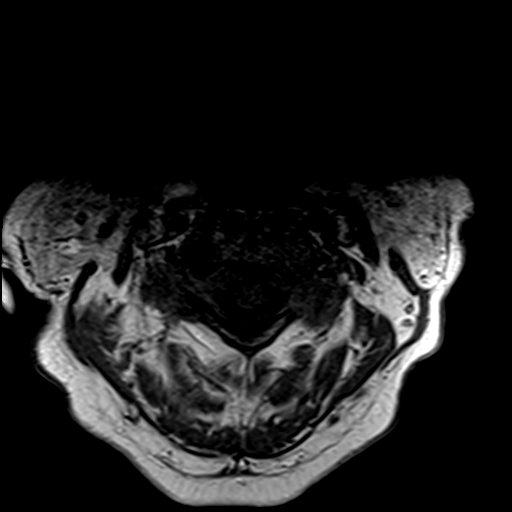
[im 50/50]
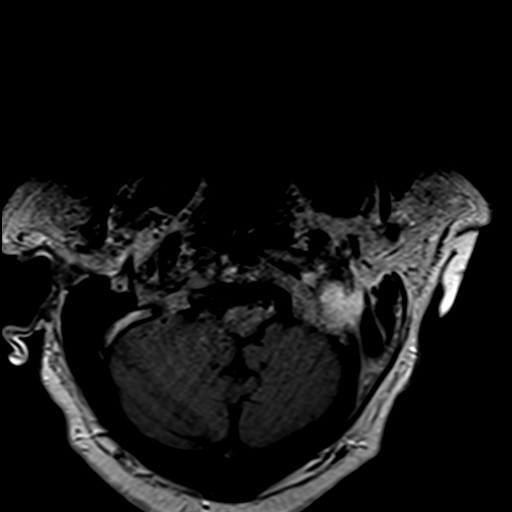

[Series 15: T1 fat-sat post-contrast · sagittal · 3.0mm · 0.43mm/px · 3 of 17 slices shown]
[im 1/17]
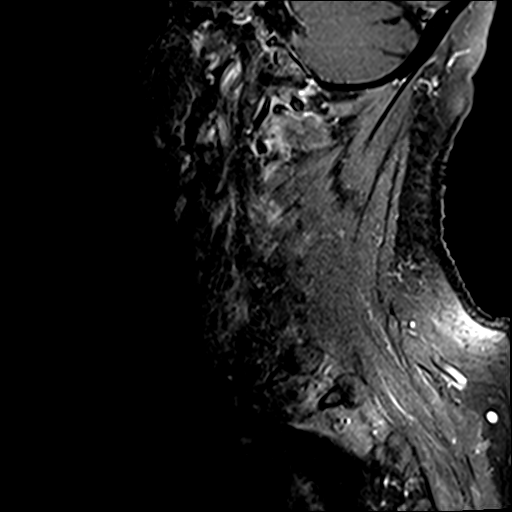
[im 9/17]
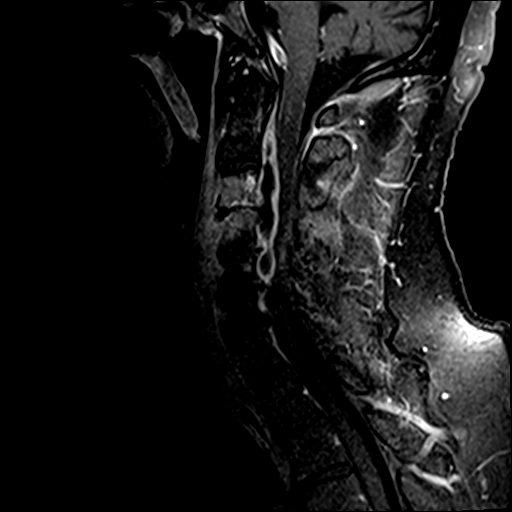
[im 17/17]
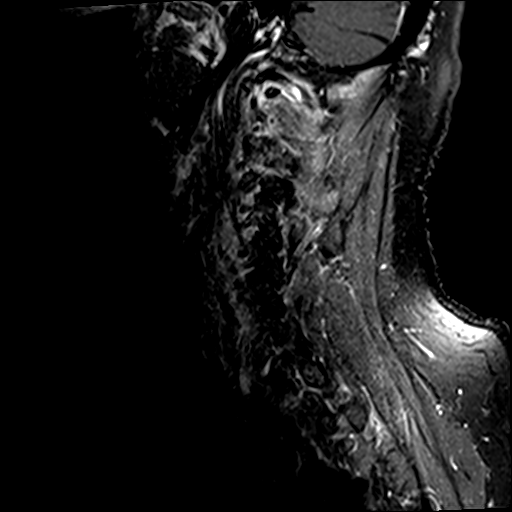

[28 of 48 positions shown; findings below may reference images not displayed]

FINDINGS: Alignment: Stable degenerative cervical spondylosis. Mild posterior
subluxation of C4 and C5 appears stable.

Vertebrae: Stable changes of diskitis and osteomyelitis at C3-4 and
C4-5. There has been further disc space narrowing at C3-4 suggesting
further destructive changes of the disc.

Cord: Normal cord signal intensity. Stable mass effect on the cord
by the epidural abscess.

Posterior Fossa, vertebral arteries, paraspinal tissues: Again
demonstrated is a long epidural abscess extending from the
midportion of C2 down to the bottom of C5. This measures
approximately 6.5 cm in length and is approximately 1 cm wide. This
previously measured 8.9 cm in length and was 1 cm wide. There is
also persistent marked inflammatory phlegmon in the
prevertebral/retropharyngeal space without discrete rim enhancing
abscess.

Disc levels:

C2-3: No disc protrusions or significant foraminal disease. No
foraminal stenosis. Stable appearing small epidural abscess at this
level. Mild flattening of the thecal sac and obliteration of the
anterior CSF space.

C3-4: Again changes of diskitis and osteomyelitis with further
destruction of the disc space. The epidural abscess persists but is
not is thick with slight decrease and mass effect on the anterior
cord.

C4-5: Stable changes of diskitis and osteomyelitis. Small right
paracentral epidural abscess appears relatively slightly smaller the
AP diameter is approximately 3.5 mm and was previously 4.3 mm.

C5-6: Stable shallow central disc protrusion and uncinate spurring
changes with mass effect on the ventral thecal sac and bilateral
foraminal stenosis. The epidural abscess and just above the disc
space.

C6-7: Annular bulge and mild facet disease. Slight flattening of the
ventral thecal sac and mild foraminal narrowing bilaterally.

C7-T1: No significant findings.
IMPRESSION: 1. Stable changes of diskitis and osteomyelitis at C3-4 and C4-5.
There has been further disc space narrowing at C3-4 suggesting
further destructive changes of the disc.
2. Persistent but slightly smaller epidural abscess extending from
the midportion of C2 down to the bottom of C5.
3. Persistent marked inflammatory phlegmon in the
prevertebral/retropharyngeal space without discrete rim enhancing
abscess.
4. Stable degenerative disc disease at C5-6 and C6-7 as above.

## 2022-08-31 ENCOUNTER — Other Ambulatory Visit: Payer: Self-pay | Admitting: Urology

## 2022-09-28 ENCOUNTER — Other Ambulatory Visit: Payer: Self-pay | Admitting: Urology
